# Patient Record
Sex: Female | Born: 1955 | Race: White | Hispanic: No | Marital: Married | Smoking: Never smoker
Health system: Southern US, Community
[De-identification: ages and names within clinical notes are randomized; demographics above are authoritative.]

## PROBLEM LIST (undated history)

## (undated) DIAGNOSIS — I5189 Other ill-defined heart diseases: Secondary | ICD-10-CM

## (undated) DIAGNOSIS — I10 Essential (primary) hypertension: Secondary | ICD-10-CM

## (undated) DIAGNOSIS — R7303 Prediabetes: Secondary | ICD-10-CM

## (undated) DIAGNOSIS — I6789 Other cerebrovascular disease: Secondary | ICD-10-CM

## (undated) DIAGNOSIS — R112 Nausea with vomiting, unspecified: Secondary | ICD-10-CM

## (undated) DIAGNOSIS — N3281 Overactive bladder: Secondary | ICD-10-CM

## (undated) DIAGNOSIS — I4891 Unspecified atrial fibrillation: Secondary | ICD-10-CM

## (undated) DIAGNOSIS — I480 Paroxysmal atrial fibrillation: Secondary | ICD-10-CM

## (undated) DIAGNOSIS — K219 Gastro-esophageal reflux disease without esophagitis: Secondary | ICD-10-CM

## (undated) DIAGNOSIS — I639 Cerebral infarction, unspecified: Secondary | ICD-10-CM

## (undated) DIAGNOSIS — R519 Headache, unspecified: Secondary | ICD-10-CM

## (undated) DIAGNOSIS — F419 Anxiety disorder, unspecified: Secondary | ICD-10-CM

## (undated) DIAGNOSIS — J45909 Unspecified asthma, uncomplicated: Secondary | ICD-10-CM

## (undated) DIAGNOSIS — Z7901 Long term (current) use of anticoagulants: Secondary | ICD-10-CM

## (undated) DIAGNOSIS — I700 Atherosclerosis of aorta: Secondary | ICD-10-CM

## (undated) DIAGNOSIS — G459 Transient cerebral ischemic attack, unspecified: Secondary | ICD-10-CM

## (undated) DIAGNOSIS — N8110 Cystocele, unspecified: Secondary | ICD-10-CM

## (undated) DIAGNOSIS — R600 Localized edema: Secondary | ICD-10-CM

## (undated) DIAGNOSIS — Z79899 Other long term (current) drug therapy: Secondary | ICD-10-CM

## (undated) DIAGNOSIS — K259 Gastric ulcer, unspecified as acute or chronic, without hemorrhage or perforation: Secondary | ICD-10-CM

## (undated) DIAGNOSIS — Z9889 Other specified postprocedural states: Secondary | ICD-10-CM

## (undated) DIAGNOSIS — I471 Supraventricular tachycardia, unspecified: Secondary | ICD-10-CM

## (undated) DIAGNOSIS — G43B0 Ophthalmoplegic migraine, not intractable: Secondary | ICD-10-CM

## (undated) DIAGNOSIS — H409 Unspecified glaucoma: Secondary | ICD-10-CM

## (undated) DIAGNOSIS — K449 Diaphragmatic hernia without obstruction or gangrene: Secondary | ICD-10-CM

## (undated) DIAGNOSIS — F32A Depression, unspecified: Secondary | ICD-10-CM

## (undated) DIAGNOSIS — J189 Pneumonia, unspecified organism: Secondary | ICD-10-CM

## (undated) DIAGNOSIS — D649 Anemia, unspecified: Secondary | ICD-10-CM

## (undated) DIAGNOSIS — I499 Cardiac arrhythmia, unspecified: Secondary | ICD-10-CM

## (undated) DIAGNOSIS — K760 Fatty (change of) liver, not elsewhere classified: Secondary | ICD-10-CM

## (undated) HISTORY — DX: Cystocele, unspecified: N81.10

## (undated) HISTORY — PX: CYSTECTOMY: SUR359

## (undated) HISTORY — DX: Prediabetes: R73.03

## (undated) HISTORY — PX: ESOPHAGOGASTRODUODENOSCOPY: SHX1529

## (undated) HISTORY — PX: OTHER SURGICAL HISTORY: SHX169

## (undated) HISTORY — PX: NASAL SEPTUM SURGERY: SHX37

## (undated) HISTORY — DX: Unspecified glaucoma: H40.9

## (undated) HISTORY — DX: Essential (primary) hypertension: I10

## (undated) HISTORY — DX: Gastric ulcer, unspecified as acute or chronic, without hemorrhage or perforation: K25.9

## (undated) HISTORY — PX: APPENDECTOMY: SHX54

## (undated) HISTORY — DX: Overactive bladder: N32.81

## (undated) HISTORY — PX: EYE SURGERY: SHX253

---

## 1991-07-12 HISTORY — PX: ABDOMINAL HYSTERECTOMY: SHX81

## 1992-07-11 HISTORY — PX: LOWER LEG SOFT TISSUE TUMOR EXCISION: SUR553

## 1998-02-11 ENCOUNTER — Ambulatory Visit (HOSPITAL_COMMUNITY): Admission: RE | Admit: 1998-02-11 | Discharge: 1998-02-11 | Payer: Self-pay | Admitting: Gastroenterology

## 1999-01-28 ENCOUNTER — Ambulatory Visit (HOSPITAL_COMMUNITY): Admission: RE | Admit: 1999-01-28 | Discharge: 1999-01-28 | Payer: Self-pay | Admitting: Orthopedic Surgery

## 1999-01-28 ENCOUNTER — Encounter: Payer: Self-pay | Admitting: Orthopedic Surgery

## 1999-07-14 ENCOUNTER — Other Ambulatory Visit: Admission: RE | Admit: 1999-07-14 | Discharge: 1999-07-14 | Payer: Self-pay | Admitting: Obstetrics & Gynecology

## 1999-09-27 ENCOUNTER — Encounter (INDEPENDENT_AMBULATORY_CARE_PROVIDER_SITE_OTHER): Payer: Self-pay

## 1999-09-28 ENCOUNTER — Inpatient Hospital Stay (HOSPITAL_COMMUNITY): Admission: EM | Admit: 1999-09-28 | Discharge: 1999-09-30 | Payer: Self-pay | Admitting: Emergency Medicine

## 2000-03-27 ENCOUNTER — Encounter: Payer: Self-pay | Admitting: Family Medicine

## 2000-03-27 ENCOUNTER — Ambulatory Visit (HOSPITAL_COMMUNITY): Admission: RE | Admit: 2000-03-27 | Discharge: 2000-03-27 | Payer: Self-pay | Admitting: Family Medicine

## 2001-02-23 ENCOUNTER — Other Ambulatory Visit: Admission: RE | Admit: 2001-02-23 | Discharge: 2001-02-23 | Payer: Self-pay | Admitting: Family Medicine

## 2004-04-16 ENCOUNTER — Ambulatory Visit (HOSPITAL_COMMUNITY): Admission: RE | Admit: 2004-04-16 | Discharge: 2004-04-16 | Payer: Self-pay | Admitting: Family Medicine

## 2004-07-10 ENCOUNTER — Emergency Department (HOSPITAL_COMMUNITY): Admission: EM | Admit: 2004-07-10 | Discharge: 2004-07-10 | Payer: Self-pay | Admitting: Family Medicine

## 2004-08-31 ENCOUNTER — Other Ambulatory Visit: Admission: RE | Admit: 2004-08-31 | Discharge: 2004-08-31 | Payer: Self-pay | Admitting: Obstetrics & Gynecology

## 2008-03-07 ENCOUNTER — Encounter: Admission: RE | Admit: 2008-03-07 | Discharge: 2008-03-07 | Payer: Self-pay | Admitting: Family Medicine

## 2008-07-18 ENCOUNTER — Ambulatory Visit: Payer: Self-pay | Admitting: Pulmonary Disease

## 2008-07-18 ENCOUNTER — Telehealth (INDEPENDENT_AMBULATORY_CARE_PROVIDER_SITE_OTHER): Payer: Self-pay | Admitting: *Deleted

## 2008-07-18 DIAGNOSIS — H409 Unspecified glaucoma: Secondary | ICD-10-CM | POA: Insufficient documentation

## 2008-07-18 DIAGNOSIS — J45909 Unspecified asthma, uncomplicated: Secondary | ICD-10-CM | POA: Insufficient documentation

## 2008-07-18 DIAGNOSIS — K219 Gastro-esophageal reflux disease without esophagitis: Secondary | ICD-10-CM | POA: Insufficient documentation

## 2008-07-21 ENCOUNTER — Ambulatory Visit: Payer: Self-pay | Admitting: Cardiology

## 2008-08-13 ENCOUNTER — Ambulatory Visit: Payer: Self-pay | Admitting: Internal Medicine

## 2008-08-13 DIAGNOSIS — R059 Cough, unspecified: Secondary | ICD-10-CM | POA: Insufficient documentation

## 2008-08-13 DIAGNOSIS — R05 Cough: Secondary | ICD-10-CM | POA: Insufficient documentation

## 2008-08-22 ENCOUNTER — Telehealth (INDEPENDENT_AMBULATORY_CARE_PROVIDER_SITE_OTHER): Payer: Self-pay | Admitting: *Deleted

## 2008-08-27 ENCOUNTER — Ambulatory Visit: Payer: Self-pay | Admitting: Internal Medicine

## 2008-09-01 ENCOUNTER — Encounter: Payer: Self-pay | Admitting: Internal Medicine

## 2008-11-17 ENCOUNTER — Ambulatory Visit: Payer: Self-pay | Admitting: Internal Medicine

## 2008-11-17 DIAGNOSIS — J310 Chronic rhinitis: Secondary | ICD-10-CM | POA: Insufficient documentation

## 2010-01-06 ENCOUNTER — Telehealth: Payer: Self-pay | Admitting: Internal Medicine

## 2010-02-05 ENCOUNTER — Ambulatory Visit: Payer: Self-pay | Admitting: Internal Medicine

## 2010-08-10 NOTE — Progress Notes (Signed)
Summary: nos appt  Phone Note Call from Patient   Caller: [email or web address]  Call For: Aiya Keach Summary of Call: Rsc nos from 6/28 to 7/29 @ 11:45a. Initial call taken by: Darletta Moll,  January 06, 2010 10:34 AM

## 2010-08-10 NOTE — Assessment & Plan Note (Signed)
Summary: Pulmonary/ ext f/u with hfa teaching 50%   Copy to:  Dr. Elvera Lennox Primary Provider/Referring Provider:  Dr. Elvera Lennox  CC:  Followup for medication refill.  Pt states that her breathing has been great and denies any complaints today.Brandy Love  History of Present Illness: 89  yowf never smoker with episodes of bronchitis every winter and maybe in the fall all her adult life but never any chronic problems and in between spells did great with no meds,  previously  spells rx clariton, short course prednisone, otc allergy drugs abx resolve after 1-4 week but gained 50 lbs since 2006.   July 18, 2008 ov to eval difficulty breathing assoc with severe  persitent  rattling cough  that developed abruptly while at [place]  01/12/08  after traveled by car assoc with nasal congestion and overt acid heartburn and had to double aciphex helps some with wu at [facility] with barium swallow neg .  symptoms  worse with cold air but also while sleeping but not exacerbating early in am.   Pos sinus ct rx augmentin x 3 week and start symbicort 160 > reduced to 80 2bid once better.  Nov 17, 2008 cc one week h/o sneezing and  dry coughing while picking strawberries then started green mucus 5/9.  also noted increase sob and  need for rescue rx including neb one day prior to ov. comes to ov with empty symbicort 160 but thinks she's been using the 80 at home.  rec 80 2 puffs first thing  in am and 2 puffs again in pm about 12 hours later and work on hfa technique  February 06, 2010 Followup for medication refill.  Pt states that her breathing has been great and denies any complaints today. Pt denies any significant sore throat, dysphagia, itching, sneezing,  nasal congestion or excess secretions,  fever, chills, sweats, unintended wt loss, pleuritic or exertional cp, hempoptysis, change in activity tolerance  orthopnea pnd or leg swelling. Pt also denies any obvious fluctuation in symptoms with weather or environmental  change or other alleviating or aggravating factors.        Current Medications (verified): 1)  Cymbalta 60 Mg Cpep (Duloxetine Hcl) .Brandy Love.. 1 Once Daily 2)  Dexilant 60 Mg Cpdr (Dexlansoprazole) .Brandy Love.. 1 Once Daily 3)  Lumigan 0.01 % Soln (Bimatoprost) .Brandy Love.. 1 Drop Each Eye At Bedtime 4)  Symbicort 80-4.5 Mcg/act  Aero (Budesonide-Formoterol Fumarate) .... 2 Puffs First Thing  in Am and 2 Puffs Again in Pm About 12 Hours Later 5)  Hydrochlorothiazide 12.5 Mg Caps (Hydrochlorothiazide) .Brandy Love.. 1 Once Daily 6)  Xanax 0.5 Mg Tabs (Alprazolam) .Brandy Love.. 1 Once Daily As Needed Anxiety 7)  Xopenex 1.25 Mg/40ml Nebu (Levalbuterol Hcl) .Brandy Love.. 1 Vial in Nebulizer Every 4 Hours If Needed 8)  Benzonatate 200 Mg Caps (Benzonatate) .Brandy Love.. 1 Once Daily As Needed For Cough 9)  Proventil Hfa 108 (90 Base) Mcg/act Aers (Albuterol Sulfate) .... 2 Puffs Every 4 Hours As Needed  Allergies (verified): 1)  ! Iodine 2)  ! Motrin  Past History:  Past Medical History: ASTHMA (ICD-493.90)   - HFA 50% effective Nov 17, 2008  > February 05, 2010 =50% GLAUCOMA, BILATERAL (ICD-365.9) Sinusitis    - Sinus CT pos 07/21/2008    - ENT eval Pollyann Kennedy 09/01/08 rec nasal steroids and saline prn  Vital Signs:  Patient profile:   55 year old female Weight:      253.50 pounds BMI:     39.85 O2  Sat:      99 % on Room air Temp:     98.1 degrees F oral Pulse rate:   91 / minute BP sitting:   144 / 90  (left arm) Cuff size:   large  Vitals Entered By: Vernie Murders (February 05, 2010 11:53 AM)  O2 Flow:  Room air  Physical Exam  Additional Exam:  wt  246 July 18, 2008  > 247 August 13, 2008 > 250 08/27/08 > 247 Nov 17, 2008 > 253 February 06, 2010  Ambulatory minimally anxious wf  in no acute distress   HEENT: nl dentition, mild nonspecific swelling of both nasal turbinates, and nl  orophanx. Nl external ear canals without cough reflex Neck without JVD/Nodes/TM Lungs trace late exp wheeze  bilaterally without cough on insp or exp maneuvers    RRR no s3 or murmur or increase in P2 Abd soft and benign with nl excursion in the supine position. No bruits or organomegaly Ext warm without calf tenderness, cyanosis clubbing or edema Skin warm and dry without lesions     Impression & Recommendations:  Problem # 1:  ASTHMA (ICD-493.90) All goals of asthma met including optimal function and elimination of symptoms with minimum need for rescue therapy. Contingencies discussed today including the rule of two's.   I spent extra time with the patient today explaining optimal mdi  technique.  This improved from  25-50% but no better with coaching - since she's doing so well though is probably getting adequate delivery so no change in rx  Medications Added to Medication List This Visit: 1)  Cymbalta 60 Mg Cpep (Duloxetine hcl) .Brandy Love.. 1 once daily 2)  Dexilant 60 Mg Cpdr (Dexlansoprazole) .Brandy Love.. 1 once daily 3)  Lumigan 0.01 % Soln (Bimatoprost) .Brandy Love.. 1 drop each eye at bedtime  Other Orders: Est. Patient Level IV (57846)  Patient Instructions: 1)  Work on inhaler technique:  relax and blow all the way out then take a nice smooth deep breath back in, triggering the inhaler at same time you start breathing in and hold for about 5 secs and then brush teeth and gargle 2)  Symbicort 80 2 puffs first thing  in am and 2 puffs again in pm about 12 hours later  3)  If your breathing worsens or you need to use your rescue inhaler more than twice weekly or wake up more than twice a month with any respiratory symptoms or require more than two rescue inhalers per year, we need to see you right away. 4)    Prescriptions: SYMBICORT 80-4.5 MCG/ACT  AERO (BUDESONIDE-FORMOTEROL FUMARATE) 2 puffs first thing  in am and 2 puffs again in pm about 12 hours later  #1 x 11   Entered and Authorized by:   Nyoka Cowden MD   Signed by:   Nyoka Cowden MD on 02/05/2010   Method used:   Electronically to        [facility]* (retail)       [address] [place], [place]  [address]       Ph: [phone] or [phone]       Fax: [phone]   RxID:   [id number]

## 2010-09-07 ENCOUNTER — Emergency Department (HOSPITAL_COMMUNITY): Payer: 59

## 2010-09-07 ENCOUNTER — Emergency Department (HOSPITAL_COMMUNITY)
Admission: EM | Admit: 2010-09-07 | Discharge: 2010-09-07 | Disposition: A | Discharge status: Home / Self Care | Payer: 59 | Attending: Emergency Medicine | Admitting: Emergency Medicine

## 2010-09-07 DIAGNOSIS — R197 Diarrhea, unspecified: Secondary | ICD-10-CM | POA: Insufficient documentation

## 2010-09-07 DIAGNOSIS — R000 Tachycardia, unspecified: Secondary | ICD-10-CM | POA: Insufficient documentation

## 2010-09-07 DIAGNOSIS — R1011 Right upper quadrant pain: Secondary | ICD-10-CM | POA: Insufficient documentation

## 2010-09-07 DIAGNOSIS — H409 Unspecified glaucoma: Secondary | ICD-10-CM | POA: Insufficient documentation

## 2010-09-07 DIAGNOSIS — E669 Obesity, unspecified: Secondary | ICD-10-CM | POA: Insufficient documentation

## 2010-09-07 DIAGNOSIS — J45909 Unspecified asthma, uncomplicated: Secondary | ICD-10-CM | POA: Insufficient documentation

## 2010-09-07 DIAGNOSIS — R112 Nausea with vomiting, unspecified: Secondary | ICD-10-CM | POA: Insufficient documentation

## 2010-09-07 LAB — DIFFERENTIAL
Basophils Absolute: 0 10*3/uL (ref 0.0–0.1)
Basophils Relative: 0 % (ref 0–1)
Eosinophils Absolute: 0 10*3/uL (ref 0.0–0.7)
Eosinophils Relative: 0 % (ref 0–5)
Monocytes Absolute: 0.5 10*3/uL (ref 0.1–1.0)
Monocytes Relative: 8 % (ref 3–12)
Neutro Abs: 5.2 10*3/uL (ref 1.7–7.7)

## 2010-09-07 LAB — URINALYSIS, ROUTINE W REFLEX MICROSCOPIC
Hgb urine dipstick: NEGATIVE
Ketones, ur: NEGATIVE mg/dL
Leukocytes, UA: NEGATIVE
Nitrite: NEGATIVE
Protein, ur: 30 mg/dL — AB
Specific Gravity, Urine: 1.024 (ref 1.005–1.030)
Urine Glucose, Fasting: NEGATIVE mg/dL
Urobilinogen, UA: 0.2 mg/dL (ref 0.0–1.0)
pH: 6 (ref 5.0–8.0)

## 2010-09-07 LAB — URINE MICROSCOPIC-ADD ON

## 2010-09-07 LAB — CBC
Hemoglobin: 12.9 g/dL (ref 12.0–15.0)
MCH: 26.3 pg (ref 26.0–34.0)
MCHC: 33 g/dL (ref 30.0–36.0)
RDW: 14.7 % (ref 11.5–15.5)

## 2010-09-07 LAB — COMPREHENSIVE METABOLIC PANEL
AST: 22 U/L (ref 0–37)
CO2: 26 mEq/L (ref 19–32)
Calcium: 8.9 mg/dL (ref 8.4–10.5)
Creatinine, Ser: 0.81 mg/dL (ref 0.4–1.2)
GFR calc Af Amer: >60 mL/min (ref >60)
GFR calc non Af Amer: >60 mL/min (ref >60)
Sodium: 137 mEq/L (ref 135–145)
Total Protein: 6.8 g/dL (ref 6.0–8.3)

## 2010-09-07 LAB — LIPASE, BLOOD: Lipase: 19 U/L (ref 11–59)

## 2012-03-30 ENCOUNTER — Other Ambulatory Visit: Payer: Self-pay | Admitting: Urology

## 2012-04-06 ENCOUNTER — Encounter (HOSPITAL_BASED_OUTPATIENT_CLINIC_OR_DEPARTMENT_OTHER): Payer: Self-pay | Admitting: *Deleted

## 2012-04-06 NOTE — Progress Notes (Signed)
To [facility] at 0615-Npo after mn- Istat,possible Ekg on arrival-will take symbicort,dexalan with sip water that am-to bring rescue inhaler.

## 2012-04-09 NOTE — H&P (Signed)
History of Present Illness   [name] has mixed stress urge incontinence but a more predominant urge component with frequency. She was 80-90% better with a PNE on the right side. She said she only leaked once. She is going less frequently. She does have a stable cystocele.  Review of systems: No change in bowel or neurologic status.   Today her dressing was removed as were the leads. She had a little bit of irritation from the dressing. Review of systems: No change in bowel or neurologic status.    Past Medical History Problems  1. History of  Allergic Rhinitis 477.9 2. History of  Asthma 493.90 3. 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 History of  Edema 782.3 5. History of  Esophageal Reflux 530.81 6. History of  Glaucoma 365.9 7. 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 History of  Hyperlipidemia 272.4 9. History of  Obesity 278.00  Surgical History Problems  1. History of  Ankle Surgery 2. History of  Appendectomy 3. History of  Cesarean Section 4. History of  Hysterectomy V45.77  Current Meds 1. Cephalexin 500 MG Oral Capsule; TAKE 1 CAPSULE 3 TIMES DAILY; Therapy: 02Aug2013 to  (Evaluate:05Aug2013)  Requested for: 02Aug2013; Last Rx:02Aug2013 2. Dexilant 60 MG Oral Capsule Delayed Release; Therapy: 10Apr2013 to 3. Ditropan XL 10 MG TBCR; Therapy: (Recorded:10Jun2013) to 4. Hydrochlorothiazide 12.5 MG Oral Capsule; Therapy: 27Feb2013 to 5. HydrOXYzine HCl 10 MG Oral Tablet; Therapy: (Recorded:10Jun2013) to 6. Latanoprost 0.005 % Ophthalmic Solution; Therapy: 30Jan2013 to 7. Oxybutynin Chloride ER 10 MG Oral Tablet Extended Release 24 Hour; Therapy: 27Feb2013 to 8. Proventil HFA 108 (90 Base) MCG/ACT Inhalation Aerosol Solution; Therapy:  (Recorded:10Jun2013) to 9. Sertraline HCl 100 MG Oral Tablet; Therapy: 25Sep2012 to 10. Symbicort 80-4.5 MCG/ACT Inhalation Aerosol; Therapy: 10Apr2013 to 11. Toviaz 8 MG Oral Tablet Extended Release 24 Hour; Therapy:  28May2013 to  Allergies Medication  1. Erythromycin Derivatives 2. Shellfish-derived Products  Family History Problems  1. Maternal history of  Asthma V17.5 2. Paternal history of  Cataract 3. Maternal history of  Diabetes Mellitus V18.0 4. Paternal history of  Heart Disease V17.49 5. Paternal history of  Prostate Cancer V16.42  Social History Problems  1. Former Smoker V15.82 2. 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 History of  Tobacco Use  Vitals Vital Signs [Data Includes: Last 1 Day]  17Sep2013 01:16PM  Blood Pressure: 146 / 80 Temperature: 98.5 F Heart Rate: 78  Assessment Assessed  1. Cystocele 596.89 2. Urge And Stress Incontinence 788.33  Plan Urinary Frequency (788.41)  1. Hydrocortisone 0.5 % External Cream; APPLY SPARINGLY TO AFFECTED AREA(S) TWICE  DAILY; Therapy: 17Sep2013 to (Last Rx:17Sep2013)  Discussion/Summary   [name] would like to proceed with the Interstim. Hopefully it will reach her long-term treatment goals.   [name] may have had a little reaction to the adhesive tape. Gave her Hydrocortisone cream 0.5% to be used if necessary. We will schedule her for the Interstim.  After a thorough review of the management options for the patient's condition the patient  elected to proceed with surgical therapy as noted above. We have discussed the potential benefits and risks of the procedure, side effects of the proposed treatment, the likelihood of the patient achieving the goals of the procedure, and any potential problems that might occur during the procedure or recuperation. Informed consent has been obtained.

## 2012-04-09 NOTE — Anesthesia Preprocedure Evaluation (Addendum)
Anesthesia Evaluation  Patient identified by MRN, date of birth, ID band Patient awake    Reviewed: Allergy & Precautions, H&P , NPO status , Patient's Chart, lab work & pertinent test results  Airway Mallampati: II TM Distance: >3 FB Neck ROM: Full    Dental No notable dental hx.    Pulmonary asthma ,  breath sounds clear to auscultation  Pulmonary exam normal       Cardiovascular negative cardio ROS  Rhythm:Regular Rate:Normal     Neuro/Psych negative neurological ROS  negative psych ROS   GI/Hepatic Neg liver ROS, GERD-  ,  Endo/Other  Morbid obesity  Renal/GU negative Renal ROS  negative genitourinary   Musculoskeletal negative musculoskeletal ROS (+)   Abdominal (+) + obese,   Peds negative pediatric ROS (+)  Hematology negative hematology ROS (+)   Anesthesia Other Findings   Reproductive/Obstetrics negative OB ROS                           Anesthesia Physical Anesthesia Plan  ASA: III  Anesthesia Plan: MAC   Post-op Pain Management:    Induction:   Airway Management Planned:   Additional Equipment:   Intra-op Plan:   Post-operative Plan: Extubation in OR  Informed Consent: I have reviewed the patients History and Physical, chart, labs and discussed the procedure including the risks, benefits and alternatives for the proposed anesthesia with the patient or authorized representative who has indicated his/her understanding and acceptance.   Dental advisory given  Plan Discussed with: CRNA  Anesthesia Plan Comments:        Anesthesia Quick Evaluation

## 2012-04-10 ENCOUNTER — Encounter (HOSPITAL_BASED_OUTPATIENT_CLINIC_OR_DEPARTMENT_OTHER): Payer: Self-pay | Admitting: Anesthesiology

## 2012-04-10 ENCOUNTER — Ambulatory Visit (HOSPITAL_COMMUNITY): Payer: 59

## 2012-04-10 ENCOUNTER — Encounter (HOSPITAL_BASED_OUTPATIENT_CLINIC_OR_DEPARTMENT_OTHER): Payer: Self-pay | Admitting: *Deleted

## 2012-04-10 ENCOUNTER — Ambulatory Visit (HOSPITAL_BASED_OUTPATIENT_CLINIC_OR_DEPARTMENT_OTHER): Payer: 59 | Admitting: Anesthesiology

## 2012-04-10 ENCOUNTER — Encounter (HOSPITAL_BASED_OUTPATIENT_CLINIC_OR_DEPARTMENT_OTHER)
Admission: RE | Disposition: A | Discharge status: Home / Self Care | Payer: Self-pay | Source: Ambulatory Visit | Attending: Urology

## 2012-04-10 ENCOUNTER — Ambulatory Visit (HOSPITAL_BASED_OUTPATIENT_CLINIC_OR_DEPARTMENT_OTHER)
Admission: RE | Admit: 2012-04-10 | Discharge: 2012-04-10 | Disposition: A | Discharge status: Home / Self Care | Payer: 59 | Source: Ambulatory Visit | Attending: Urology | Admitting: Urology

## 2012-04-10 DIAGNOSIS — N3941 Urge incontinence: Secondary | ICD-10-CM | POA: Insufficient documentation

## 2012-04-10 DIAGNOSIS — E785 Hyperlipidemia, unspecified: Secondary | ICD-10-CM | POA: Insufficient documentation

## 2012-04-10 DIAGNOSIS — R350 Frequency of micturition: Secondary | ICD-10-CM | POA: Insufficient documentation

## 2012-04-10 DIAGNOSIS — E669 Obesity, unspecified: Secondary | ICD-10-CM | POA: Insufficient documentation

## 2012-04-10 DIAGNOSIS — K219 Gastro-esophageal reflux disease without esophagitis: Secondary | ICD-10-CM | POA: Insufficient documentation

## 2012-04-10 DIAGNOSIS — Z79899 Other long term (current) drug therapy: Secondary | ICD-10-CM | POA: Insufficient documentation

## 2012-04-10 HISTORY — DX: Gastro-esophageal reflux disease without esophagitis: K21.9

## 2012-04-10 HISTORY — DX: Unspecified asthma, uncomplicated: J45.909

## 2012-04-10 HISTORY — PX: INTERSTIM IMPLANT PLACEMENT: SHX5130

## 2012-04-10 HISTORY — DX: Localized edema: R60.0

## 2012-04-10 LAB — POCT I-STAT, CHEM 8
Calcium, Ion: 1.17 mmol/L (ref 1.12–1.23)
Chloride: 108 mEq/L (ref 96–112)
Glucose, Bld: 114 mg/dL — ABNORMAL HIGH (ref 70–99)
HCT: 37 % (ref 36.0–46.0)
Hemoglobin: 12.6 g/dL (ref 12.0–15.0)

## 2012-04-10 SURGERY — INSERTION, SACRAL NERVE STIMULATOR, INTERSTIM, STAGE 1
Anesthesia: Monitor Anesthesia Care | Site: Back | Wound class: Clean Contaminated

## 2012-04-10 MED ORDER — HYDROCODONE-ACETAMINOPHEN 5-500 MG PO TABS: 1.0000 | ORAL_TABLET | Freq: Four times a day (QID) | ORAL | Status: DC | PRN

## 2012-04-10 MED ORDER — BUPIVACAINE-EPINEPHRINE 0.5% -1:200000 IJ SOLN
INTRAMUSCULAR | Status: DC | PRN
  Administered 2012-04-10: 22 mL

## 2012-04-10 MED ORDER — MIDAZOLAM HCL 5 MG/5ML IJ SOLN
INTRAMUSCULAR | Status: DC | PRN
  Administered 2012-04-10: 1 mg via INTRAVENOUS
  Administered 2012-04-10 (×2): 0.5 mg via INTRAVENOUS
  Administered 2012-04-10: 2 mg via INTRAVENOUS

## 2012-04-10 MED ORDER — CEFAZOLIN SODIUM 1-5 GM-% IV SOLN: 1.0000 g | INTRAVENOUS | Status: DC

## 2012-04-10 MED ORDER — LIDOCAINE HCL (CARDIAC) 20 MG/ML IV SOLN
INTRAVENOUS | Status: DC | PRN
  Administered 2012-04-10: 75 mg via INTRAVENOUS

## 2012-04-10 MED ORDER — PROMETHAZINE HCL 25 MG/ML IJ SOLN: 6.2500 mg | INTRAMUSCULAR | Status: DC | PRN

## 2012-04-10 MED ORDER — LACTATED RINGERS IV SOLN
INTRAVENOUS | Status: DC | PRN
  Administered 2012-04-10 (×2): via INTRAVENOUS

## 2012-04-10 MED ORDER — LIDOCAINE-EPINEPHRINE (PF) 1 %-1:200000 IJ SOLN
INTRAMUSCULAR | Status: DC | PRN
  Administered 2012-04-10: 22 mL

## 2012-04-10 MED ORDER — FENTANYL CITRATE 0.05 MG/ML IJ SOLN: 25.0000 ug | INTRAMUSCULAR | Status: DC | PRN

## 2012-04-10 MED ORDER — PROPOFOL 10 MG/ML IV EMUL
INTRAVENOUS | Status: DC | PRN
  Administered 2012-04-10: 50 ug/kg/min via INTRAVENOUS

## 2012-04-10 MED ORDER — FENTANYL CITRATE 0.05 MG/ML IJ SOLN
INTRAMUSCULAR | Status: DC | PRN
  Administered 2012-04-10 (×2): 12.5 ug via INTRAVENOUS
  Administered 2012-04-10: 25 ug via INTRAVENOUS
  Administered 2012-04-10 (×4): 12.5 ug via INTRAVENOUS

## 2012-04-10 MED ORDER — ONDANSETRON HCL 4 MG/2ML IJ SOLN
INTRAMUSCULAR | Status: DC | PRN
  Administered 2012-04-10: 4 mg via INTRAVENOUS

## 2012-04-10 MED ORDER — HYDROCODONE-ACETAMINOPHEN 5-325 MG PO TABS
1.0000 | ORAL_TABLET | ORAL | Status: DC | PRN
  Administered 2012-04-10: 1 via ORAL

## 2012-04-10 MED ORDER — CEPHALEXIN 250 MG PO CAPS: 250.0000 mg | ORAL_CAPSULE | Freq: Three times a day (TID) | ORAL | Status: DC

## 2012-04-10 MED ORDER — CEFAZOLIN SODIUM-DEXTROSE 2-3 GM-% IV SOLR
INTRAVENOUS | Status: DC | PRN
  Administered 2012-04-10: 2 g via INTRAVENOUS

## 2012-04-10 MED ORDER — PROPOFOL 10 MG/ML IV BOLUS
INTRAVENOUS | Status: DC | PRN
  Administered 2012-04-10: 20 mg via INTRAVENOUS
  Administered 2012-04-10: 10 mg via INTRAVENOUS

## 2012-04-10 MED ORDER — CEFAZOLIN SODIUM-DEXTROSE 2-3 GM-% IV SOLR: 2.0000 g | INTRAVENOUS | Status: DC

## 2012-04-10 MED ORDER — LACTATED RINGERS IV SOLN
INTRAVENOUS | Status: DC
  Administered 2012-04-10: 07:00:00 via INTRAVENOUS

## 2012-04-10 SURGICAL SUPPLY — 55 items
BAG URINE DRAINAGE (UROLOGICAL SUPPLIES) ×2 IMPLANT
BAG URINE LEG 500ML (DRAIN) IMPLANT
BANDAGE ADHESIVE 1X3 (GAUZE/BANDAGES/DRESSINGS) IMPLANT
BENZOIN TINCTURE PRP APPL 2/3 (GAUZE/BANDAGES/DRESSINGS) IMPLANT
BLADE HEX COATED 2.75 (ELECTRODE) ×2 IMPLANT
BLADE SURG 15 STRL LF DISP TIS (BLADE) ×1 IMPLANT
BLADE SURG 15 STRL SS (BLADE) ×1
CATH FOLEY 2WAY SLVR  5CC 16FR (CATHETERS) ×1
CATH FOLEY 2WAY SLVR 5CC 16FR (CATHETERS) ×1 IMPLANT
CLOTH BEACON ORANGE TIMEOUT ST (SAFETY) ×2 IMPLANT
COVER MAYO STAND STRL (DRAPES) ×2 IMPLANT
COVER PROBE W GEL 5X96 (DRAPES) ×2 IMPLANT
COVER TABLE BACK 60X90 (DRAPES) ×2 IMPLANT
DERMABOND ADVANCED (GAUZE/BANDAGES/DRESSINGS) ×1
DERMABOND ADVANCED .7 DNX12 (GAUZE/BANDAGES/DRESSINGS) ×1 IMPLANT
DRAPE C-ARM 42X72 X-RAY (DRAPES) ×2 IMPLANT
DRAPE INCISE 23X17 IOBAN STRL (DRAPES) ×1
DRAPE INCISE IOBAN 23X17 STRL (DRAPES) ×1 IMPLANT
DRAPE LAPAROSCOPIC ABDOMINAL (DRAPES) ×2 IMPLANT
DRAPE LG THREE QUARTER DISP (DRAPES) ×2 IMPLANT
DRAPE POUCH INSTRU U-SHP 10X18 (DRAPES) ×2 IMPLANT
DRAPE SURG 17X23 STRL (DRAPES) ×4 IMPLANT
DRESSING TELFA 8X3 (GAUZE/BANDAGES/DRESSINGS) ×4 IMPLANT
DRSG TEGADERM 4X4.75 (GAUZE/BANDAGES/DRESSINGS) ×2 IMPLANT
ELECT REM PT RETURN 9FT ADLT (ELECTROSURGICAL) ×2
ELECTRODE REM PT RTRN 9FT ADLT (ELECTROSURGICAL) ×1 IMPLANT
GAUZE SPONGE 4X4 12PLY STRL LF (GAUZE/BANDAGES/DRESSINGS) IMPLANT
GLOVE BIO SURGEON STRL SZ7.5 (GLOVE) ×2 IMPLANT
GLOVE ECLIPSE 6.5 STRL STRAW (GLOVE) ×6 IMPLANT
GOWN PREVENTION PLUS LG XLONG (DISPOSABLE) ×2 IMPLANT
GOWN STRL REIN XL XLG (GOWN DISPOSABLE) ×2 IMPLANT
HOLDER FOLEY CATH W/STRAP (MISCELLANEOUS) ×2 IMPLANT
INTRODUCER GUIDE DILATR SHEATH (SET/KITS/TRAYS/PACK) ×2 IMPLANT
LEAD (Lead) ×2 IMPLANT
NEEDLE FORAMEN 20GA 5  12.5CM (NEEDLE) ×2 IMPLANT
NEEDLE HYPO 22GX1.5 SAFETY (NEEDLE) ×2 IMPLANT
PACK BASIN DAY SURGERY FS (CUSTOM PROCEDURE TRAY) ×2 IMPLANT
PENCIL BUTTON HOLSTER BLD 10FT (ELECTRODE) IMPLANT
PROGRAMMER ANTENNA EXT (UROLOGICAL SUPPLIES) ×2 IMPLANT
PROGRAMMER STIMUL 2.2X1.1X3.7 (UROLOGICAL SUPPLIES) ×2 IMPLANT
SPONGE GAUZE 4X4 12PLY (GAUZE/BANDAGES/DRESSINGS) ×2 IMPLANT
STAPLER VISISTAT 35W (STAPLE) IMPLANT
STIMULATOR INTERSTIM 2X1.7X.3 (Orthopedic Implant) ×2 IMPLANT
STRIP CLOSURE SKIN 1/2X4 (GAUZE/BANDAGES/DRESSINGS) ×2 IMPLANT
SUT SILK 2 0 (SUTURE) ×1
SUT SILK 2-0 18XBRD TIE 12 (SUTURE) ×1 IMPLANT
SUT VIC AB 3-0 SH 27 (SUTURE) ×2
SUT VIC AB 3-0 SH 27X BRD (SUTURE) ×2 IMPLANT
SUT VICRYL 4-0 PS2 18IN ABS (SUTURE) ×4 IMPLANT
SYR BULB IRRIGATION 50ML (SYRINGE) ×2 IMPLANT
SYR CONTROL 10ML LL (SYRINGE) ×4 IMPLANT
SYRINGE 10CC LL (SYRINGE) ×2 IMPLANT
TOWEL OR 17X24 6PK STRL BLUE (TOWEL DISPOSABLE) ×4 IMPLANT
TRAY DSU PREP LF (CUSTOM PROCEDURE TRAY) ×2 IMPLANT
WATER STERILE IRR 500ML POUR (IV SOLUTION) ×2 IMPLANT

## 2012-04-10 NOTE — Progress Notes (Signed)
Dr. Sherron Monday in and discussed d/c instructions w/ pt and spouse.

## 2012-04-10 NOTE — Transfer of Care (Signed)
Immediate Anesthesia Transfer of Care Note  Patient: Brandy Love  Procedure(s) Performed: Procedure(s) (LRB): INTERSTIM IMPLANT FIRST STAGE (N/A) INTERSTIM IMPLANT SECOND STAGE (N/A)  Patient Location: PACU  Anesthesia Type: General  Level of Consciousness: awake, sedated, patient cooperative and responds to stimulation  Airway & Oxygen Therapy: Patient Spontanous Breathing and Patient connected to face mask oxygen then RA O2  Post-op Assessment: Report given to PACU RN, Post -op Vital signs reviewed and stable and Patient moving all extremities  Post vital signs: Reviewed and stable  Complications: No apparent anesthesia complications

## 2012-04-10 NOTE — Anesthesia Procedure Notes (Signed)
Procedure Name: MAC Date/Time: 04/10/2012 7:42 AM Performed by: Jessica Priest Pre-anesthesia Checklist: Patient identified, Emergency Drugs available, Suction available, Patient being monitored and Timeout performed Patient Re-evaluated:Patient Re-evaluated prior to inductionOxygen Delivery Method: Simple face mask Preoxygenation: Pre-oxygenation with 100% oxygen Placement Confirmation: positive ETCO2 and breath sounds checked- equal and bilateral

## 2012-04-10 NOTE — Interval H&P Note (Signed)
History and Physical Interval Note:  04/10/2012 7:38 AM  Brandy Love  has presented today for surgery, with the diagnosis of urge incontinence  The various methods of treatment have been discussed with the patient and family. After consideration of risks, benefits and other options for treatment, the patient has consented to  Procedure(s) (LRB) with comments: INTERSTIM IMPLANT FIRST STAGE (N/A) - rad tech ok per vicki at main  INTERSTIM IMPLANT SECOND STAGE (N/A) as a surgical intervention .  The patient's history has been reviewed, patient examined, no change in status, stable for surgery.  I have reviewed the patient's chart and labs.  Questions were answered to the patient's satisfaction.     Sukanya Goldblatt A

## 2012-04-10 NOTE — Op Note (Signed)
Preoperative diagnosis: Refractory urinary frequency and urgency incontinence Postoperative diagnosis: Refractory urinary frequency and urgency incontinence Surgery: InterStim placement stage I and 2 and impedance check Surgeon: Dr. Lorin Picket Rin Gorton  The patient has the above diagnoses and consented to the above procedure. Preoperative skin preparation was performed recognizing she had an iodine allergy. Preoperative antibiotics were given  Using fluoroscopy in the AP position I marked the S3 foramina. I anesthetized with approximately 13 cc of a lidocaine epinephrine mixture. I easily located the S3 foramina as planned on the right checked in the AP and lateral position  She had excellent toe response and fluoroscopically we were in S3. Through the 5 inch foramen needle after removing the inner sheath I passed the lead to the appropriate depth. I passed the white trocar to the appropriate depth using the marker. The lead was removed  She had a modest amount of bleeding from the white trocar after removing its inner sheath. It was a little bit more brisk than what one normally sees. I therefore replaced the inner trocar and advanced the sheath for approximately a centimeter and held it for 5 minutes. Because of her obesity the trocar was still in good position. After removing the inner trocar there was little to no bleeding.  I advanced the lead with the usual technique after pulling the inner wire back approximately 1 inch. It went to a very nice depth with S2 and S3 bridging the bone table. She had an excellent toe response at position one 2 and 3. She had minimal to no bellows response. The responses were at very low power settings as low as 1. Under fluoroscopic guidance I removed the white sheath and inner core of the lead not changing its position. Stimulation was then applied with the same responses  We then did AP fluoroscopic view and the lead was in excellent position but rather straight. It  is my opinion this was because I had advanced the white trocar perhaps an extra centimeter or half a centimeter in depth. I did not change position of the lead since she had excellent responses.  With appropriate bony markings and taking her body habitus into consideration I made a 4 cm and later a 5 cm incision in the right upper buttock below the belt line. I used approximately 20 cc of a lidocaine epinephrine mixture to anesthetize the pocket was scalpel and cautery I made an appropriate depth incision and finger dissected and appropriate size cautery. The incision was extended a little bit medial since the lead otherwise may been under minimal tension.  With the tunneling device the lead was brought medial to lateral at the appropriate depth. It was cleaned and attached to the generator with a screwdriver. It was easy laid in the pocket fitting nicely.  As a separate procedure the impedance was checked and was normal on all 4-lead positions  Irrigation with sterile water was applied. I closed the right lateral incision with 3-0 Vicryl running suture followed by 4-0 subcuticular. The medial incision was closed with 4-0 subcuticular making certain that the lead laid in nicely at appropriate depth  The right incision was a little bit oozy so in addition to the Dermabond I applied a small Telfa dressing with an OpSite after the Dermabond dried  I was very pleased with the procedure and hopefully it will reach the patient's treatment goal

## 2012-04-10 NOTE — Anesthesia Postprocedure Evaluation (Signed)
  Anesthesia Post-op Note  Patient: Brandy Love  Procedure(s) Performed: Procedure(s) (LRB): INTERSTIM IMPLANT FIRST STAGE (N/A) INTERSTIM IMPLANT SECOND STAGE (N/A)  Patient Location: PACU  Anesthesia Type: MAC  Level of Consciousness: awake and alert   Airway and Oxygen Therapy: Patient Spontanous Breathing  Post-op Pain: mild  Post-op Assessment: Post-op Vital signs reviewed, Patient's Cardiovascular Status Stable, Respiratory Function Stable, Patent Airway and No signs of Nausea or vomiting  Post-op Vital Signs: stable  Complications: No apparent anesthesia complications

## 2012-04-11 ENCOUNTER — Encounter (HOSPITAL_BASED_OUTPATIENT_CLINIC_OR_DEPARTMENT_OTHER): Payer: Self-pay | Admitting: Urology

## 2013-06-12 ENCOUNTER — Ambulatory Visit (INDEPENDENT_AMBULATORY_CARE_PROVIDER_SITE_OTHER): Payer: 59

## 2013-06-12 VITALS — BP 136/74 | HR 99 | Resp 24 | Ht 67.0 in | Wt 273.0 lb

## 2013-06-12 DIAGNOSIS — M79672 Pain in left foot: Secondary | ICD-10-CM

## 2013-06-12 DIAGNOSIS — M722 Plantar fascial fibromatosis: Secondary | ICD-10-CM

## 2013-06-12 DIAGNOSIS — M79609 Pain in unspecified limb: Secondary | ICD-10-CM

## 2013-06-12 DIAGNOSIS — M7730 Calcaneal spur, unspecified foot: Secondary | ICD-10-CM

## 2013-06-12 DIAGNOSIS — M7732 Calcaneal spur, left foot: Secondary | ICD-10-CM

## 2013-06-12 MED ORDER — TRIAMCINOLONE ACETONIDE 10 MG/ML IJ SUSP: 10.0000 mg | Freq: Once | INTRAMUSCULAR | Status: DC

## 2013-06-12 MED ORDER — MELOXICAM 15 MG PO TABS: 15.0000 mg | ORAL_TABLET | Freq: Every day | ORAL | Status: DC

## 2013-06-12 NOTE — Progress Notes (Signed)
   Subjective:    Patient ID: Brandy Love, female    DOB: [birth year], 57 y.o.   MRN: [id number]  "I think my Plantar Fasciitis has flared up in my left foot."  Foot Pain This is a recurrent problem. Episode onset: 05/11/13. The problem occurs constantly. The problem has been gradually worsening. The symptoms are aggravated by walking and standing (going barefoot). She has tried NSAIDs and acetaminophen (plantar fascial brace, arch support, Aleve) for the symptoms. The treatment provided mild relief.      Review of Systems  Constitutional: Positive for activity change and unexpected weight change.  Eyes: Positive for pain.       Glaucoma   Gastrointestinal:       Acid reflux  Genitourinary: Positive for urgency and frequency.  Allergic/Immunologic: Positive for food allergies.       Shellfish   All other systems reviewed and are negative.       Objective:   Physical Exam Neurovascular status is intact with pedal pulses palpable. Epicritic and proprioceptive sensations intact and symmetric bilateral. Patient is having a flare up with exquisite pain tenderness in the medial calcaneal tubercle left foot x-rays reveal well-developed inferior calcaneal spur thickening of fascial structures. No signs of fracture or other osseous abnormality or noted. Patient wearing a very flimsy hyperflexible curve shoes with a cushioned nonsupportive insole. Patient does have exacerbation of plantar fasciitis however her insurance does not cover orthoses at this time recommendation is for an OTC type orthotic products is power step orthoses which can be furnished. Patient is given information literature about plantar fasciitis and prescription for Mobic and steroid injection to the left heel is provided at this time.       Assessment & Plan:  Assessment plantar fasciitis/heel spur syndrome left foot more so than right with re\re exacerbation. Plan at this time recommended a firm soled shoes such as a  new balance Brooks for [employer]ics walking or running shoe no barefoot or flimsy shoes. At this time patient is placed injection tender with Kenalog in 20 mg Xylocaine plain to the inferior left heel fascial strapping is applied. Recommended ice to the heel every evening also per prescription for Mobic is dispensed 50 no gross once daily reappointed with in one to 2 months for followup and reevaluation possible orthotic adjustment if needed maintain a stable shoe at all times  Alvan Dame DPM

## 2013-06-12 NOTE — Patient Instructions (Signed)

## 2013-06-21 ENCOUNTER — Ambulatory Visit: Payer: Self-pay

## 2013-07-09 ENCOUNTER — Ambulatory Visit (INDEPENDENT_AMBULATORY_CARE_PROVIDER_SITE_OTHER): Payer: 59

## 2013-07-09 VITALS — BP 161/86 | HR 85 | Resp 20 | Ht 67.0 in | Wt 272.0 lb

## 2013-07-09 DIAGNOSIS — M79609 Pain in unspecified limb: Secondary | ICD-10-CM

## 2013-07-09 DIAGNOSIS — M722 Plantar fascial fibromatosis: Secondary | ICD-10-CM

## 2013-07-09 DIAGNOSIS — M79672 Pain in left foot: Secondary | ICD-10-CM

## 2013-07-09 NOTE — Progress Notes (Signed)
   Subjective:    Patient ID: Brandy Love, female    DOB: January 04, 1956, 57 y.o.   MRN: [id number] "It hurts, walking through the woods the other day got me."  HPI patient has a history of plantar fasciitis/heel spur syndrome is using power step orthotics in the past however recently walking wounds and some activities without the supportive shoes that she needed and reaggravated left heel.    Review of Systems  All other systems reviewed and are negative.       Objective:   Physical Exam Neurovascular status is intact. Epicritic and proprioceptive sensations intact normal plantar response DTRs are listed. Patient currently wearing crocs however on palpation there is tenderness in the inferior calcaneal tubercle a medial band plantar fascia left foot. Discussed options and this time based on read exacerbations symptoms due to walking uneven surface recommended that she wear appropriate shoes at all times if she doesn't know which again hot tub would be recommended.      Assessment & Plan:  Assessment this time is read exacerbation plantar fasciitis left fascial strapping applied at this time. Maintain Mobic or Tylenol as needed for pain also ice to the inferior heel areas every evening. Recheck in one month if fails to improve or exacerbate again may be candidate for additional steroid injections if needed  Alvan Dame DPM

## 2013-07-09 NOTE — Patient Instructions (Signed)

## 2013-08-06 ENCOUNTER — Ambulatory Visit (INDEPENDENT_AMBULATORY_CARE_PROVIDER_SITE_OTHER): Payer: 59

## 2013-08-06 ENCOUNTER — Ambulatory Visit: Payer: 59

## 2013-08-06 VITALS — BP 149/77 | HR 81 | Resp 16 | Ht 67.0 in | Wt 275.0 lb

## 2013-08-06 DIAGNOSIS — M79609 Pain in unspecified limb: Secondary | ICD-10-CM

## 2013-08-06 DIAGNOSIS — M722 Plantar fascial fibromatosis: Secondary | ICD-10-CM

## 2013-08-06 MED ORDER — TRIAMCINOLONE ACETONIDE 10 MG/ML IJ SUSP: 10.0000 mg | Freq: Once | INTRAMUSCULAR | Status: DC

## 2013-08-06 NOTE — Progress Notes (Signed)
   Subjective:    Patient ID: Brandy Love, female    DOB: 05-30-1956, 58 y.o.   MRN: [id number]  HPI Comments: plantar fasciitis left foot-it still hurts     Review of Systems no new changes or findings at this visit     Objective:   Physical Exam Neurovascular status is intact pedal pulses palpable continues to have pain medial calcaneal tubercle Magan plantar fascia left heel. Patient does have some good foot orthotics with her which she indicates made her feet more painful she is advised to take as bad patient is also wearing a pair of [employer] athletic shoes which actually her beneficial to her foot well. 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       Assessment & Plan:  Assessment persistent plantar fasciitis/heel spur syndrome injection tender with Kenalog 20 mg Xylocaine plain to the inferior left heel fascial strapping we'll be lysis patient is currently in a power step orthoses we'll to a functional orthotic scan for new functional orthoses and appropriate followup thereafter within 3 or 4 weeks when orthotics ready for fitting also recommended ice to the affected area continue with Mobic only patient been using Mobic and Aleve suggested to stop bleeding switch to Tylenol for any additional pain medications and use Mobic alone reappointed in 4-5 weeks for orthotic pickup fascial strapping was also applied after today's visit.  Harriet Masson DPM

## 2013-08-06 NOTE — Patient Instructions (Signed)

## 2013-09-13 ENCOUNTER — Ambulatory Visit (INDEPENDENT_AMBULATORY_CARE_PROVIDER_SITE_OTHER): Payer: 59

## 2013-09-13 VITALS — BP 149/77 | HR 90 | Resp 16

## 2013-09-13 DIAGNOSIS — M722 Plantar fascial fibromatosis: Secondary | ICD-10-CM

## 2013-09-13 DIAGNOSIS — M79609 Pain in unspecified limb: Secondary | ICD-10-CM

## 2013-09-13 NOTE — Progress Notes (Signed)
   Subjective:    Patient ID: Brandy Love, female    DOB: 07-Jun-1956, 58 y.o.   MRN: [id number]  HPI Comments: "My feet have been hurting like crazy"  Patient presents to pick up orthotics. Instructions were reviewed.      Review of Systems no new changes or findings     Objective:   Physical Exam Neurovascular status is intact with pedal pulses palpable epicritic and proprioceptive sensations intact and symmetric bilateral. He's had pain inferior calcaneal medial arch area left heel orthotics are dispensed with break in wearing instructions a fit and contour well this time. Patient will be recheck in one to 2 months for adjustments if needed is again oral instructions on for 2 the orthoses at this time.       Assessment & Plan:  Followup for plantar fasciitis/heel spur syndrome orthotics are dispensed at this time with use and wear instructions recheck in 2 months for an as-needed basis for followup adjustments if needed also continue with [facility] for the next couple days during the adjustment period  Harriet Masson DPM

## 2013-09-13 NOTE — Patient Instructions (Signed)

## 2013-10-18 ENCOUNTER — Ambulatory Visit: Payer: 59

## 2013-10-21 ENCOUNTER — Ambulatory Visit (INDEPENDENT_AMBULATORY_CARE_PROVIDER_SITE_OTHER): Payer: 59

## 2013-10-21 VITALS — BP 171/85 | HR 93 | Resp 18

## 2013-10-21 DIAGNOSIS — M722 Plantar fascial fibromatosis: Secondary | ICD-10-CM

## 2013-10-21 DIAGNOSIS — M79609 Pain in unspecified limb: Secondary | ICD-10-CM

## 2013-10-21 MED ORDER — TRIAMCINOLONE ACETONIDE 10 MG/ML IJ SUSP: 10.0000 mg | Freq: Once | INTRAMUSCULAR | Status: DC

## 2013-10-21 NOTE — Patient Instructions (Signed)

## 2013-10-21 NOTE — Progress Notes (Signed)
   Subjective:    Patient ID: Brandy Love, female    DOB: 1956/06/06, 58 y.o.   MRN: [id number]  HPI my feet hurt and when I got the inserts they only helped for two days and still hurts in the heel and feels like a nail has been jabbing my heel    Review of Systems no systemic changes or findings     Objective:   Physical Exam Neurovascular status is intact and unchanged pedal pulses are palpable epicritic and proprioceptive sensations intact patient has painful scars and posterior left heel from previous trauma are is having exquisite pain tenderness medial calcaneal tubercle area Magan plantar fascia calcaneal insertion the fascia on the left. The orthotics fit and contour well to help for a while palpation stop taking meloxicam and the has had a considerable a flareup a couple months and she's had a steroid injection. Has had' 3 injections but over a 6 months or longer.       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

## 2013-11-05 ENCOUNTER — Ambulatory Visit: Payer: 59

## 2013-11-12 ENCOUNTER — Ambulatory Visit (INDEPENDENT_AMBULATORY_CARE_PROVIDER_SITE_OTHER): Payer: 59

## 2013-11-12 VITALS — BP 140/64 | HR 79 | Resp 16

## 2013-11-12 DIAGNOSIS — M722 Plantar fascial fibromatosis: Secondary | ICD-10-CM

## 2013-11-12 DIAGNOSIS — M79609 Pain in unspecified limb: Secondary | ICD-10-CM

## 2013-11-12 NOTE — Patient Instructions (Signed)

## 2013-11-12 NOTE — Progress Notes (Signed)
   Subjective:    Patient ID: Brandy Love, female    DOB: [birth year], 58 y.o.   MRN: [id number]  HPI Comments: "Still sore, but better than last time"  Plantar Fasciitis - Follow up left heel      Review of Systems no  systemic findings or changes noted     Objective:   Physical Exam Okay objective findings intact and unchanged pedal pulses are palpable epicritic sensations intact proprioceptive sensations intact patient is left foot is doing much better she says steroid injection to the orthotics fit and contour well Humphrey protecting her left foot she may have strained her right foot mid arch the right foot has some pain along the plantar fascial band although not excruciating is aggravated with any prolonged standing activities and is advised to maintain the orthoses which fit and contour well also using meloxicam when necessary pain. Also at this time suggested alternate warm compress ice pack elevation the case ice does aggravate her foot may even have some description of or gauze according to the patient hurt toes and hands and feet symptoms change color with cold exposure this may explain her arthropathy type condition she may have or gauze phenomenon associated with arthropathy as such will avoid the ice packs as the images were to stay with warm compresses also advised that if she does not have improvement in the next month or 2 she may be candidate for physical therapy or other alternative invasive and noninvasive options.       Assessment & Plan:  Assessment plantar fasciitis/heel spur syndrome proven left knee symptoms temporarily. On the right we'll continue with the anti-inflammatory stretching and massage and warm compresses realize reevaluate with the next month or 2 if there is continued pain or symptomology consider other options otherwise maintain orthoses as instructed considering a steroid injection to the right foot if no improvement next  Harriet Masson DPM

## 2013-11-19 ENCOUNTER — Ambulatory Visit: Payer: 59

## 2014-02-19 ENCOUNTER — Ambulatory Visit: Payer: 59

## 2014-02-21 ENCOUNTER — Ambulatory Visit: Payer: 59

## 2014-03-21 ENCOUNTER — Ambulatory Visit (INDEPENDENT_AMBULATORY_CARE_PROVIDER_SITE_OTHER): Payer: 59

## 2014-03-21 VITALS — BP 154/84 | HR 90 | Resp 16

## 2014-03-21 DIAGNOSIS — M79672 Pain in left foot: Secondary | ICD-10-CM

## 2014-03-21 DIAGNOSIS — M722 Plantar fascial fibromatosis: Secondary | ICD-10-CM

## 2014-03-21 DIAGNOSIS — M7730 Calcaneal spur, unspecified foot: Secondary | ICD-10-CM

## 2014-03-21 DIAGNOSIS — M79609 Pain in unspecified limb: Secondary | ICD-10-CM

## 2014-03-21 DIAGNOSIS — M7732 Calcaneal spur, left foot: Secondary | ICD-10-CM

## 2014-03-21 NOTE — Patient Instructions (Signed)

## 2014-03-21 NOTE — Progress Notes (Signed)
   Subjective:    Patient ID: Brandy Love, female    DOB: 1955-12-29, 58 y.o.   MRN: [id number]  HPI Comments: "They really aren't any better"  Plantar Fasciitis  - Follow up left heel     Review of Systems no new findings or systemic changes noted     Objective:   Physical Exam Lower extremity objective findings unchanged patient's at least 3 steroid injections in the last 9 months or so has been in the orthotics released for 5 months some improvement although still painful from mid arch and medial calcaneal tubercle patient is now has had 3 episodes of plantar fasciitis the last several years his most recent toes no lasting nearly a year. Left foot is painful on palpation weightbearing but she first step the orthotics fit and contour well however continues to have fascial symptomology and strain has maintain NSAID therapy has had steroid injections orthotics in wearing a good stable shoe at this time. As mentioned previously if no improvement options for physical therapy and possible surgical intervention were discussed.       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 Patient will be recheck within 1 month for reevaluation if no significant improvement discussed the options of surgery such as EPF recheck in one month as scheduled  Harriet Masson DPM

## 2014-03-31 ENCOUNTER — Telehealth: Payer: Self-pay | Admitting: *Deleted

## 2014-03-31 DIAGNOSIS — M722 Plantar fascial fibromatosis: Secondary | ICD-10-CM

## 2014-03-31 NOTE — Telephone Encounter (Signed)
Dr. Blenda Mounts was setting me up for physical therapy but I have not heard when I'm supposed to start.  Will you give me a call back?  Thank you.

## 2014-04-02 NOTE — Telephone Encounter (Signed)
I called and informed her that I sent an order to [facility].  I gave her their phone and address.  She's going to call and get it set up.

## 2014-05-07 ENCOUNTER — Other Ambulatory Visit: Payer: Self-pay | Admitting: Family Medicine

## 2014-05-07 ENCOUNTER — Ambulatory Visit
Admission: RE | Admit: 2014-05-07 | Discharge: 2014-05-07 | Disposition: A | Discharge status: Home / Self Care | Payer: 59 | Source: Ambulatory Visit | Attending: Family Medicine | Admitting: Family Medicine

## 2014-05-07 DIAGNOSIS — R079 Chest pain, unspecified: Secondary | ICD-10-CM

## 2014-05-13 ENCOUNTER — Other Ambulatory Visit: Payer: Self-pay

## 2014-05-13 DIAGNOSIS — Z1231 Encounter for screening mammogram for malignant neoplasm of breast: Secondary | ICD-10-CM

## 2014-05-21 ENCOUNTER — Ambulatory Visit: Payer: 59

## 2014-05-29 ENCOUNTER — Ambulatory Visit
Admission: RE | Admit: 2014-05-29 | Discharge: 2014-05-29 | Disposition: A | Discharge status: Home / Self Care | Payer: 59 | Source: Ambulatory Visit

## 2014-05-29 ENCOUNTER — Encounter (INDEPENDENT_AMBULATORY_CARE_PROVIDER_SITE_OTHER): Payer: Self-pay

## 2014-05-29 ENCOUNTER — Other Ambulatory Visit: Payer: Self-pay | Admitting: Family Medicine

## 2014-05-29 DIAGNOSIS — R928 Other abnormal and inconclusive findings on diagnostic imaging of breast: Secondary | ICD-10-CM

## 2014-05-29 DIAGNOSIS — Z1231 Encounter for screening mammogram for malignant neoplasm of breast: Secondary | ICD-10-CM

## 2014-06-09 ENCOUNTER — Ambulatory Visit
Admission: RE | Admit: 2014-06-09 | Discharge: 2014-06-09 | Disposition: A | Discharge status: Home / Self Care | Payer: 59 | Source: Ambulatory Visit | Attending: Family Medicine | Admitting: Family Medicine

## 2014-06-09 DIAGNOSIS — R928 Other abnormal and inconclusive findings on diagnostic imaging of breast: Secondary | ICD-10-CM

## 2014-07-11 HISTORY — PX: CATARACT EXTRACTION: SUR2

## 2014-10-08 ENCOUNTER — Emergency Department
Admit: 2014-10-08 | Disposition: A | Discharge status: Home / Self Care | Payer: Self-pay | Admitting: Emergency Medicine

## 2015-01-02 ENCOUNTER — Other Ambulatory Visit: Payer: Self-pay | Admitting: Family Medicine

## 2015-01-02 DIAGNOSIS — R609 Edema, unspecified: Secondary | ICD-10-CM

## 2015-01-02 DIAGNOSIS — M542 Cervicalgia: Secondary | ICD-10-CM

## 2015-01-07 ENCOUNTER — Ambulatory Visit
Admission: RE | Admit: 2015-01-07 | Discharge: 2015-01-07 | Disposition: A | Discharge status: Home / Self Care | Payer: 59 | Source: Ambulatory Visit | Attending: Family Medicine | Admitting: Family Medicine

## 2015-01-07 DIAGNOSIS — M542 Cervicalgia: Secondary | ICD-10-CM

## 2015-01-07 DIAGNOSIS — R609 Edema, unspecified: Secondary | ICD-10-CM

## 2015-01-07 MED ORDER — IOPAMIDOL (ISOVUE-300) INJECTION 61%
75.0000 mL | Freq: Once | INTRAVENOUS | Status: AC | PRN
  Administered 2015-01-07: 75 mL via INTRAVENOUS

## 2016-04-06 ENCOUNTER — Other Ambulatory Visit: Payer: Self-pay | Admitting: Family Medicine

## 2016-04-06 DIAGNOSIS — Z1231 Encounter for screening mammogram for malignant neoplasm of breast: Secondary | ICD-10-CM

## 2016-05-31 ENCOUNTER — Ambulatory Visit: Payer: Self-pay

## 2016-06-10 ENCOUNTER — Ambulatory Visit
Admission: RE | Admit: 2016-06-10 | Discharge: 2016-06-10 | Disposition: A | Discharge status: Home / Self Care | Payer: 59 | Source: Ambulatory Visit | Attending: Family Medicine | Admitting: Family Medicine

## 2016-06-10 DIAGNOSIS — Z1231 Encounter for screening mammogram for malignant neoplasm of breast: Secondary | ICD-10-CM

## 2016-08-11 DIAGNOSIS — Z961 Presence of intraocular lens: Secondary | ICD-10-CM | POA: Diagnosis not present

## 2016-08-11 DIAGNOSIS — H401131 Primary open-angle glaucoma, bilateral, mild stage: Secondary | ICD-10-CM | POA: Diagnosis not present

## 2016-08-11 DIAGNOSIS — H04123 Dry eye syndrome of bilateral lacrimal glands: Secondary | ICD-10-CM | POA: Diagnosis not present

## 2016-08-29 DIAGNOSIS — J019 Acute sinusitis, unspecified: Secondary | ICD-10-CM | POA: Diagnosis not present

## 2016-10-06 DIAGNOSIS — I10 Essential (primary) hypertension: Secondary | ICD-10-CM | POA: Diagnosis not present

## 2016-10-06 DIAGNOSIS — N3281 Overactive bladder: Secondary | ICD-10-CM | POA: Diagnosis not present

## 2016-10-06 DIAGNOSIS — K219 Gastro-esophageal reflux disease without esophagitis: Secondary | ICD-10-CM | POA: Diagnosis not present

## 2016-10-11 DIAGNOSIS — H401131 Primary open-angle glaucoma, bilateral, mild stage: Secondary | ICD-10-CM | POA: Diagnosis not present

## 2016-10-11 DIAGNOSIS — H04123 Dry eye syndrome of bilateral lacrimal glands: Secondary | ICD-10-CM | POA: Diagnosis not present

## 2016-10-26 DIAGNOSIS — J4541 Moderate persistent asthma with (acute) exacerbation: Secondary | ICD-10-CM | POA: Diagnosis not present

## 2017-02-14 DIAGNOSIS — H401131 Primary open-angle glaucoma, bilateral, mild stage: Secondary | ICD-10-CM | POA: Diagnosis not present

## 2017-02-14 DIAGNOSIS — H04123 Dry eye syndrome of bilateral lacrimal glands: Secondary | ICD-10-CM | POA: Diagnosis not present

## 2017-03-07 DIAGNOSIS — M25531 Pain in right wrist: Secondary | ICD-10-CM | POA: Diagnosis not present

## 2017-03-07 DIAGNOSIS — M25561 Pain in right knee: Secondary | ICD-10-CM | POA: Diagnosis not present

## 2017-03-20 DIAGNOSIS — S30861A Insect bite (nonvenomous) of abdominal wall, initial encounter: Secondary | ICD-10-CM | POA: Diagnosis not present

## 2017-03-28 DIAGNOSIS — M1711 Unilateral primary osteoarthritis, right knee: Secondary | ICD-10-CM | POA: Diagnosis not present

## 2017-04-04 DIAGNOSIS — M1711 Unilateral primary osteoarthritis, right knee: Secondary | ICD-10-CM | POA: Diagnosis not present

## 2017-04-10 DIAGNOSIS — R1011 Right upper quadrant pain: Secondary | ICD-10-CM | POA: Diagnosis not present

## 2017-04-10 DIAGNOSIS — Z23 Encounter for immunization: Secondary | ICD-10-CM | POA: Diagnosis not present

## 2017-04-10 DIAGNOSIS — E782 Mixed hyperlipidemia: Secondary | ICD-10-CM | POA: Diagnosis not present

## 2017-04-10 DIAGNOSIS — I10 Essential (primary) hypertension: Secondary | ICD-10-CM | POA: Diagnosis not present

## 2017-04-11 DIAGNOSIS — M1711 Unilateral primary osteoarthritis, right knee: Secondary | ICD-10-CM | POA: Diagnosis not present

## 2017-04-12 ENCOUNTER — Other Ambulatory Visit: Payer: Self-pay | Admitting: Family Medicine

## 2017-04-12 DIAGNOSIS — R1011 Right upper quadrant pain: Secondary | ICD-10-CM

## 2017-04-14 DIAGNOSIS — R739 Hyperglycemia, unspecified: Secondary | ICD-10-CM | POA: Diagnosis not present

## 2017-04-18 DIAGNOSIS — M1711 Unilateral primary osteoarthritis, right knee: Secondary | ICD-10-CM | POA: Diagnosis not present

## 2017-04-21 ENCOUNTER — Ambulatory Visit
Admission: RE | Admit: 2017-04-21 | Discharge: 2017-04-21 | Disposition: A | Discharge status: Home / Self Care | Payer: 59 | Source: Ambulatory Visit | Attending: Family Medicine | Admitting: Family Medicine

## 2017-04-21 DIAGNOSIS — R1011 Right upper quadrant pain: Secondary | ICD-10-CM | POA: Diagnosis not present

## 2017-04-25 DIAGNOSIS — M1712 Unilateral primary osteoarthritis, left knee: Secondary | ICD-10-CM | POA: Diagnosis not present

## 2017-05-02 DIAGNOSIS — M1712 Unilateral primary osteoarthritis, left knee: Secondary | ICD-10-CM | POA: Diagnosis not present

## 2017-06-16 DIAGNOSIS — R310 Gross hematuria: Secondary | ICD-10-CM | POA: Diagnosis not present

## 2017-06-16 DIAGNOSIS — N3020 Other chronic cystitis without hematuria: Secondary | ICD-10-CM | POA: Diagnosis not present

## 2017-06-21 ENCOUNTER — Other Ambulatory Visit (HOSPITAL_COMMUNITY): Payer: Self-pay | Admitting: Gastroenterology

## 2017-06-21 DIAGNOSIS — R1011 Right upper quadrant pain: Secondary | ICD-10-CM

## 2017-06-21 DIAGNOSIS — R1010 Upper abdominal pain, unspecified: Secondary | ICD-10-CM | POA: Diagnosis not present

## 2017-06-23 DIAGNOSIS — H01112 Allergic dermatitis of right lower eyelid: Secondary | ICD-10-CM | POA: Diagnosis not present

## 2017-06-23 DIAGNOSIS — H01114 Allergic dermatitis of left upper eyelid: Secondary | ICD-10-CM | POA: Diagnosis not present

## 2017-06-23 DIAGNOSIS — H01111 Allergic dermatitis of right upper eyelid: Secondary | ICD-10-CM | POA: Diagnosis not present

## 2017-06-29 ENCOUNTER — Encounter (HOSPITAL_COMMUNITY): Payer: 59

## 2017-06-29 DIAGNOSIS — K3189 Other diseases of stomach and duodenum: Secondary | ICD-10-CM | POA: Diagnosis not present

## 2017-06-29 DIAGNOSIS — K2930 Chronic superficial gastritis without bleeding: Secondary | ICD-10-CM | POA: Diagnosis not present

## 2017-06-29 DIAGNOSIS — R1010 Upper abdominal pain, unspecified: Secondary | ICD-10-CM | POA: Diagnosis not present

## 2017-07-06 ENCOUNTER — Encounter (HOSPITAL_COMMUNITY)
Admission: RE | Admit: 2017-07-06 | Discharge: 2017-07-06 | Disposition: A | Discharge status: Home / Self Care | Payer: 59 | Source: Ambulatory Visit | Attending: Gastroenterology | Admitting: Gastroenterology

## 2017-07-06 DIAGNOSIS — R1011 Right upper quadrant pain: Secondary | ICD-10-CM | POA: Diagnosis present

## 2017-07-06 MED ORDER — TECHNETIUM TC 99M MEBROFENIN IV KIT
5.0000 | PACK | Freq: Once | INTRAVENOUS | Status: AC | PRN
  Administered 2017-07-06: 5 via INTRAVENOUS

## 2017-08-10 DIAGNOSIS — H01112 Allergic dermatitis of right lower eyelid: Secondary | ICD-10-CM | POA: Diagnosis not present

## 2017-08-10 DIAGNOSIS — H01114 Allergic dermatitis of left upper eyelid: Secondary | ICD-10-CM | POA: Diagnosis not present

## 2017-08-10 DIAGNOSIS — H401131 Primary open-angle glaucoma, bilateral, mild stage: Secondary | ICD-10-CM | POA: Diagnosis not present

## 2017-08-10 DIAGNOSIS — H01111 Allergic dermatitis of right upper eyelid: Secondary | ICD-10-CM | POA: Diagnosis not present

## 2017-08-10 DIAGNOSIS — H04123 Dry eye syndrome of bilateral lacrimal glands: Secondary | ICD-10-CM | POA: Diagnosis not present

## 2017-08-15 DIAGNOSIS — L820 Inflamed seborrheic keratosis: Secondary | ICD-10-CM | POA: Diagnosis not present

## 2017-08-15 DIAGNOSIS — D225 Melanocytic nevi of trunk: Secondary | ICD-10-CM | POA: Diagnosis not present

## 2017-08-15 DIAGNOSIS — L258 Unspecified contact dermatitis due to other agents: Secondary | ICD-10-CM | POA: Diagnosis not present

## 2017-08-15 DIAGNOSIS — L308 Other specified dermatitis: Secondary | ICD-10-CM | POA: Diagnosis not present

## 2017-09-08 DIAGNOSIS — H01114 Allergic dermatitis of left upper eyelid: Secondary | ICD-10-CM | POA: Diagnosis not present

## 2017-09-08 DIAGNOSIS — H01111 Allergic dermatitis of right upper eyelid: Secondary | ICD-10-CM | POA: Diagnosis not present

## 2017-09-08 DIAGNOSIS — H01112 Allergic dermatitis of right lower eyelid: Secondary | ICD-10-CM | POA: Diagnosis not present

## 2017-09-19 DIAGNOSIS — Z961 Presence of intraocular lens: Secondary | ICD-10-CM | POA: Diagnosis not present

## 2017-09-19 DIAGNOSIS — H5022 Vertical strabismus, left eye: Secondary | ICD-10-CM | POA: Diagnosis not present

## 2017-09-19 DIAGNOSIS — H532 Diplopia: Secondary | ICD-10-CM | POA: Diagnosis not present

## 2017-09-26 ENCOUNTER — Encounter (INDEPENDENT_AMBULATORY_CARE_PROVIDER_SITE_OTHER): Payer: Self-pay

## 2017-10-09 DIAGNOSIS — K2950 Unspecified chronic gastritis without bleeding: Secondary | ICD-10-CM | POA: Diagnosis not present

## 2017-10-09 DIAGNOSIS — R1012 Left upper quadrant pain: Secondary | ICD-10-CM | POA: Diagnosis not present

## 2017-10-09 DIAGNOSIS — R1011 Right upper quadrant pain: Secondary | ICD-10-CM | POA: Diagnosis not present

## 2017-10-09 DIAGNOSIS — K257 Chronic gastric ulcer without hemorrhage or perforation: Secondary | ICD-10-CM | POA: Diagnosis not present

## 2017-10-09 DIAGNOSIS — K219 Gastro-esophageal reflux disease without esophagitis: Secondary | ICD-10-CM | POA: Diagnosis not present

## 2017-10-12 DIAGNOSIS — R7303 Prediabetes: Secondary | ICD-10-CM | POA: Diagnosis not present

## 2017-10-12 DIAGNOSIS — I10 Essential (primary) hypertension: Secondary | ICD-10-CM | POA: Diagnosis not present

## 2017-10-12 DIAGNOSIS — E782 Mixed hyperlipidemia: Secondary | ICD-10-CM | POA: Diagnosis not present

## 2017-10-17 ENCOUNTER — Ambulatory Visit (INDEPENDENT_AMBULATORY_CARE_PROVIDER_SITE_OTHER): Payer: 59 | Admitting: Family Medicine

## 2017-10-17 ENCOUNTER — Encounter (INDEPENDENT_AMBULATORY_CARE_PROVIDER_SITE_OTHER): Payer: Self-pay | Admitting: Family Medicine

## 2017-10-17 VITALS — BP 154/89 | HR 77 | Temp 97.9°F | Ht 67.0 in | Wt 273.0 lb

## 2017-10-17 DIAGNOSIS — R7303 Prediabetes: Secondary | ICD-10-CM

## 2017-10-17 DIAGNOSIS — R5383 Other fatigue: Secondary | ICD-10-CM | POA: Diagnosis not present

## 2017-10-17 DIAGNOSIS — Z0289 Encounter for other administrative examinations: Secondary | ICD-10-CM

## 2017-10-17 DIAGNOSIS — Z6841 Body Mass Index (BMI) 40.0 and over, adult: Secondary | ICD-10-CM

## 2017-10-17 DIAGNOSIS — R0602 Shortness of breath: Secondary | ICD-10-CM

## 2017-10-17 DIAGNOSIS — Z1331 Encounter for screening for depression: Secondary | ICD-10-CM

## 2017-10-17 DIAGNOSIS — Z9189 Other specified personal risk factors, not elsewhere classified: Secondary | ICD-10-CM

## 2017-10-17 DIAGNOSIS — I10 Essential (primary) hypertension: Secondary | ICD-10-CM

## 2017-10-17 NOTE — Progress Notes (Signed)
.  Office: [phone]  /  Fax: [phone]   HPI:   Chief Complaint: Brandy Love (MR# [id number]) is a 62 y.o. female who presents on 10/17/2017 for obesity evaluation and treatment. Current BMI is Body mass index is 42.76 kg/m.Brandy Love Brandy Love has struggled with obesity for years and has been unsuccessful in either losing weight or maintaining long term weight loss. [name] attended our information session and states she is currently in the action stage of change and ready to dedicate time achieving and maintaining a healthier weight.   Cardelia's husband is doing [facility] at [facility], drinking 2 shakes per day to lose weight to have hip replacement surgery.  Brandy Love states her family eats meals together she thinks her family will eat healthier with  her her desired weight loss is 113-118 lbs she started gaining weight during pregnancy her heaviest weight ever was 277 lbs she has significant food cravings issues  she snacks frequently in the evenings she wakes up frquently in the middle of the night to eat she skips meals frequently she is frequently drinking liquids with calories she has problems with excessive hunger  she frequently eats larger portions than normal  she struggles with emotional eating    Brandy Love feels her energy is lower than it should be. This has worsened with weight gain and has not worsened recently. Brandy Love admits to daytime somnolence and  admits to waking up still tired. Patient is at risk for obstructive sleep apnea. Patent has a history of symptoms of daytime Brandy and morning headache. Patient generally gets 6 hours of sleep per night, and states they generally have nightime awakenings. Snoring is present. Apneic episodes are present. Epworth Sleepiness Score is 16.  Dyspnea on exertion Brandy Love notes increasing shortness of breath with exercising and seems to be worsening over time with weight gain. She notes getting out of breath sooner with activity than she  used to. This has not gotten worse recently. Brandy Love denies orthopnea.  Hypertension Brandy Love is a 62 y.o. female with hypertension. Brandy Love's blood pressure is elevated today, she is on losartan and she states her blood pressure has been uncontrolled for a while now. She denies chest pain or headache. She is working weight loss to help control her blood pressure with the goal of decreasing her risk of heart attack and stroke. Brandy Love's blood pressure is not currently controlled.  Pre-Diabetes Brandy Love has a diagnosis of pre-diabetes based on her elevated Hgb A1c and was informed this puts her at greater risk of developing diabetes. Brandy Love states fasting BGs range between 99 and 132 over last 5 years. No recent A1c in Epic. She is not taking metformin currently and continues to work on diet and exercise to decrease risk of diabetes. She notes polyphagia and denies nausea or hypoglycemia.  At risk for diabetes Brandy Love is at higher than average risk for developing diabetes due to her obesity and pre-diabetes. She currently denies polyuria or polydipsia.  Depression Screen Brandy Love's Food and Mood (modified PHQ-9) score was  Depression screen PHQ 2/9 10/17/2017  Decreased Interest 3  Down, Depressed, Hopeless 3  PHQ - 2 Score 6  Altered sleeping 2  Tired, decreased energy 3  Change in appetite 3  Feeling bad or failure about yourself  2  Trouble concentrating 2  Moving slowly or fidgety/restless 1  Suicidal thoughts 2  PHQ-9 Score 21    ALLERGIES: Allergies  Allergen Reactions  . Adhesive [Tape]  Rash with tape from first stage interstim  . Ibuprofen     REACTION: vertigo  . Amlodipine Hives, Swelling and Rash  . Betadine [Povidone Iodine] Rash     ALL TOPICAL IODINES    MEDICATIONS: Current Outpatient Medications on File Prior to Visit  Medication Sig Dispense Refill  . albuterol (PROVENTIL HFA;VENTOLIN HFA) 108 (90 Base) MCG/ACT inhaler Inhale into the lungs every 6 (six) hours as needed  for wheezing or shortness of breath.    . beclomethasone (QVAR) 80 MCG/ACT inhaler Inhale 2 puffs into the lungs 2 (two) times daily.    Brandy Love Clocortolone Pivalate (CLODERM) 0.1 % cream Apply 1 application topically 2 (two) times daily.    Brandy Love dexlansoprazole (DEXILANT) 60 MG capsule Take 60 mg by mouth daily.    Brandy Love losartan (COZAAR) 100 MG tablet Take 100 mg by mouth daily.    . Multiple Vitamins-Minerals (MULTIVITAMIN WITH MINERALS) tablet Take 1 tablet by mouth daily.    . sertraline (ZOLOFT) 100 MG tablet Take 100 mg by mouth 2 (two) times daily.    . sucralfate (CARAFATE) 1 g tablet Take 1 g by mouth 4 (four) times daily -  with meals and at bedtime.    . tolterodine (DETROL LA) 4 MG 24 hr capsule Take 4 mg by mouth daily.     Current Facility-Administered Medications on File Prior to Visit  Medication Dose Route Frequency Provider Last Rate Last Dose  . triamcinolone acetonide (KENALOG) 10 MG/ML injection 10 mg  10 mg Other Once Harriet Masson, DPM      . triamcinolone acetonide (KENALOG) 10 MG/ML injection 10 mg  10 mg Other Once Harriet Masson, DPM      . triamcinolone acetonide (KENALOG) 10 MG/ML injection 10 mg  10 mg Other Once Harriet Masson, DPM        PAST MEDICAL HISTORY: Past Medical History:  Diagnosis Date  . Asthma    controlled well  . Bladder prolapse, female, acquired   . Edema extremities    lower legs at times  . Gastric ulcer   . GERD (gastroesophageal reflux disease)   . Glaucoma   . High blood pressure   . OAB (overactive bladder)   . Prediabetes     PAST SURGICAL HISTORY: Past Surgical History:  Procedure Laterality Date  . ABDOMINAL HYSTERECTOMY  1993  . APPENDECTOMY    . CATARACT EXTRACTION  2016  . CYSTECTOMY     1994 and 1982  . INTERSTIM IMPLANT PLACEMENT  04/10/2012   Procedure: Barrie Love IMPLANT FIRST STAGE;  Surgeon: [name], MD;  Location: [facility];  Service: Urology;  Laterality: N/A;  rad tech ok per vicki at  main   . INTERSTIM IMPLANT PLACEMENT  04/10/2012   Procedure: Barrie Love IMPLANT SECOND STAGE;  Surgeon: [name], MD;  Location: [facility];  Service: Urology;  Laterality: N/A;  . LOWER LEG SOFT TISSUE TUMOR EXCISION  1994   cyst left ankle -involved muscle removed-limited mobility now  . NASAL SEPTUM SURGERY      SOCIAL HISTORY: Social History   Tobacco Use  . Smoking status: Never Smoker  . Smokeless tobacco: Never Used  Substance Use Topics  . Alcohol use: No  . Drug use: No    FAMILY HISTORY: Family History  Problem Relation Age of Onset  . Asthma Mother   . Diabetes Mother   . Hernia Mother   . Obesity Mother   . Heart disease Father   .  Prostate cancer Father     ROS: Review of Systems  Constitutional: Positive for malaise/Brandy. Negative for weight loss.  HENT: Positive for sinus pain.        + Hoarseness  Eyes: Positive for blurred vision and double vision.       + Vision Changes + Wear glasses or contacts + Floaters  Respiratory: Positive for shortness of breath (with exertion) and wheezing.   Cardiovascular: Negative for chest pain and orthopnea.       + Very cold feet or hands  Gastrointestinal: Positive for heartburn.  Genitourinary: Positive for frequency.  Musculoskeletal:       + Muscle or joint pain  Skin: Positive for itching and rash.       + Dryness  Neurological: Negative for headaches.  Endo/Heme/Allergies: Negative for polydipsia.       Positive polyphagia Negative hypoglycemia  Psychiatric/Behavioral: Positive for depression. Negative for suicidal ideas.    PHYSICAL EXAM: Blood pressure (!) 154/89, pulse 77, temperature 97.9 F (36.6 C), temperature source Oral, height '5\' 7"'$  (1.702 m), weight 273 lb (123.8 kg), SpO2 95 %. Body mass index is 42.76 kg/m. Physical Exam  Constitutional: She is oriented to person, place, and time. She appears well-developed and well-nourished.  HENT:  Head: Normocephalic and  atraumatic.  Nose: Nose normal.  Eyes: EOM are normal. No scleral icterus.  Neck: Normal range of motion. Neck supple. No thyromegaly present.  Cardiovascular: Normal rate and regular rhythm.  Pulmonary/Chest: Effort normal. No respiratory distress.  Abdominal: Soft. There is no tenderness.  + Obesity  Musculoskeletal:  Range of Motion normal in all 4 extremities Trace edema noted in bilateral lower extremities  Neurological: She is alert and oriented to person, place, and time. Coordination normal.  Skin: Skin is warm and dry.  Psychiatric: She has a normal mood and affect. Her behavior is normal.  Vitals reviewed.   RECENT LABS AND TESTS: BMET    Component Value Date/Time   NA 143 04/10/2012 0731   K 3.8 04/10/2012 0731   CL 108 04/10/2012 0731   CO2 26 09/07/2010 0356   GLUCOSE 114 (H) 04/10/2012 0731   BUN 12 04/10/2012 0731   CREATININE 0.90 04/10/2012 0731   CALCIUM 8.9 09/07/2010 0356   GFRNONAA >60 09/07/2010 0356   GFRAA  09/07/2010 0356    >60        The eGFR has been calculated using the MDRD equation. This calculation has not been validated in all clinical situations. eGFR's persistently <60 mL/min signify possible Chronic Kidney Disease.   No results found for: HGBA1C No results found for: INSULIN CBC    Component Value Date/Time   WBC 6.8 09/07/2010 0356   RBC 4.90 09/07/2010 0356   HGB 12.6 04/10/2012 0731   HCT 37.0 04/10/2012 0731   PLT 235 09/07/2010 0356   MCV 79.8 09/07/2010 0356   MCH 26.3 09/07/2010 0356   MCHC 33.0 09/07/2010 0356   RDW 14.7 09/07/2010 0356   LYMPHSABS 1.0 09/07/2010 0356   MONOABS 0.5 09/07/2010 0356   EOSABS 0.0 09/07/2010 0356   BASOSABS 0.0 09/07/2010 0356   Iron/TIBC/Ferritin/ %Sat No results found for: IRON, TIBC, FERRITIN, IRONPCTSAT Lipid Panel  No results found for: CHOL, TRIG, HDL, CHOLHDL, VLDL, LDLCALC, LDLDIRECT Hepatic Function Panel     Component Value Date/Time   PROT 6.8 09/07/2010 0356    ALBUMIN 3.6 09/07/2010 0356   AST 22 09/07/2010 0356   ALT 23 09/07/2010 0356   ALKPHOS 94 09/07/2010  0356   BILITOT 0.7 09/07/2010 0356   No results found for: TSH Vitamin D No recent labs  ECG  shows NSR with a rate of 74 BPM INDIRECT CALORIMETER done today shows a VO2 of 343 and a REE of 2389. Her calculated basal metabolic rate is 9381 thus her basal metabolic rate is better than expected.    ASSESSMENT AND PLAN: Other Brandy - Plan: EKG 12-Lead, Vitamin B12, CBC With Differential, Folate, Lipid Panel With LDL/HDL Ratio, T3, T4, free, TSH, VITAMIN D 25 Hydroxy (Vit-D Deficiency, Fractures)  Shortness of breath on exertion - Plan: CBC With Differential  Essential hypertension  Prediabetes - Plan: Comprehensive metabolic panel, Hemoglobin A1c, Insulin, random  Depression screening  At risk for diabetes mellitus  Class 3 severe obesity with serious comorbidity and body mass index (BMI) of 40.0 to 44.9 in adult, unspecified obesity type (HCC)  PLAN:  Brandy Dinita was informed that her Brandy may be related to obesity, depression or many other causes. Labs will be ordered, and in the meanwhile Chalene has agreed to work on diet, exercise and weight loss to help with Brandy. Proper sleep hygiene was discussed including the need for 7-8 hours of quality sleep each night. A sleep study was not ordered based on symptoms and Epworth score.  Dyspnea on exertion Chundra's shortness of breath appears to be obesity related and exercise induced. She has agreed to work on weight loss and gradually increase exercise to treat her exercise induced shortness of breath. If Shawnetta follows our instructions and loses weight without improvement of her shortness of breath, we will plan to refer to pulmonology. We will monitor this condition regularly. Ashly agrees to this plan.  Hypertension We discussed sodium restriction, working on healthy weight loss, and a regular exercise program as the means to  achieve improved blood pressure control. Emaley agreed with this plan and agreed to follow up as directed. We will continue to monitor her blood pressure as well as her progress with the above lifestyle modifications. She will continue taking losartan as prescribed, start diet, and will watch for signs of hypotension as she continues her lifestyle modifications. We will check labs and Wylie agrees to follow up with our clinic in 2 weeks and we will recheck blood pressure at that time.  Pre-Diabetes Annya will start diet, exercise, weight loss, and decreasing simple carbohydrates in her diet to help decrease the risk of diabetes. We dicussed metformin including benefits and risks. She was informed that eating too many simple carbohydrates or too many calories at one sitting increases the likelihood of GI side effects. Desirre declined metformin for now and a prescription was not written today. We will check labs and Charnele agrees to follow up with our clinic in 2 weeks as directed to monitor her progress.  Diabetes risk counselling Eilyn was given extended (15 minutes) diabetes prevention counseling today. She is 62 y.o. female and has risk factors for diabetes including obesity and pre-diabetes. We discussed intensive lifestyle modifications today with an emphasis on weight loss as well as increasing exercise and decreasing simple carbohydrates in her diet.  Depression Screen Zarielle had a strongly positive depression screening. Depression is commonly associated with obesity and often results in emotional eating behaviors. We will monitor this closely and work on CBT to help improve the non-hunger eating patterns. Referral to Psychology may be required if no improvement is seen as she continues in our clinic.  Obesity Zariel is currently in the action stage of  change and her goal is to continue with weight loss efforts She has agreed to follow the Category 3 plan Nataleah has been instructed to work up to a goal of 150  minutes of combined cardio and strengthening exercise per week for weight loss and overall health benefits. We discussed the following Behavioral Modification Strategies today: increasing lean protein intake, decreasing simple carbohydrates  and work on meal planning and easy cooking plans  Lydiana has agreed to follow up with our clinic in 2 weeks. She was informed of the importance of frequent follow up visits to maximize her success with intensive lifestyle modifications for her multiple health conditions. She was informed we would discuss her lab results at her next visit unless there is a critical issue that needs to be addressed sooner. Amelda agreed to keep her next visit at the agreed upon time to discuss these results.    OBESITY BEHAVIORAL INTERVENTION VISIT  Today's visit was # 1 out of 22.  Starting weight: 273 lbs Starting date: 10/17/17 Today's weight : 273 lbs Today's date: 10/17/2017 Total lbs lost to date: 0 (Patients must lose 7 lbs in the first 6 months to continue with counseling)   ASK: We discussed the diagnosis of obesity with Nelda Marseille today and Kent agreed to give [place] permission to discuss obesity behavioral modification therapy today.  ASSESS: Oakley has the diagnosis of obesity and her BMI today is 42.75 Asuzena is in the action stage of change   ADVISE: Airika was educated on the multiple health risks of obesity as well as the benefit of weight loss to improve her health. She was advised of the need for long term treatment and the importance of lifestyle modifications.  AGREE: Multiple dietary modification options and treatment options were discussed and  Eara agreed to the above obesity treatment plan.   I, Trixie Dredge, am acting as transcriptionist for [name] Nip, MD  I have reviewed the above documentation for accuracy and completeness, and I agree with the above. -[name] Nip, MD

## 2017-10-18 LAB — COMPREHENSIVE METABOLIC PANEL
ALBUMIN: 4 g/dL (ref 3.6–4.8)
ALT: 35 IU/L — ABNORMAL HIGH (ref 0–32)
AST: 27 IU/L (ref 0–40)
Albumin/Globulin Ratio: 1.3 (ref 1.2–2.2)
Alkaline Phosphatase: 88 IU/L (ref 39–117)
BUN / CREAT RATIO: 15 (ref 12–28)
BUN: 12 mg/dL (ref 8–27)
Bilirubin Total: 0.3 mg/dL (ref 0.0–1.2)
CO2: 27 mmol/L (ref 20–29)
CREATININE: 0.79 mg/dL (ref 0.57–1.00)
Calcium: 9.2 mg/dL (ref 8.7–10.3)
Chloride: 102 mmol/L (ref 96–106)
GFR calc non Af Amer: 81 mL/min/{1.73_m2} (ref >59)
GFR, EST AFRICAN AMERICAN: 93 mL/min/{1.73_m2} (ref >59)
GLOBULIN, TOTAL: 3.1 g/dL (ref 1.5–4.5)
Glucose: 127 mg/dL — ABNORMAL HIGH (ref 65–99)
Potassium: 4.2 mmol/L (ref 3.5–5.2)
SODIUM: 142 mmol/L (ref 134–144)
TOTAL PROTEIN: 7.1 g/dL (ref 6.0–8.5)

## 2017-10-18 LAB — CBC WITH DIFFERENTIAL
Basophils Absolute: 0 10*3/uL (ref 0.0–0.2)
Basos: 1 %
EOS (ABSOLUTE): 0.3 10*3/uL (ref 0.0–0.4)
EOS: 5 %
HEMATOCRIT: 37.5 % (ref 34.0–46.6)
HEMOGLOBIN: 12.2 g/dL (ref 11.1–15.9)
IMMATURE GRANS (ABS): 0 10*3/uL (ref 0.0–0.1)
IMMATURE GRANULOCYTES: 0 %
LYMPHS ABS: 2.2 10*3/uL (ref 0.7–3.1)
LYMPHS: 37 %
MCH: 26.9 pg (ref 26.6–33.0)
MCHC: 32.5 g/dL (ref 31.5–35.7)
MCV: 83 fL (ref 79–97)
Monocytes Absolute: 0.5 10*3/uL (ref 0.1–0.9)
Monocytes: 9 %
NEUTROS ABS: 2.8 10*3/uL (ref 1.4–7.0)
Neutrophils: 48 %
RBC: 4.53 x10E6/uL (ref 3.77–5.28)
RDW: 15.7 % — ABNORMAL HIGH (ref 12.3–15.4)
WBC: 5.8 10*3/uL (ref 3.4–10.8)

## 2017-10-18 LAB — HEMOGLOBIN A1C
Est. average glucose Bld gHb Est-mCnc: 134 mg/dL
Hgb A1c MFr Bld: 6.3 % — ABNORMAL HIGH (ref 4.8–5.6)

## 2017-10-18 LAB — INSULIN, RANDOM: INSULIN: 25.3 u[IU]/mL — AB (ref 2.6–24.9)

## 2017-10-18 LAB — T3: T3 TOTAL: 117 ng/dL (ref 71–180)

## 2017-10-18 LAB — LIPID PANEL WITH LDL/HDL RATIO
Cholesterol, Total: 186 mg/dL (ref 100–199)
HDL: 40 mg/dL (ref >39)
LDL CALC: 119 mg/dL — AB (ref 0–99)
LDl/HDL Ratio: 3 ratio (ref 0.0–3.2)
Triglycerides: 134 mg/dL (ref 0–149)
VLDL Cholesterol Cal: 27 mg/dL (ref 5–40)

## 2017-10-18 LAB — T4, FREE: FREE T4: 1.06 ng/dL (ref 0.82–1.77)

## 2017-10-18 LAB — TSH: TSH: 2.92 u[IU]/mL (ref 0.450–4.500)

## 2017-10-18 LAB — FOLATE

## 2017-10-18 LAB — VITAMIN D 25 HYDROXY (VIT D DEFICIENCY, FRACTURES): Vit D, 25-Hydroxy: 22.4 ng/mL — ABNORMAL LOW (ref 30.0–100.0)

## 2017-10-18 LAB — VITAMIN B12: Vitamin B-12: 676 pg/mL (ref 232–1245)

## 2017-10-20 DIAGNOSIS — R310 Gross hematuria: Secondary | ICD-10-CM | POA: Diagnosis not present

## 2017-10-20 DIAGNOSIS — N3020 Other chronic cystitis without hematuria: Secondary | ICD-10-CM | POA: Diagnosis not present

## 2017-10-24 DIAGNOSIS — Z961 Presence of intraocular lens: Secondary | ICD-10-CM | POA: Diagnosis not present

## 2017-10-24 DIAGNOSIS — H4912 Fourth [trochlear] nerve palsy, left eye: Secondary | ICD-10-CM | POA: Diagnosis not present

## 2017-10-31 ENCOUNTER — Ambulatory Visit (INDEPENDENT_AMBULATORY_CARE_PROVIDER_SITE_OTHER): Payer: Self-pay | Admitting: Family Medicine

## 2017-11-06 DIAGNOSIS — K2950 Unspecified chronic gastritis without bleeding: Secondary | ICD-10-CM | POA: Diagnosis not present

## 2017-11-06 DIAGNOSIS — K219 Gastro-esophageal reflux disease without esophagitis: Secondary | ICD-10-CM | POA: Diagnosis not present

## 2017-11-06 DIAGNOSIS — Z1211 Encounter for screening for malignant neoplasm of colon: Secondary | ICD-10-CM | POA: Diagnosis not present

## 2017-11-08 ENCOUNTER — Ambulatory Visit (INDEPENDENT_AMBULATORY_CARE_PROVIDER_SITE_OTHER): Payer: 59 | Admitting: Family Medicine

## 2017-11-08 VITALS — BP 145/83 | HR 74 | Temp 97.4°F | Ht 67.0 in | Wt 272.0 lb

## 2017-11-08 DIAGNOSIS — R7303 Prediabetes: Secondary | ICD-10-CM

## 2017-11-08 DIAGNOSIS — I10 Essential (primary) hypertension: Secondary | ICD-10-CM

## 2017-11-08 DIAGNOSIS — Z9189 Other specified personal risk factors, not elsewhere classified: Secondary | ICD-10-CM

## 2017-11-08 DIAGNOSIS — E559 Vitamin D deficiency, unspecified: Secondary | ICD-10-CM

## 2017-11-08 DIAGNOSIS — R7989 Other specified abnormal findings of blood chemistry: Secondary | ICD-10-CM

## 2017-11-08 DIAGNOSIS — R945 Abnormal results of liver function studies: Secondary | ICD-10-CM

## 2017-11-08 DIAGNOSIS — Z6841 Body Mass Index (BMI) 40.0 and over, adult: Secondary | ICD-10-CM | POA: Diagnosis not present

## 2017-11-08 MED ORDER — CHLORTHALIDONE 25 MG PO TABS: 25.0000 mg | ORAL_TABLET | Freq: Every day | ORAL | 0 refills | Status: DC

## 2017-11-08 MED ORDER — VITAMIN D (ERGOCALCIFEROL) 1.25 MG (50000 UNIT) PO CAPS: 50000.0000 [IU] | ORAL_CAPSULE | ORAL | 0 refills | Status: DC

## 2017-11-13 NOTE — Progress Notes (Signed)
Office: [phone]  /  Fax: [phone]   HPI:   Chief Complaint: OBESITY Brandy Love is here to discuss her progress with her obesity treatment plan. She is on the Category 3 plan and is following her eating plan approximately 10 % of the time. She states she is walking for 10 minutes 5 times per week. Brandy Love was unable to follow her plan due to a recent diagnosis of peptic ulcers and was told to eat bland foods so she ate a lot of simple carbohydrates, bread, potatoes, etc.  Her weight is 272 lb (123.4 kg) today and has had a weight loss of 1 pound over a period of 3 weeks since her last visit. She has lost 1 lb since starting treatment with [place].  Elevated LFT Brandy Love has a new diagnosis of elevated ALT. 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  Her BMI is over 40. She notes abdominal pain due to peptic ulcers. She denies jaundice and has never been told of any liver problems in the past. She denies excessive alcohol intake.  Vitamin D Deficiency Brandy Love has a new diagnosis of vitamin D deficiency. She is on multivitamins, not yet at goal. She notes fatigue and denies nausea, vomiting or muscle weakness.  Pre-Diabetes Brandy Love has a diagnosis of pre-diabetes based on her elevated Hgb A1c and was informed this puts her at greater risk of developing diabetes. Elevated A1c of 6.3, she notes polyphagia. She is very close to diabetes mellitus, especially with elevated fasting glucose of 127. She is not taking metformin currently and continues to work on diet and exercise to decrease risk of diabetes. She denies nausea or hypoglycemia.  At risk for diabetes Brandy Love is at higher than average risk for developing diabetes due to her obesity and pre-diabetes. She currently denies polyuria or polydipsia.  Hypertension Brandy Love is a 62 y.o. female with hypertension. Brandy Love is on Cozaar but blood pressure is not controlled, increase stress in her life which will continue for a while. She denies chest pain. She is  working weight loss to help control her blood pressure with the goal of decreasing her risk of heart attack and stroke.  ALLERGIES: Allergies  Allergen Reactions  . Adhesive [Tape]     Rash with tape from first stage interstim  . Ibuprofen     REACTION: vertigo  . Amlodipine Hives, Swelling and Rash  . Betadine [Povidone Iodine] Rash     ALL TOPICAL IODINES    MEDICATIONS: Current Outpatient Medications on File Prior to Visit  Medication Sig Dispense Refill  . albuterol (PROVENTIL HFA;VENTOLIN HFA) 108 (90 Base) MCG/ACT inhaler Inhale into the lungs every 6 (six) hours as needed for wheezing or shortness of breath.    . beclomethasone (QVAR) 80 MCG/ACT inhaler Inhale 2 puffs into the lungs 2 (two) times daily.    [name] Clocortolone Pivalate (CLODERM) 0.1 % cream Apply 1 application topically 2 (two) times daily.    [name] dexlansoprazole (DEXILANT) 60 MG capsule Take 60 mg by mouth daily.    [name] losartan (COZAAR) 100 MG tablet Take 100 mg by mouth daily.    . Multiple Vitamins-Minerals (MULTIVITAMIN WITH MINERALS) tablet Take 1 tablet by mouth daily.    . sertraline (ZOLOFT) 100 MG tablet Take 100 mg by mouth 2 (two) times daily.    . sucralfate (CARAFATE) 1 g tablet Take 1 g by mouth 4 (four) times daily -  with meals and at bedtime.    . tolterodine (DETROL LA) 4  MG 24 hr capsule Take 4 mg by mouth daily.     Current Facility-Administered Medications on File Prior to Visit  Medication Dose Route Frequency Provider Last Rate Last Dose  . triamcinolone acetonide (KENALOG) 10 MG/ML injection 10 mg  10 mg Other Once Harriet Masson, DPM      . triamcinolone acetonide (KENALOG) 10 MG/ML injection 10 mg  10 mg Other Once Harriet Masson, DPM      . triamcinolone acetonide (KENALOG) 10 MG/ML injection 10 mg  10 mg Other Once Harriet Masson, DPM        PAST MEDICAL HISTORY: Past Medical History:  Diagnosis Date  . Asthma    controlled well  . Bladder prolapse, female, acquired   . Edema  extremities    lower legs at times  . Gastric ulcer   . GERD (gastroesophageal reflux disease)   . Glaucoma   . High blood pressure   . OAB (overactive bladder)   . Prediabetes     PAST SURGICAL HISTORY: Past Surgical History:  Procedure Laterality Date  . ABDOMINAL HYSTERECTOMY  1993  . APPENDECTOMY    . CATARACT EXTRACTION  2016  . CYSTECTOMY     1994 and 1982  . INTERSTIM IMPLANT PLACEMENT  04/10/2012   Procedure: Barrie Lyme IMPLANT FIRST STAGE;  Surgeon: [name], MD;  Location: [facility];  Service: Urology;  Laterality: N/A;  rad tech ok per vicki at [facility]   . INTERSTIM IMPLANT PLACEMENT  04/10/2012   Procedure: Barrie Lyme IMPLANT SECOND STAGE;  Surgeon: [name], MD;  Location: [facility];  Service: Urology;  Laterality: N/A;  . LOWER LEG SOFT TISSUE TUMOR EXCISION  1994   cyst left ankle -involved muscle removed-limited mobility now  . NASAL SEPTUM SURGERY      SOCIAL HISTORY: Social History   Tobacco Use  . Smoking status: Never Smoker  . Smokeless tobacco: Never Used  Substance Use Topics  . Alcohol use: No  . Drug use: No    FAMILY HISTORY: Family History  Problem Relation Age of Onset  . Asthma Mother   . Diabetes Mother   . Hernia Mother   . Obesity Mother   . Heart disease Father   . Prostate cancer Father     ROS: Review of Systems  Constitutional: Positive for malaise/fatigue and weight loss.  Eyes:       Negative jaundice  Cardiovascular: Negative for chest pain.  Gastrointestinal: Positive for abdominal pain. Negative for nausea and vomiting.  Genitourinary: Negative for frequency.  Musculoskeletal:       Negative muscle weakness  Endo/Heme/Allergies: Negative for polydipsia.       Positive polyphagia Negative hypoglycemia    PHYSICAL EXAM: Blood pressure (!) 145/83, pulse 74, temperature (!) 97.4 F (36.3 C), temperature source Oral, height 5\' 7"  (1.702 m), weight 272 lb (123.4 kg),  SpO2 97 %. Body mass index is 42.6 kg/m. Physical Exam  Constitutional: She is oriented to person, place, and time. She appears well-developed and well-nourished.  Cardiovascular: Normal rate.  Pulmonary/Chest: Effort normal.  Musculoskeletal: Normal range of motion.  Neurological: She is oriented to person, place, and time.  Skin: Skin is warm and dry.  Psychiatric: She has a normal mood and affect. Her behavior is normal.  Vitals reviewed.   RECENT LABS AND TESTS: BMET    Component Value Date/Time   NA 142 10/17/2017 0938   K 4.2 10/17/2017 0938   CL 102 10/17/2017 4259  CO2 27 10/17/2017 0938   GLUCOSE 127 (H) 10/17/2017 0938   GLUCOSE 114 (H) 04/10/2012 0731   BUN 12 10/17/2017 0938   CREATININE 0.79 10/17/2017 0938   CALCIUM 9.2 10/17/2017 0938   GFRNONAA 81 10/17/2017 0938   GFRAA 93 10/17/2017 0938   Lab Results  Component Value Date   HGBA1C 6.3 (H) 10/17/2017   Lab Results  Component Value Date   INSULIN 25.3 (H) 10/17/2017   CBC    Component Value Date/Time   WBC 5.8 10/17/2017 0938   WBC 6.8 09/07/2010 0356   RBC 4.53 10/17/2017 0938   RBC 4.90 09/07/2010 0356   HGB 12.2 10/17/2017 0938   HCT 37.5 10/17/2017 0938   PLT 235 09/07/2010 0356   MCV 83 10/17/2017 0938   MCH 26.9 10/17/2017 0938   MCH 26.3 09/07/2010 0356   MCHC 32.5 10/17/2017 0938   MCHC 33.0 09/07/2010 0356   RDW 15.7 (H) 10/17/2017 0938   LYMPHSABS 2.2 10/17/2017 0938   MONOABS 0.5 09/07/2010 0356   EOSABS 0.3 10/17/2017 0938   BASOSABS 0.0 10/17/2017 0938   Iron/TIBC/Ferritin/ %Sat No results found for: IRON, TIBC, FERRITIN, IRONPCTSAT Lipid Panel     Component Value Date/Time   CHOL 186 10/17/2017 0938   TRIG 134 10/17/2017 0938   HDL 40 10/17/2017 0938   LDLCALC 119 (H) 10/17/2017 0938   Hepatic Function Panel     Component Value Date/Time   PROT 7.1 10/17/2017 0938   ALBUMIN 4.0 10/17/2017 0938   AST 27 10/17/2017 0938   ALT 35 (H) 10/17/2017 0938   ALKPHOS 88  10/17/2017 0938   BILITOT 0.3 10/17/2017 0938      Component Value Date/Time   TSH 2.920 10/17/2017 0938  Results for ROCHELE, LUECK (MRN [id number]) as of 11/13/2017 13:06  Ref. 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 Korianna was educated on her risk of developing NASH or even liver failure and th only proven treatment for NAFLD was weight loss. Shawndra agreed to continue with her weight loss efforts with healthier diet and exercise as an essential part of her treatment plan. We will recheck labs in 3 months and Nattie agrees to follow up with our clinic in 2 weeks.  Vitamin D Deficiency Brandy Love was informed that low vitamin D levels contributes to fatigue and are associated with obesity, breast, and colon cancer. Brandy Love agrees to start prescription Vit D @50 ,000 IU every week #4 with no refills. She will follow up for routine testing of vitamin D, at least 2-3 times per year. She was informed of the risk of over-replacement of vitamin D and agrees to not increase her dose unless she discusses this with [place] first. Brandy Love agrees to follow up with our clinic in 2 weeks.  Pre-Diabetes Brandy Love will continue to work on weight loss, diet, exercise, and decreasing simple carbohydrates in her diet to help decrease the risk of diabetes. We dicussed metformin including  benefits and risks. She was informed that eating too many simple carbohydrates or too  many calories at one sitting increases the likelihood of GI side effects. Christol declined metformin for now and a prescription was not written today. Arabella agrees to follow up with our clinic in 2 weeks as directed to monitor her progress.  Diabetes risk counselling Brandy Love was given extended (30 minutes) diabetes prevention counseling today. She is 62 y.o. female and has risk factors for diabetes including obesity and pre-diabetes. We discussed intensive lifestyle modifications today with an emphasis on weight loss as well as increasing exercise and decreasing simple carbohydrates in her diet.  Hypertension We discussed sodium restriction, working on healthy weight loss, and a regular exercise program as the means to achieve improved blood pressure control. Brandy Love agreed with this plan and agreed to follow up as directed. We will continue to monitor her blood pressure as well as her progress with the above lifestyle modifications. Brandy Love agrees to start chlorthalidone 25 mg qd #30 with no refills. She will watch for signs of hypotension as she continues her lifestyle modifications. Brandy Love agrees to follow up with our clinic in 2 weeks.  Obesity Brandy Love is currently in the action stage of change. As such, her goal is to continue with weight loss efforts She has agreed to follow the Category 3 plan Brandy Love has been instructed to work up to a goal of 150 minutes of combined cardio and strengthening exercise per week for weight loss and overall health benefits. We discussed the following Behavioral Modification Strategies today: increasing lean protein intake and decreasing simple carbohydrates  Kymberley is to start on Category 3 plan as she feels better, we discussed blander options on her plan.   Natania has agreed to follow up with our clinic in 2 weeks. She was informed of the importance of frequent follow up visits to maximize her success with intensive lifestyle modifications for her multiple health  conditions.   OBESITY BEHAVIORAL INTERVENTION VISIT  Today's visit was # 2 out of 22.  Starting weight: 273 lbs Starting date: 10/17/17 Today's weight : 272 lbs  Today's date: 11/08/2017 Total lbs lost to date: 1 (Patients must lose 7 lbs in the first 6 months to continue with counseling)   ASK: We discussed the diagnosis of obesity with Nelda Marseille today and Maile agreed to give [place] permission to discuss obesity behavioral modification therapy today.  ASSESS: Fawne has the diagnosis of obesity and her BMI today is 42.59 Abel is in the action stage of change   ADVISE: Zanai was educated on the multiple health risks of obesity as well as the benefit of weight loss to improve her health. She was advised of the need for long term treatment and the importance of lifestyle modifications.  AGREE: Multiple dietary modification options and treatment options were discussed and  Kristinia agreed to the above obesity treatment plan.  I, Trixie Dredge, am acting as transcriptionist for [name] Nip, MD  I have reviewed the above documentation for accuracy and completeness, and I agree with the above. -[name] Nip, MD

## 2017-11-22 DIAGNOSIS — Z961 Presence of intraocular lens: Secondary | ICD-10-CM | POA: Diagnosis not present

## 2017-11-22 DIAGNOSIS — H5022 Vertical strabismus, left eye: Secondary | ICD-10-CM | POA: Diagnosis not present

## 2017-11-27 ENCOUNTER — Ambulatory Visit (INDEPENDENT_AMBULATORY_CARE_PROVIDER_SITE_OTHER): Payer: 59 | Admitting: Family Medicine

## 2017-11-27 VITALS — BP 138/79 | HR 88 | Temp 97.5°F | Ht 67.0 in | Wt 265.0 lb

## 2017-11-27 DIAGNOSIS — Z6841 Body Mass Index (BMI) 40.0 and over, adult: Secondary | ICD-10-CM

## 2017-11-27 DIAGNOSIS — Z9189 Other specified personal risk factors, not elsewhere classified: Secondary | ICD-10-CM

## 2017-11-27 DIAGNOSIS — E559 Vitamin D deficiency, unspecified: Secondary | ICD-10-CM | POA: Diagnosis not present

## 2017-11-27 DIAGNOSIS — I10 Essential (primary) hypertension: Secondary | ICD-10-CM | POA: Diagnosis not present

## 2017-11-27 MED ORDER — CHLORTHALIDONE 25 MG PO TABS: 25.0000 mg | ORAL_TABLET | Freq: Every day | ORAL | 0 refills | Status: DC

## 2017-11-27 MED ORDER — VITAMIN D (ERGOCALCIFEROL) 1.25 MG (50000 UNIT) PO CAPS: 50000.0000 [IU] | ORAL_CAPSULE | ORAL | 0 refills | Status: DC

## 2017-11-27 NOTE — Progress Notes (Signed)
Office: [phone]  /  Fax: [phone]   HPI:   Chief Complaint: OBESITY Brandy Love is here to discuss her progress with her obesity treatment plan. She is on the Category 3 plan and is following her eating plan approximately 20 % of the time. She states she is walking for 10 to 15 minutes 5 to 6 times per week. Brandy Love continues to do very well with weight loss, but she isn't following her plan as closely while undergoing treatment for her gastric ulcer. Spicy foods are especially uncomfortable. Her weight is 265 lb (120.2 kg) today and has had a weight loss of 7 pounds over a period of 2 to 3 weeks since her last visit. She has lost 8 lbs since starting treatment with [place].  Vitamin D deficiency Brandy Love has a diagnosis of vitamin D deficiency. Brandy Love is stable on vit D but is not yet at goal. Brandy Love denies nausea, vomiting or muscle weakness.  Hypertension Brandy Love is a 62 y.o. female with hypertension. Brandy Love denies chest pain or headache. She is working weight loss to help control her blood pressure with the goal of decreasing her risk of heart attack and stroke. Brandy Love blood pressure is controlled on chlorthalidone and losartan.  At risk for cardiovascular disease Brandy Love is at a higher than average risk for cardiovascular disease due to obesity and hypertension. She currently denies any chest pain.  ALLERGIES: Allergies  Allergen Reactions  . Adhesive [Tape]     Rash with tape from first stage interstim  . Ibuprofen     REACTION: vertigo  . Amlodipine Hives, Swelling and Rash  . Betadine [Povidone Iodine] Rash     ALL TOPICAL IODINES    MEDICATIONS: Current Outpatient Medications on File Prior to Visit  Medication Sig Dispense Refill  . albuterol (PROVENTIL HFA;VENTOLIN HFA) 108 (90 Base) MCG/ACT inhaler Inhale into the lungs every 6 (six) hours as needed for wheezing or shortness of breath.    . beclomethasone (QVAR) 80 MCG/ACT inhaler Inhale 2 puffs into the lungs 2 (two)  times daily.    [name] Clocortolone Pivalate (CLODERM) 0.1 % cream Apply 1 application topically 2 (two) times daily.    [name] dexlansoprazole (DEXILANT) 60 MG capsule Take 60 mg by mouth daily.    [name] losartan (COZAAR) 100 MG tablet Take 100 mg by mouth daily.    . Multiple Vitamins-Minerals (MULTIVITAMIN WITH MINERALS) tablet Take 1 tablet by mouth daily.    . sertraline (ZOLOFT) 100 MG tablet Take 100 mg by mouth 2 (two) times daily.    . sucralfate (CARAFATE) 1 g tablet Take 1 g by mouth 4 (four) times daily -  with meals and at bedtime.    . tolterodine (DETROL LA) 4 MG 24 hr capsule Take 4 mg by mouth daily.     Current Facility-Administered Medications on File Prior to Visit  Medication Dose Route Frequency Provider Last Rate Last Dose  . triamcinolone acetonide (KENALOG) 10 MG/ML injection 10 mg  10 mg Other Once Harriet Masson, DPM      . triamcinolone acetonide (KENALOG) 10 MG/ML injection 10 mg  10 mg Other Once Harriet Masson, DPM      . triamcinolone acetonide (KENALOG) 10 MG/ML injection 10 mg  10 mg Other Once Harriet Masson, DPM        PAST MEDICAL HISTORY: Past Medical History:  Diagnosis Date  . Asthma    controlled well  . Bladder prolapse, female, acquired   . Edema extremities  lower legs at times  . Gastric ulcer   . GERD (gastroesophageal reflux disease)   . Glaucoma   . High blood pressure   . OAB (overactive bladder)   . Prediabetes     PAST SURGICAL HISTORY: Past Surgical History:  Procedure Laterality Date  . ABDOMINAL HYSTERECTOMY  1993  . APPENDECTOMY    . CATARACT EXTRACTION  2016  . CYSTECTOMY     1994 and 1982  . INTERSTIM IMPLANT PLACEMENT  04/10/2012   Procedure: Barrie Lyme IMPLANT FIRST STAGE;  Surgeon: [name], MD;  Location: [facility];  Service: Urology;  Laterality: N/A;  rad tech ok per vicki at [facility]   . INTERSTIM IMPLANT PLACEMENT  04/10/2012   Procedure: Barrie Lyme IMPLANT SECOND STAGE;  Surgeon: [name],  MD;  Location: [facility];  Service: Urology;  Laterality: N/A;  . LOWER LEG SOFT TISSUE TUMOR EXCISION  1994   cyst left ankle -involved muscle removed-limited mobility now  . NASAL SEPTUM SURGERY      SOCIAL HISTORY: Social History   Tobacco Use  . Smoking status: Never Smoker  . Smokeless tobacco: Never Used  Substance Use Topics  . Alcohol use: No  . Drug use: No    FAMILY HISTORY: Family History  Problem Relation Age of Onset  . Asthma Mother   . Diabetes Mother   . Hernia Mother   . Obesity Mother   . Heart disease Father   . Prostate cancer Father     ROS: Review of Systems  Constitutional: Positive for weight loss.  Cardiovascular: Negative for chest pain.  Gastrointestinal: Negative for nausea and vomiting.  Musculoskeletal:       Negative for muscle weakness  Neurological: Negative for headaches.    PHYSICAL EXAM: Blood pressure 138/79, pulse 88, temperature (!) 97.5 F (36.4 C), temperature source Oral, height 5\' 7"  (1.702 m), weight 265 lb (120.2 kg), SpO2 97 %. Body mass index is 41.5 kg/m. Physical Exam  Constitutional: She is oriented to person, place, and time. She appears well-developed and well-nourished.  Cardiovascular: Normal rate.  Pulmonary/Chest: Effort normal.  Musculoskeletal: Normal range of motion.  Neurological: She is oriented to person, place, and time.  Skin: Skin is warm and dry.  Psychiatric: She has a normal mood and affect. Her behavior is normal.  Vitals reviewed.   RECENT LABS AND TESTS: BMET    Component Value Date/Time   NA 142 10/17/2017 0938   K 4.2 10/17/2017 0938   CL 102 10/17/2017 0938   CO2 27 10/17/2017 0938   GLUCOSE 127 (H) 10/17/2017 0938   GLUCOSE 114 (H) 04/10/2012 0731   BUN 12 10/17/2017 0938   CREATININE 0.79 10/17/2017 0938   CALCIUM 9.2 10/17/2017 0938   GFRNONAA 81 10/17/2017 0938   GFRAA 93 10/17/2017 0938   Lab Results  Component Value Date   HGBA1C 6.3 (H) 10/17/2017     Lab Results  Component Value Date   INSULIN 25.3 (H) 10/17/2017   CBC    Component Value Date/Time   WBC 5.8 10/17/2017 0938   WBC 6.8 09/07/2010 0356   RBC 4.53 10/17/2017 0938   RBC 4.90 09/07/2010 0356   HGB 12.2 10/17/2017 0938   HCT 37.5 10/17/2017 0938   PLT 235 09/07/2010 0356   MCV 83 10/17/2017 0938   MCH 26.9 10/17/2017 0938   MCH 26.3 09/07/2010 0356   MCHC 32.5 10/17/2017 0938   MCHC 33.0 09/07/2010 0356   RDW 15.7 (  H) 10/17/2017 0938   LYMPHSABS 2.2 10/17/2017 0938   MONOABS 0.5 09/07/2010 0356   EOSABS 0.3 10/17/2017 0938   BASOSABS 0.0 10/17/2017 0938   Iron/TIBC/Ferritin/ %Sat No results found for: IRON, TIBC, FERRITIN, IRONPCTSAT Lipid Panel     Component Value Date/Time   CHOL 186 10/17/2017 0938   TRIG 134 10/17/2017 0938   HDL 40 10/17/2017 0938   LDLCALC 119 (H) 10/17/2017 0938   Hepatic Function Panel     Component Value Date/Time   PROT 7.1 10/17/2017 0938   ALBUMIN 4.0 10/17/2017 0938   AST 27 10/17/2017 0938   ALT 35 (H) 10/17/2017 0938   ALKPHOS 88 10/17/2017 0938   BILITOT 0.3 10/17/2017 0938      Component Value Date/Time   TSH 2.920 10/17/2017 0938   Results for AMBERLE, LYTER (MRN [id number]) as of 11/27/2017 15:28  Ref. Range 10/17/2017 09:38  Vitamin D, 25-Hydroxy Latest Ref Range: 30.0 - 100.0 ng/mL 22.4 (L)   ASSESSMENT AND PLAN: Vitamin D deficiency - Plan: Vitamin D, Ergocalciferol, (DRISDOL) 50000 units CAPS capsule  Essential hypertension - Plan: chlorthalidone (HYGROTON) 25 MG tablet  At risk for heart disease  Class 3 severe obesity with serious comorbidity and body mass index (BMI) of 40.0 to 44.9 in adult, unspecified obesity type (HCC)  PLAN:  Vitamin D Deficiency Brandy Love was informed that low vitamin D levels contributes to fatigue and are associated with obesity, breast, and colon cancer. She agrees to continue to take prescription Vit D @50 ,000 IU every week #4 with no refills and will follow up for routine  testing of vitamin D, at least 2-3 times per year. She was informed of the risk of over-replacement of vitamin D and agrees to not increase her dose unless she discusses this with [place] first. Brandy Love agrees to follow up as directed.  Hypertension We discussed sodium restriction, working on healthy weight loss, and a regular exercise program as the means to achieve improved blood pressure control. Brandy Love agreed with this plan and agreed to follow up as directed. We will continue to monitor her blood pressure as well as her progress with the above lifestyle modifications. She agrees to continue chlorthalidone 25 mg qd #30 with no refills and continue Losartan as prescribed. Brandy Love will watch for signs of hypotension as she continues her lifestyle modifications.  Cardiovascular risk counseling Brandy Love was given extended (15 minutes) coronary artery disease prevention counseling today. She is 62 y.o. female and has risk factors for heart disease including obesity and hypertension. We discussed intensive lifestyle modifications today with an emphasis on specific weight loss instructions and strategies. Pt was also informed of the importance of increasing exercise and decreasing saturated fats to help prevent heart disease.  Obesity Brandy Love is currently in the action stage of change. As such, her goal is to continue with weight loss efforts She has agreed to follow the Category 3 plan Brandy Love has been instructed to work up to a goal of 150 minutes of combined cardio and strengthening exercise per week for weight loss and overall health benefits. We discussed the following Behavioral Modification Strategies today: increase H2O intake, increasing lean protein intake, decreasing simple carbohydrates , decreasing sodium intake, work on meal planning and easy cooking plans and holiday eating strategies   Brandy Love has agreed to follow up with our clinic in 3 weeks. She was informed of the importance of frequent follow up visits to  maximize her success with intensive lifestyle modifications for her multiple health conditions.  OBESITY BEHAVIORAL INTERVENTION VISIT  Today's visit was # 3 out of 22.  Starting weight: 273 lbs Starting date: 10/17/17 Today's weight : 265 lbs  Today's date: 11/27/2017 Total lbs lost to date: 8 (Patients must lose 7 lbs in the first 6 months to continue with counseling)   ASK: We discussed the diagnosis of obesity with Brandy Love today and Brandy Love agreed to give [place] permission to discuss obesity behavioral modification therapy today.  ASSESS: Brandy Love has the diagnosis of obesity and her BMI today is 41.5 Brandy Love is in the action stage of change   ADVISE: Conda was educated on the multiple health risks of obesity as well as the benefit of weight loss to improve her health. She was advised of the need for long term treatment and the importance of lifestyle modifications.  AGREE: Multiple dietary modification options and treatment options were discussed and  Zeppelin agreed to the above obesity treatment plan.  I, Doreene Nest, am acting as transcriptionist for [name] Nip, MD  I have reviewed the above documentation for accuracy and completeness, and I agree with the above. -[name] Nip, MD

## 2017-12-18 ENCOUNTER — Ambulatory Visit (INDEPENDENT_AMBULATORY_CARE_PROVIDER_SITE_OTHER): Payer: 59 | Admitting: Family Medicine

## 2017-12-18 VITALS — BP 126/76 | HR 76 | Temp 98.0°F | Ht 67.0 in | Wt 263.0 lb

## 2017-12-18 DIAGNOSIS — G4709 Other insomnia: Secondary | ICD-10-CM | POA: Diagnosis not present

## 2017-12-18 DIAGNOSIS — Z6841 Body Mass Index (BMI) 40.0 and over, adult: Secondary | ICD-10-CM

## 2017-12-18 DIAGNOSIS — E66813 Obesity, class 3: Secondary | ICD-10-CM

## 2017-12-18 NOTE — Progress Notes (Signed)
Office: [phone]  /  Fax: [phone]   HPI:   Chief Complaint: OBESITY Brandy Love is here to discuss her progress with her obesity treatment plan. She is on the Category 3 plan and is following her eating plan approximately 75 % of the time. She states she is walking for 15-20 minutes 5 times per week. [name] continues to do well with weight loss. Hunger is controlled and she has done better with meal prepping and planning. She has increased walking.  Her weight is 263 lb (119.3 kg) today and has had a weight loss of 2 pounds over a period of 3 weeks since her last visit. She has lost 10 lbs since starting treatment with [place].  Insomnia [name] falls asleep ok but wakes up frequently and struggles to fall back to sleep. This has been present for years and not working recently.  ALLERGIES: Allergies  Allergen Reactions  . Adhesive [Tape]     Rash with tape from first stage interstim  . Ibuprofen     REACTION: vertigo  . Amlodipine Hives, Swelling and Rash  . Betadine [Povidone Iodine] Rash     ALL TOPICAL IODINES    MEDICATIONS: Current Outpatient Medications on File Prior to Visit  Medication Sig Dispense Refill  . albuterol (PROVENTIL HFA;VENTOLIN HFA) 108 (90 Base) MCG/ACT inhaler Inhale into the lungs every 6 (six) hours as needed for wheezing or shortness of breath.    . beclomethasone (QVAR) 80 MCG/ACT inhaler Inhale 2 puffs into the lungs 2 (two) times daily.    . chlorthalidone (HYGROTON) 25 MG tablet Take 1 tablet (25 mg total) by mouth daily. 30 tablet 0  . Clocortolone Pivalate (CLODERM) 0.1 % cream Apply 1 application topically 2 (two) times daily.    [name] dexlansoprazole (DEXILANT) 60 MG capsule Take 60 mg by mouth daily.    [name] losartan (COZAAR) 100 MG tablet Take 100 mg by mouth daily.    . Multiple Vitamins-Minerals (MULTIVITAMIN WITH MINERALS) tablet Take 1 tablet by mouth daily.    . sertraline (ZOLOFT) 100 MG tablet Take 100 mg by mouth 2 (two) times daily.    [name]  tolterodine (DETROL LA) 4 MG 24 hr capsule Take 4 mg by mouth daily.    . Vitamin D, Ergocalciferol, (DRISDOL) 50000 units CAPS capsule Take 1 capsule (50,000 Units total) by mouth every 7 (seven) days. 4 capsule 0   No current facility-administered medications on file prior to visit.     PAST MEDICAL HISTORY: Past Medical History:  Diagnosis Date  . Asthma    controlled well  . Bladder prolapse, female, acquired   . Edema extremities    lower legs at times  . Gastric ulcer   . GERD (gastroesophageal reflux disease)   . Glaucoma   . High blood pressure   . OAB (overactive bladder)   . Prediabetes     PAST SURGICAL HISTORY: Past Surgical History:  Procedure Laterality Date  . ABDOMINAL HYSTERECTOMY  1993  . APPENDECTOMY    . CATARACT EXTRACTION  2016  . CYSTECTOMY     1994 and 1982  . INTERSTIM IMPLANT PLACEMENT  04/10/2012   Procedure: Barrie Lyme IMPLANT FIRST STAGE;  Surgeon: [name], MD;  Location: [facility];  Service: Urology;  Laterality: N/A;  rad tech ok per vicki at [facility]   . INTERSTIM IMPLANT PLACEMENT  04/10/2012   Procedure: Barrie Lyme IMPLANT SECOND STAGE;  Surgeon: [name], MD;  Location: [facility];  Service:  Urology;  Laterality: N/A;  . LOWER LEG SOFT TISSUE TUMOR EXCISION  1994   cyst left ankle -involved muscle removed-limited mobility now  . NASAL SEPTUM SURGERY      SOCIAL HISTORY: Social History   Tobacco Use  . Smoking status: Never Smoker  . Smokeless tobacco: Never Used  Substance Use Topics  . Alcohol use: No  . Drug use: No    FAMILY HISTORY: Family History  Problem Relation Age of Onset  . Asthma Mother   . Diabetes Mother   . Hernia Mother   . Obesity Mother   . Heart disease Father   . Prostate cancer Father     ROS: Review of Systems  Constitutional: Positive for weight loss.  Psychiatric/Behavioral: The patient has insomnia.     PHYSICAL EXAM: Blood pressure 126/76,  pulse 76, temperature 98 F (36.7 C), temperature source Oral, height 5\' 7"  (1.702 m), weight 263 lb (119.3 kg), SpO2 97 %. Body mass index is 41.19 kg/m. Physical Exam  Constitutional: She is oriented to person, place, and time. She appears well-developed and well-nourished.  Cardiovascular: Normal rate.  Pulmonary/Chest: Effort normal.  Musculoskeletal: Normal range of motion.  Neurological: She is oriented to person, place, and time.  Skin: Skin is warm and dry.  Psychiatric: She has a normal mood and affect. Her behavior is normal.  Vitals reviewed.   RECENT LABS AND TESTS: BMET    Component Value Date/Time   NA 142 10/17/2017 0938   K 4.2 10/17/2017 0938   CL 102 10/17/2017 0938   CO2 27 10/17/2017 0938   GLUCOSE 127 (H) 10/17/2017 0938   GLUCOSE 114 (H) 04/10/2012 0731   BUN 12 10/17/2017 0938   CREATININE 0.79 10/17/2017 0938   CALCIUM 9.2 10/17/2017 0938   GFRNONAA 81 10/17/2017 0938   GFRAA 93 10/17/2017 0938   Lab Results  Component Value Date   HGBA1C 6.3 (H) 10/17/2017   Lab Results  Component Value Date   INSULIN 25.3 (H) 10/17/2017   CBC    Component Value Date/Time   WBC 5.8 10/17/2017 0938   WBC 6.8 09/07/2010 0356   RBC 4.53 10/17/2017 0938   RBC 4.90 09/07/2010 0356   HGB 12.2 10/17/2017 0938   HCT 37.5 10/17/2017 0938   PLT 235 09/07/2010 0356   MCV 83 10/17/2017 0938   MCH 26.9 10/17/2017 0938   MCH 26.3 09/07/2010 0356   MCHC 32.5 10/17/2017 0938   MCHC 33.0 09/07/2010 0356   RDW 15.7 (H) 10/17/2017 0938   LYMPHSABS 2.2 10/17/2017 0938   MONOABS 0.5 09/07/2010 0356   EOSABS 0.3 10/17/2017 0938   BASOSABS 0.0 10/17/2017 0938   Iron/TIBC/Ferritin/ %Sat No results found for: IRON, TIBC, FERRITIN, IRONPCTSAT Lipid Panel     Component Value Date/Time   CHOL 186 10/17/2017 0938   TRIG 134 10/17/2017 0938   HDL 40 10/17/2017 0938   LDLCALC 119 (H) 10/17/2017 0938   Hepatic Function Panel     Component Value Date/Time   PROT 7.1  10/17/2017 0938   ALBUMIN 4.0 10/17/2017 0938   AST 27 10/17/2017 0938   ALT 35 (H) 10/17/2017 0938   ALKPHOS 88 10/17/2017 0938   BILITOT 0.3 10/17/2017 0938      Component Value Date/Time   TSH 2.920 10/17/2017 0938    ASSESSMENT AND PLAN: Other insomnia  Class 3 severe obesity with serious comorbidity and body mass index (BMI) of 40.0 to 44.9 in adult, unspecified obesity type ([place])  PLAN:  Insomnia We discussed sleep hygiene strategies especially exercising earlier in the day. Modupe agrees to follow up with our clinic in 2 to 3 weeks.  We spent > than 50% of the 30 minute visit on the counseling as documented in the note.  Obesity Aspin is currently in the action stage of change. As such, her goal is to continue with weight loss efforts She has agreed to follow the Category 3 plan Stacia has been instructed to work up to a goal of 150 minutes of combined cardio and strengthening exercise per week for weight loss and overall health benefits. We discussed the following Behavioral Modification Strategies today: increasing lean protein intake and decreasing simple carbohydrates    Waylon has agreed to follow up with our clinic in 2 to 3 weeks. She was informed of the importance of frequent follow up visits to maximize her success with intensive lifestyle modifications for her multiple health conditions.   OBESITY BEHAVIORAL INTERVENTION VISIT  Today's visit was # 4 out of 22.  Starting weight: 273 lbs Starting date: 10/17/17 Today's weight : 263 lbs  Today's date: 12/18/2017 Total lbs lost to date: 10 (Patients must lose 7 lbs in the first 6 months to continue with counseling)   ASK: We discussed the diagnosis of obesity with Nelda Marseille today and Samone agreed to give [place] permission to discuss obesity behavioral modification therapy today.  ASSESS: Haila has the diagnosis of obesity and her BMI today is 41.18 Fama is in the action stage of change   ADVISE: Lyly was  educated on the multiple health risks of obesity as well as the benefit of weight loss to improve her health. She was advised of the need for long term treatment and the importance of lifestyle modifications.  AGREE: Multiple dietary modification options and treatment options were discussed and  Jahayra agreed to the above obesity treatment plan.  I, Trixie Dredge, am acting as transcriptionist for [name] Nip, MD  I have reviewed the above documentation for accuracy and completeness, and I agree with the above. -[name] Nip, MD

## 2017-12-28 DIAGNOSIS — H401131 Primary open-angle glaucoma, bilateral, mild stage: Secondary | ICD-10-CM | POA: Diagnosis not present

## 2017-12-28 DIAGNOSIS — Z961 Presence of intraocular lens: Secondary | ICD-10-CM | POA: Diagnosis not present

## 2017-12-28 DIAGNOSIS — H04123 Dry eye syndrome of bilateral lacrimal glands: Secondary | ICD-10-CM | POA: Diagnosis not present

## 2018-01-01 ENCOUNTER — Other Ambulatory Visit (INDEPENDENT_AMBULATORY_CARE_PROVIDER_SITE_OTHER): Payer: Self-pay | Admitting: Family Medicine

## 2018-01-01 DIAGNOSIS — I10 Essential (primary) hypertension: Secondary | ICD-10-CM

## 2018-01-01 DIAGNOSIS — E559 Vitamin D deficiency, unspecified: Secondary | ICD-10-CM

## 2018-01-03 ENCOUNTER — Ambulatory Visit (INDEPENDENT_AMBULATORY_CARE_PROVIDER_SITE_OTHER): Payer: 59 | Admitting: Family Medicine

## 2018-01-03 VITALS — BP 137/83 | HR 76 | Temp 98.1°F | Ht 67.0 in | Wt 264.0 lb

## 2018-01-03 DIAGNOSIS — I10 Essential (primary) hypertension: Secondary | ICD-10-CM

## 2018-01-03 DIAGNOSIS — E559 Vitamin D deficiency, unspecified: Secondary | ICD-10-CM | POA: Diagnosis not present

## 2018-01-03 DIAGNOSIS — Z6841 Body Mass Index (BMI) 40.0 and over, adult: Secondary | ICD-10-CM

## 2018-01-03 DIAGNOSIS — Z9189 Other specified personal risk factors, not elsewhere classified: Secondary | ICD-10-CM

## 2018-01-03 MED ORDER — VITAMIN D (ERGOCALCIFEROL) 1.25 MG (50000 UNIT) PO CAPS: 50000.0000 [IU] | ORAL_CAPSULE | ORAL | 0 refills | Status: DC

## 2018-01-03 MED ORDER — CHLORTHALIDONE 25 MG PO TABS: 25.0000 mg | ORAL_TABLET | Freq: Every day | ORAL | 0 refills | Status: DC

## 2018-01-04 NOTE — Progress Notes (Signed)
Office: [phone]  /  Fax: [phone]   HPI:   Chief Complaint: OBESITY Alta is here to discuss her progress with her obesity treatment plan. She is on the Category 3 plan and is following her eating plan approximately 20 % of the time. She states she is exercising 0 minutes 0 times per week. Brisha has been off track in the last 2 weeks. Her mother is now in hospice and she has been taking care of her. Her weight is 264 lb (119.7 kg) today and has had a weight gain of 1 pound over a period of 2 weeks since her last visit. She has lost 9 lbs since starting treatment with [place].  Vitamin D deficiency Shaquisha has a diagnosis of vitamin D deficiency. Venita is stable on vit D, but she is not yet at goal. Hanaan denies nausea, vomiting or muscle weakness.  Hypertension JOLENA KITTLE is a 62 y.o. female with hypertension. Unnamed L Therriault denies chest pain or headache. She is working weight loss to help control her blood pressure with the goal of decreasing her risk of heart attack and stroke. [name] blood pressure is stable on medications and diet.  At risk for cardiovascular disease Rozann is at a higher than average risk for cardiovascular disease due to obesity and hypertension. She currently denies any chest pain.  ALLERGIES: Allergies  Allergen Reactions  . Adhesive [Tape]     Rash with tape from first stage interstim  . Ibuprofen     REACTION: vertigo  . Amlodipine Hives, Swelling and Rash  . Betadine [Povidone Iodine] Rash     ALL TOPICAL IODINES    MEDICATIONS: Current Outpatient Medications on File Prior to Visit  Medication Sig Dispense Refill  . albuterol (PROVENTIL HFA;VENTOLIN HFA) 108 (90 Base) MCG/ACT inhaler Inhale into the lungs every 6 (six) hours as needed for wheezing or shortness of breath.    . beclomethasone (QVAR) 80 MCG/ACT inhaler Inhale 2 puffs into the lungs 2 (two) times daily.    [name] Clocortolone Pivalate (CLODERM) 0.1 % cream Apply 1 application topically 2 (two)  times daily.    [name] dexlansoprazole (DEXILANT) 60 MG capsule Take 60 mg by mouth daily.    [name] losartan (COZAAR) 100 MG tablet Take 100 mg by mouth daily.    . Multiple Vitamins-Minerals (MULTIVITAMIN WITH MINERALS) tablet Take 1 tablet by mouth daily.    . sertraline (ZOLOFT) 100 MG tablet Take 100 mg by mouth 2 (two) times daily.    [name] tolterodine (DETROL LA) 4 MG 24 hr capsule Take 4 mg by mouth daily.     No current facility-administered medications on file prior to visit.     PAST MEDICAL HISTORY: Past Medical History:  Diagnosis Date  . Asthma    controlled well  . Bladder prolapse, female, acquired   . Edema extremities    lower legs at times  . Gastric ulcer   . GERD (gastroesophageal reflux disease)   . Glaucoma   . High blood pressure   . OAB (overactive bladder)   . Prediabetes     PAST SURGICAL HISTORY: Past Surgical History:  Procedure Laterality Date  . ABDOMINAL HYSTERECTOMY  1993  . APPENDECTOMY    . CATARACT EXTRACTION  2016  . CYSTECTOMY     1994 and 1982  . INTERSTIM IMPLANT PLACEMENT  04/10/2012   Procedure: Barrie Lyme IMPLANT FIRST STAGE;  Surgeon: [name], MD;  Location: [facility];  Service: Urology;  Laterality:  N/A;  rad tech ok per vicki at [facility]   . INTERSTIM IMPLANT PLACEMENT  04/10/2012   Procedure: Barrie Lyme IMPLANT SECOND STAGE;  Surgeon: [name], MD;  Location: [facility];  Service: Urology;  Laterality: N/A;  . LOWER LEG SOFT TISSUE TUMOR EXCISION  1994   cyst left ankle -involved muscle removed-limited mobility now  . NASAL SEPTUM SURGERY      SOCIAL HISTORY: Social History   Tobacco Use  . Smoking status: Never Smoker  . Smokeless tobacco: Never Used  Substance Use Topics  . Alcohol use: No  . Drug use: No    FAMILY HISTORY: Family History  Problem Relation Age of Onset  . Asthma Mother   . Diabetes Mother   . Hernia Mother   . Obesity Mother   . Heart disease Father   .  Prostate cancer Father     ROS: Review of Systems  Constitutional: Negative for weight loss.  Cardiovascular: Negative for chest pain.  Gastrointestinal: Negative for nausea and vomiting.  Musculoskeletal:       Negative for muscle weakness  Neurological: Negative for headaches.    PHYSICAL EXAM: Blood pressure 137/83, pulse 76, temperature 98.1 F (36.7 C), temperature source Oral, height 5\' 7"  (1.702 m), weight 264 lb (119.7 kg), SpO2 98 %. Body mass index is 41.35 kg/m. Physical Exam  Constitutional: She is oriented to person, place, and time. She appears well-developed and well-nourished.  Cardiovascular: Normal rate.  Pulmonary/Chest: Effort normal.  Musculoskeletal: Normal range of motion.  Neurological: She is oriented to person, place, and time.  Skin: Skin is warm and dry.  Psychiatric: She has a normal mood and affect. Her behavior is normal.  Vitals reviewed.   RECENT LABS AND TESTS: BMET    Component Value Date/Time   NA 142 10/17/2017 0938   K 4.2 10/17/2017 0938   CL 102 10/17/2017 0938   CO2 27 10/17/2017 0938   GLUCOSE 127 (H) 10/17/2017 0938   GLUCOSE 114 (H) 04/10/2012 0731   BUN 12 10/17/2017 0938   CREATININE 0.79 10/17/2017 0938   CALCIUM 9.2 10/17/2017 0938   GFRNONAA 81 10/17/2017 0938   GFRAA 93 10/17/2017 0938   Lab Results  Component Value Date   HGBA1C 6.3 (H) 10/17/2017   Lab Results  Component Value Date   INSULIN 25.3 (H) 10/17/2017   CBC    Component Value Date/Time   WBC 5.8 10/17/2017 0938   WBC 6.8 09/07/2010 0356   RBC 4.53 10/17/2017 0938   RBC 4.90 09/07/2010 0356   HGB 12.2 10/17/2017 0938   HCT 37.5 10/17/2017 0938   PLT 235 09/07/2010 0356   MCV 83 10/17/2017 0938   MCH 26.9 10/17/2017 0938   MCH 26.3 09/07/2010 0356   MCHC 32.5 10/17/2017 0938   MCHC 33.0 09/07/2010 0356   RDW 15.7 (H) 10/17/2017 0938   LYMPHSABS 2.2 10/17/2017 0938   MONOABS 0.5 09/07/2010 0356   EOSABS 0.3 10/17/2017 0938   BASOSABS 0.0  10/17/2017 0938   Iron/TIBC/Ferritin/ %Sat No results found for: IRON, TIBC, FERRITIN, IRONPCTSAT Lipid Panel     Component Value Date/Time   CHOL 186 10/17/2017 0938   TRIG 134 10/17/2017 0938   HDL 40 10/17/2017 0938   LDLCALC 119 (H) 10/17/2017 0938   Hepatic Function Panel     Component Value Date/Time   PROT 7.1 10/17/2017 0938   ALBUMIN 4.0 10/17/2017 0938   AST 27 10/17/2017 0938   ALT 35 (H) 10/17/2017  6195   KDTOIZT 24 10/17/2017 0938   BILITOT 0.3 10/17/2017 0938      Component Value Date/Time   TSH 2.920 10/17/2017 0938   Results for DEVONY, MCGRADY (MRN [id number]) as of 01/04/2018 08:47  Ref. Range 10/17/2017 09:38  Vitamin D, 25-Hydroxy Latest Ref Range: 30.0 - 100.0 ng/mL 22.4 (L)   ASSESSMENT AND PLAN: Vitamin D deficiency - Plan: Vitamin D, Ergocalciferol, (DRISDOL) 50000 units CAPS capsule  Essential hypertension - Plan: chlorthalidone (HYGROTON) 25 MG tablet  At risk for heart disease  Class 3 severe obesity with serious comorbidity and body mass index (BMI) of 40.0 to 44.9 in adult, unspecified obesity type ([facility])  PLAN:  Vitamin D Deficiency Makenzy was informed that low vitamin D levels contributes to fatigue and are associated with obesity, breast, and colon cancer. She agrees to continue to take prescription Vit D @50 ,000 IU every week #4 with no refills and will follow up for routine testing of vitamin D, at least 2-3 times per year. She was informed of the risk of over-replacement of vitamin D and agrees to not increase her dose unless she discusses this with [place] first. Bryanah agrees to follow up as directed.  Hypertension We discussed sodium restriction, working on healthy weight loss, and a regular exercise program as the means to achieve improved blood pressure control. Thomasa agreed with this plan and agreed to follow up as directed. We will continue to monitor her blood pressure as well as her progress with the above lifestyle modifications. She agrees to  continue Chlorthalidone 25 mg daily #30 with no refills and will watch for signs of hypotension as she continues her lifestyle modifications.  Cardiovascular risk counseling Sherolyn was given extended (15 minutes) coronary artery disease prevention counseling today. She is 62 y.o. female and has risk factors for heart disease including obesity and hypertension. We discussed intensive lifestyle modifications today with an emphasis on specific weight loss instructions and strategies. Pt was also informed of the importance of increasing exercise and decreasing saturated fats to help prevent heart disease.  Obesity Euva is currently in the action stage of change. As such, her goal is to continue with weight loss efforts She has agreed to follow the Category 3 plan Jadalyn has been instructed to work up to a goal of 150 minutes of combined cardio and strengthening exercise per week for weight loss and overall health benefits. We discussed the following Behavioral Modification Strategies today: increasing lean protein intake and decreasing simple carbohydrates   Ivet has agreed to follow up with our clinic in 2 to 3 weeks. She was informed of the importance of frequent follow up visits to maximize her success with intensive lifestyle modifications for her multiple health conditions.   OBESITY BEHAVIORAL INTERVENTION VISIT  Today's visit was # 5 out of 22.  Starting weight: 273 lbs Starting date: 10/17/17 Today's weight : 264 lbs  Today's date: 01/03/2018 Total lbs lost to date: 9 (Patients must lose 7 lbs in the first 6 months to continue with counseling)   ASK: We discussed the diagnosis of obesity with Nelda Marseille today and Gesselle agreed to give [place] permission to discuss obesity behavioral modification therapy today.  ASSESS: Kadija has the diagnosis of obesity and her BMI today is 41.34 Dustina is in the action stage of change   ADVISE: Kyree was educated on the multiple health risks of obesity as  well as the benefit of weight loss to improve her health. She was advised  of the need for long term treatment and the importance of lifestyle modifications.  AGREE: Multiple dietary modification options and treatment options were discussed and  Majel agreed to the above obesity treatment plan.  I, Doreene Nest, am acting as transcriptionist for [name] Nip, MD  I have reviewed the above documentation for accuracy and completeness, and I agree with the above. -[name] Nip, MD

## 2018-01-10 ENCOUNTER — Encounter (INDEPENDENT_AMBULATORY_CARE_PROVIDER_SITE_OTHER): Payer: Self-pay | Admitting: Family Medicine

## 2018-01-15 NOTE — Telephone Encounter (Signed)
Please advise. Thank you

## 2018-01-25 DIAGNOSIS — J4530 Mild persistent asthma, uncomplicated: Secondary | ICD-10-CM | POA: Diagnosis not present

## 2018-01-25 DIAGNOSIS — I10 Essential (primary) hypertension: Secondary | ICD-10-CM | POA: Diagnosis not present

## 2018-01-25 DIAGNOSIS — E119 Type 2 diabetes mellitus without complications: Secondary | ICD-10-CM | POA: Diagnosis not present

## 2018-01-29 ENCOUNTER — Ambulatory Visit (INDEPENDENT_AMBULATORY_CARE_PROVIDER_SITE_OTHER): Payer: Self-pay | Admitting: Family Medicine

## 2018-01-30 ENCOUNTER — Ambulatory Visit (INDEPENDENT_AMBULATORY_CARE_PROVIDER_SITE_OTHER): Payer: 59 | Admitting: Family Medicine

## 2018-01-30 VITALS — BP 120/77 | HR 74 | Temp 98.0°F | Ht 67.0 in | Wt 258.0 lb

## 2018-01-30 DIAGNOSIS — E559 Vitamin D deficiency, unspecified: Secondary | ICD-10-CM

## 2018-01-30 DIAGNOSIS — I10 Essential (primary) hypertension: Secondary | ICD-10-CM | POA: Diagnosis not present

## 2018-01-30 DIAGNOSIS — Z9189 Other specified personal risk factors, not elsewhere classified: Secondary | ICD-10-CM

## 2018-01-30 DIAGNOSIS — Z6841 Body Mass Index (BMI) 40.0 and over, adult: Secondary | ICD-10-CM

## 2018-01-30 MED ORDER — VITAMIN D (ERGOCALCIFEROL) 1.25 MG (50000 UNIT) PO CAPS: 50000.0000 [IU] | ORAL_CAPSULE | ORAL | 0 refills | Status: DC

## 2018-01-30 MED ORDER — CHLORTHALIDONE 25 MG PO TABS: 25.0000 mg | ORAL_TABLET | Freq: Every day | ORAL | 0 refills | Status: DC

## 2018-01-30 NOTE — Progress Notes (Signed)
Office: [phone]  /  Fax: [phone]   HPI:   Chief Complaint: OBESITY Brandy Love is here to discuss her progress with her obesity treatment plan. She is on the Category 3 plan and is following her eating plan approximately 50 % of the time. She states she is walking for 15-20 minutes 5 times per week. Brandy Love did very well with weight loss. She is able to eat all of the food on Category 3.  Her weight is 258 lb (117 kg) today and has had a weight loss of 6 pounds over a period of 4 weeks since her last visit. She has lost 15 lbs since starting treatment with [place].  Vitamin D Deficiency Brandy Love has a diagnosis of vitamin D deficiency. She is on prescription Vit D, last level not at goal. She denies nausea, vomiting or muscle weakness.  Hypertension Brandy Love is a 62 y.o. female with hypertension. Brandy Love's blood pressure is controlled today. She denies chest pain. She is working weight loss to help control her blood pressure with the goal of decreasing her risk of heart attack and stroke.  At risk for cardiovascular disease Brandy Love is at a higher than average risk for cardiovascular disease due to obesity and hypertension. She currently denies any chest pain.  ALLERGIES: Allergies  Allergen Reactions  . Adhesive [Tape]     Rash with tape from first stage interstim  . Ibuprofen     REACTION: vertigo  . Amlodipine Hives, Swelling and Rash  . Betadine [Povidone Iodine] Rash     ALL TOPICAL IODINES    MEDICATIONS: Current Outpatient Medications on File Prior to Visit  Medication Sig Dispense Refill  . albuterol (PROVENTIL HFA;VENTOLIN HFA) 108 (90 Base) MCG/ACT inhaler Inhale into the lungs every 6 (six) hours as needed for wheezing or shortness of breath.    . beclomethasone (QVAR) 80 MCG/ACT inhaler Inhale 2 puffs into the lungs 2 (two) times daily.    [name] Clocortolone Pivalate (CLODERM) 0.1 % cream Apply 1 application topically 2 (two) times daily.    [name] dexlansoprazole (DEXILANT) 60 MG  capsule Take 60 mg by mouth daily.    [name] losartan (COZAAR) 100 MG tablet Take 100 mg by mouth daily.    . Multiple Vitamins-Minerals (MULTIVITAMIN WITH MINERALS) tablet Take 1 tablet by mouth daily.    . sertraline (ZOLOFT) 100 MG tablet Take 100 mg by mouth 2 (two) times daily.    [name] tolterodine (DETROL LA) 4 MG 24 hr capsule Take 4 mg by mouth daily.     No current facility-administered medications on file prior to visit.     PAST MEDICAL HISTORY: Past Medical History:  Diagnosis Date  . Asthma    controlled well  . Bladder prolapse, female, acquired   . Edema extremities    lower legs at times  . Gastric ulcer   . GERD (gastroesophageal reflux disease)   . Glaucoma   . High blood pressure   . OAB (overactive bladder)   . Prediabetes     PAST SURGICAL HISTORY: Past Surgical History:  Procedure Laterality Date  . ABDOMINAL HYSTERECTOMY  1993  . APPENDECTOMY    . CATARACT EXTRACTION  2016  . CYSTECTOMY     1994 and 1982  . INTERSTIM IMPLANT PLACEMENT  04/10/2012   Procedure: Barrie Lyme IMPLANT FIRST STAGE;  Surgeon: [name], MD;  Location: [facility];  Service: Urology;  Laterality: N/A;  rad tech ok per vicki at [place]   .  INTERSTIM IMPLANT PLACEMENT  04/10/2012   Procedure: Barrie Lyme IMPLANT SECOND STAGE;  Surgeon: [name], MD;  Location: [facility];  Service: Urology;  Laterality: N/A;  . LOWER LEG SOFT TISSUE TUMOR EXCISION  1994   cyst left ankle -involved muscle removed-limited mobility now  . NASAL SEPTUM SURGERY      SOCIAL HISTORY: Social History   Tobacco Use  . Smoking status: Never Smoker  . Smokeless tobacco: Never Used  Substance Use Topics  . Alcohol use: No  . Drug use: No    FAMILY HISTORY: Family History  Problem Relation Age of Onset  . Asthma Mother   . Diabetes Mother   . Hernia Mother   . Obesity Mother   . Heart disease Father   . Prostate cancer Father     ROS: Review of Systems    Constitutional: Positive for weight loss.  Cardiovascular: Negative for chest pain.  Gastrointestinal: Negative for nausea and vomiting.  Musculoskeletal:       Negative muscle weakness    PHYSICAL EXAM: Blood pressure 120/77, pulse 74, temperature 98 F (36.7 C), temperature source Oral, height 5\' 7"  (1.702 m), weight 258 lb (117 kg), SpO2 97 %. Body mass index is 40.41 kg/m. Physical Exam  Constitutional: She is oriented to person, place, and time. She appears well-developed and well-nourished.  Cardiovascular: Normal rate.  Pulmonary/Chest: Effort normal.  Musculoskeletal: Normal range of motion.  Neurological: She is oriented to person, place, and time.  Skin: Skin is warm and dry.  Psychiatric: She has a normal mood and affect. Her behavior is normal.  Vitals reviewed.   RECENT LABS AND TESTS: BMET    Component Value Date/Time   NA 142 10/17/2017 0938   K 4.2 10/17/2017 0938   CL 102 10/17/2017 0938   CO2 27 10/17/2017 0938   GLUCOSE 127 (H) 10/17/2017 0938   GLUCOSE 114 (H) 04/10/2012 0731   BUN 12 10/17/2017 0938   CREATININE 0.79 10/17/2017 0938   CALCIUM 9.2 10/17/2017 0938   GFRNONAA 81 10/17/2017 0938   GFRAA 93 10/17/2017 0938   Lab Results  Component Value Date   HGBA1C 6.3 (H) 10/17/2017   Lab Results  Component Value Date   INSULIN 25.3 (H) 10/17/2017   CBC    Component Value Date/Time   WBC 5.8 10/17/2017 0938   WBC 6.8 09/07/2010 0356   RBC 4.53 10/17/2017 0938   RBC 4.90 09/07/2010 0356   HGB 12.2 10/17/2017 0938   HCT 37.5 10/17/2017 0938   PLT 235 09/07/2010 0356   MCV 83 10/17/2017 0938   MCH 26.9 10/17/2017 0938   MCH 26.3 09/07/2010 0356   MCHC 32.5 10/17/2017 0938   MCHC 33.0 09/07/2010 0356   RDW 15.7 (H) 10/17/2017 0938   LYMPHSABS 2.2 10/17/2017 0938   MONOABS 0.5 09/07/2010 0356   EOSABS 0.3 10/17/2017 0938   BASOSABS 0.0 10/17/2017 0938   Iron/TIBC/Ferritin/ %Sat No results found for: IRON, TIBC, FERRITIN,  IRONPCTSAT Lipid Panel     Component Value Date/Time   CHOL 186 10/17/2017 0938   TRIG 134 10/17/2017 0938   HDL 40 10/17/2017 0938   LDLCALC 119 (H) 10/17/2017 0938   Hepatic Function Panel     Component Value Date/Time   PROT 7.1 10/17/2017 0938   ALBUMIN 4.0 10/17/2017 0938   AST 27 10/17/2017 0938   ALT 35 (H) 10/17/2017 0938   ALKPHOS 88 10/17/2017 0938   BILITOT 0.3 10/17/2017 1443  Component Value Date/Time   TSH 2.920 10/17/2017 0938  Results for SENIAH, LAWRENCE (MRN [id number]) as of 01/30/2018 15:21  Ref. Range 10/17/2017 09:38  Vitamin D, 25-Hydroxy Latest Ref Range: 30.0 - 100.0 ng/mL 22.4 (L)    ASSESSMENT AND PLAN: Vitamin D deficiency - Plan: Vitamin D, Ergocalciferol, (DRISDOL) 50000 units CAPS capsule  Essential hypertension - Plan: chlorthalidone (HYGROTON) 25 MG tablet  At risk for heart disease  Class 3 severe obesity with serious comorbidity and body mass index (BMI) of 40.0 to 44.9 in adult, unspecified obesity type (HCC)  PLAN:  Vitamin D Deficiency Brandy Love was informed that low vitamin D levels contributes to fatigue and are associated with obesity, breast, and colon cancer. Brandy Love agrees to continue taking prescription Vit D @50 ,000 IU every week #4 and we will refill for 1 month. She will follow up for routine testing of vitamin D, at least 2-3 times per year. She was informed of the risk of over-replacement of vitamin D and agrees to not increase her dose unless she discusses this with [place] first. We will check labs at next visit. Brandy Love agrees to follow up with our clinic in 3 to 4 weeks.  Hypertension We discussed sodium restriction, working on healthy weight loss, and a regular exercise program as the means to achieve improved blood pressure control. Brandy Love agreed with this plan and agreed to follow up as directed. We will continue to monitor her blood pressure as well as her progress with the above lifestyle modifications. Brandy Love agrees to continue taking  chlorthalidone 25 mg qd #30 and we will refill for 1 month. She will watch for signs of hypotension as she continues her lifestyle modifications. Brandy Love agrees to follow up with our clinic in 3 to 4 weeks.  Cardiovascular risk counselling Brandy Love was given extended (15 minutes) coronary artery disease prevention counseling today. She is 62 y.o. female and has risk factors for heart disease including obesity and hypertension. We discussed intensive lifestyle modifications today with an emphasis on specific weight loss instructions and strategies. Pt was also informed of the importance of increasing exercise and decreasing saturated fats to help prevent heart disease.  Obesity Brandy Love is currently in the action stage of change. As such, her goal is to continue with weight loss efforts She has agreed to follow the Category 3 plan Brandy Love has been instructed to work up to a goal of 150 minutes of combined cardio and strengthening exercise per week for weight loss and overall health benefits. We discussed the following Behavioral Modification Strategies today: work on meal planning and easy cooking plans and better snacking choices   Brandy Love has agreed to follow up with our clinic in 3 to 4 weeks. She was informed of the importance of frequent follow up visits to maximize her success with intensive lifestyle modifications for her multiple health conditions.   OBESITY BEHAVIORAL INTERVENTION VISIT  Today's visit was # 6 out of 22.  Starting weight: 273 lbs Starting date: 10/17/17 Today's weight : 258 lbs Today's date: 01/30/2018 Total lbs lost to date: 15    ASK: We discussed the diagnosis of obesity with Brandy Love today and Brandy Love agreed to give [place] permission to discuss obesity behavioral modification therapy today.  ASSESS: Brandy Love has the diagnosis of obesity and her BMI today is 40.4 Brandy Love is in the action stage of change   ADVISE: Brandy Love was educated on the multiple health risks of obesity as well as  the benefit of weight loss to  improve her health. She was advised of the need for long term treatment and the importance of lifestyle modifications.  AGREE: Multiple dietary modification options and treatment options were discussed and  Brandy Love agreed to the above obesity treatment plan.  I, Brandy Love, am acting as transcriptionist for [name] Nip, MD  I have reviewed the above documentation for accuracy and completeness, and I agree with the above. -[name] Nip, MD

## 2018-02-21 ENCOUNTER — Ambulatory Visit (INDEPENDENT_AMBULATORY_CARE_PROVIDER_SITE_OTHER): Payer: 59 | Admitting: Physician Assistant

## 2018-03-02 ENCOUNTER — Other Ambulatory Visit (INDEPENDENT_AMBULATORY_CARE_PROVIDER_SITE_OTHER): Payer: Self-pay | Admitting: Family Medicine

## 2018-03-02 DIAGNOSIS — I10 Essential (primary) hypertension: Secondary | ICD-10-CM

## 2018-03-02 DIAGNOSIS — E559 Vitamin D deficiency, unspecified: Secondary | ICD-10-CM

## 2018-03-07 ENCOUNTER — Ambulatory Visit (INDEPENDENT_AMBULATORY_CARE_PROVIDER_SITE_OTHER): Payer: 59 | Admitting: Physician Assistant

## 2018-03-07 VITALS — BP 122/80 | HR 85 | Temp 97.6°F | Ht 67.0 in | Wt 256.0 lb

## 2018-03-07 DIAGNOSIS — Z6841 Body Mass Index (BMI) 40.0 and over, adult: Secondary | ICD-10-CM

## 2018-03-07 DIAGNOSIS — R7303 Prediabetes: Secondary | ICD-10-CM

## 2018-03-07 DIAGNOSIS — I10 Essential (primary) hypertension: Secondary | ICD-10-CM

## 2018-03-07 DIAGNOSIS — Z9189 Other specified personal risk factors, not elsewhere classified: Secondary | ICD-10-CM | POA: Diagnosis not present

## 2018-03-07 DIAGNOSIS — E7849 Other hyperlipidemia: Secondary | ICD-10-CM | POA: Diagnosis not present

## 2018-03-07 DIAGNOSIS — E559 Vitamin D deficiency, unspecified: Secondary | ICD-10-CM

## 2018-03-07 MED ORDER — CHLORTHALIDONE 25 MG PO TABS: 25.0000 mg | ORAL_TABLET | Freq: Every day | ORAL | 0 refills | Status: DC

## 2018-03-07 MED ORDER — VITAMIN D (ERGOCALCIFEROL) 1.25 MG (50000 UNIT) PO CAPS: 50000.0000 [IU] | ORAL_CAPSULE | ORAL | 0 refills | Status: DC

## 2018-03-07 NOTE — Progress Notes (Signed)
Office: [phone]  /  Fax: [phone]   HPI:   Chief Complaint: OBESITY Brandy Love is here to discuss her progress with her obesity treatment plan. She is on the Category 3 plan and is following her eating plan approximately 25 % of the time. She states she is exercising 0 minutes 0 times per week. Brandy Love did well with weight loss. She reports struggling with meal planning and stress eating. Brandy Love states that she doe not like vegetables and she is trying to find creative ways to get them in her diet. Her weight is 256 lb (116.1 kg) today and has had a weight loss of 2 pounds over a period of 5 weeks since her last visit. She has lost 17 lbs since starting treatment with [place].  Vitamin D deficiency Brandy Love has a diagnosis of vitamin D deficiency. She is on prescription vit D and denies nausea, vomiting or muscle weakness.  Hypertension Brandy Love is a 62 y.o. female with hypertension. Brandy Love denies chest pain. She is working weight loss to help control her blood pressure with the goal of decreasing her risk of heart attack and stroke. Brandy Love blood pressure is currently controlled.  Hyperlipidemia Brandy Love has hyperlipidemia and she is not on medication. She has been trying to improve her cholesterol levels with intensive lifestyle modification including a low saturated fat diet, exercise and weight loss. She denies any chest pain.  At risk for cardiovascular disease Brandy Love is at a higher than average risk for cardiovascular disease due to obesity, hypertension and hyperlipidemia. She currently denies any chest pain.  Pre-Diabetes Brandy Love has a diagnosis of prediabetes based on her elevated Hgb A1c and was informed this puts her at greater risk of developing diabetes. She is not taking metformin currently and continues to work on diet and exercise to decrease risk of diabetes. She denies polyphagia.  ALLERGIES: Allergies  Allergen Reactions  . Adhesive [Tape]     Rash with tape from first stage  interstim  . Ibuprofen     REACTION: vertigo  . Amlodipine Hives, Swelling and Rash  . Betadine [Povidone Iodine] Rash     ALL TOPICAL IODINES    MEDICATIONS: Current Outpatient Medications on File Prior to Visit  Medication Sig Dispense Refill  . albuterol (PROVENTIL HFA;VENTOLIN HFA) 108 (90 Base) MCG/ACT inhaler Inhale into the lungs every 6 (six) hours as needed for wheezing or shortness of breath.    . beclomethasone (QVAR) 80 MCG/ACT inhaler Inhale 2 puffs into the lungs 2 (two) times daily.    [name] Clocortolone Pivalate (CLODERM) 0.1 % cream Apply 1 application topically 2 (two) times daily.    [name] dexlansoprazole (DEXILANT) 60 MG capsule Take 60 mg by mouth daily.    [name] losartan (COZAAR) 100 MG tablet Take 100 mg by mouth daily.    . Multiple Vitamins-Minerals (MULTIVITAMIN WITH MINERALS) tablet Take 1 tablet by mouth daily.    . sertraline (ZOLOFT) 100 MG tablet Take 100 mg by mouth 2 (two) times daily.    [name] tolterodine (DETROL LA) 4 MG 24 hr capsule Take 4 mg by mouth daily.     No current facility-administered medications on file prior to visit.     PAST MEDICAL HISTORY: Past Medical History:  Diagnosis Date  . Asthma    controlled well  . Bladder prolapse, female, acquired   . Edema extremities    lower legs at times  . Gastric ulcer   . GERD (gastroesophageal reflux disease)   .  Glaucoma   . High blood pressure   . OAB (overactive bladder)   . Prediabetes     PAST SURGICAL HISTORY: Past Surgical History:  Procedure Laterality Date  . ABDOMINAL HYSTERECTOMY  1993  . APPENDECTOMY    . CATARACT EXTRACTION  2016  . CYSTECTOMY     1994 and 1982  . INTERSTIM IMPLANT PLACEMENT  04/10/2012   Procedure: Barrie Lyme IMPLANT FIRST STAGE;  Surgeon: [name], MD;  Location: [facility];  Service: Urology;  Laterality: N/A;  rad tech ok per [facility] at [place]   . INTERSTIM IMPLANT PLACEMENT  04/10/2012   Procedure: Barrie Lyme IMPLANT SECOND STAGE;  Surgeon:  [name], MD;  Location: [facility];  Service: Urology;  Laterality: N/A;  . LOWER LEG SOFT TISSUE TUMOR EXCISION  1994   cyst left ankle -involved muscle removed-limited mobility now  . NASAL SEPTUM SURGERY      SOCIAL HISTORY: Social History   Tobacco Use  . Smoking status: Never Smoker  . Smokeless tobacco: Never Used  Substance Use Topics  . Alcohol use: No  . Drug use: No    FAMILY HISTORY: Family History  Problem Relation Age of Onset  . Asthma Mother   . Diabetes Mother   . Hernia Mother   . Obesity Mother   . Heart disease Father   . Prostate cancer Father     ROS: Review of Systems  Constitutional: Positive for weight loss.  Cardiovascular: Negative for chest pain.  Endo/Heme/Allergies:       Negative for polyphagia    PHYSICAL EXAM: Blood pressure 122/80, pulse 85, temperature 97.6 F (36.4 C), temperature source Oral, height 5\' 7"  (1.702 m), weight 256 lb (116.1 kg), SpO2 99 %. Body mass index is 40.1 kg/m. Physical Exam  Constitutional: She is oriented to person, place, and time. She appears well-developed and well-nourished.  Cardiovascular: Normal rate.  Pulmonary/Chest: Effort normal.  Musculoskeletal: Normal range of motion.  Neurological: She is oriented to person, place, and time.  Skin: Skin is warm and dry.  Psychiatric: She has a normal mood and affect. Her behavior is normal.  Vitals reviewed.   RECENT LABS AND TESTS: BMET    Component Value Date/Time   NA 142 10/17/2017 0938   K 4.2 10/17/2017 0938   CL 102 10/17/2017 0938   CO2 27 10/17/2017 0938   GLUCOSE 127 (H) 10/17/2017 0938   GLUCOSE 114 (H) 04/10/2012 0731   BUN 12 10/17/2017 0938   CREATININE 0.79 10/17/2017 0938   CALCIUM 9.2 10/17/2017 0938   GFRNONAA 81 10/17/2017 0938   GFRAA 93 10/17/2017 0938   Lab Results  Component Value Date   HGBA1C 6.3 (H) 10/17/2017   Lab Results  Component Value Date   INSULIN 25.3 (H) 10/17/2017   CBC     Component Value Date/Time   WBC 5.8 10/17/2017 0938   WBC 6.8 09/07/2010 0356   RBC 4.53 10/17/2017 0938   RBC 4.90 09/07/2010 0356   HGB 12.2 10/17/2017 0938   HCT 37.5 10/17/2017 0938   PLT 235 09/07/2010 0356   MCV 83 10/17/2017 0938   MCH 26.9 10/17/2017 0938   MCH 26.3 09/07/2010 0356   MCHC 32.5 10/17/2017 0938   MCHC 33.0 09/07/2010 0356   RDW 15.7 (H) 10/17/2017 0938   LYMPHSABS 2.2 10/17/2017 0938   MONOABS 0.5 09/07/2010 0356   EOSABS 0.3 10/17/2017 0938   BASOSABS 0.0 10/17/2017 0938   Iron/TIBC/Ferritin/ %Sat No results  found for: IRON, TIBC, FERRITIN, IRONPCTSAT Lipid Panel     Component Value Date/Time   CHOL 186 10/17/2017 0938   TRIG 134 10/17/2017 0938   HDL 40 10/17/2017 0938   LDLCALC 119 (H) 10/17/2017 0938   Hepatic Function Panel     Component Value Date/Time   PROT 7.1 10/17/2017 0938   ALBUMIN 4.0 10/17/2017 0938   AST 27 10/17/2017 0938   ALT 35 (H) 10/17/2017 0938   ALKPHOS 88 10/17/2017 0938   BILITOT 0.3 10/17/2017 0938      Component Value Date/Time   TSH 2.920 10/17/2017 0938   Results for YARIXA, LIGHTCAP (MRN [id number]) as of 03/07/2018 16:00  Ref. Range 10/17/2017 09:38  Vitamin D, 25-Hydroxy Latest Ref Range: 30.0 - 100.0 ng/mL 22.4 (L)   ASSESSMENT AND PLAN: Vitamin D deficiency - Plan: VITAMIN D 25 Hydroxy (Vit-D Deficiency, Fractures), Vitamin D, Ergocalciferol, (DRISDOL) 50000 units CAPS capsule  Essential hypertension - Plan: chlorthalidone (HYGROTON) 25 MG tablet  Other hyperlipidemia - Plan: Lipid Panel With LDL/HDL Ratio  Prediabetes - Plan: Comprehensive metabolic panel, Hemoglobin A1c, Insulin, random  At risk for heart disease  Class 3 severe obesity with serious comorbidity and body mass index (BMI) of 40.0 to 44.9 in adult, unspecified obesity type (HCC)  PLAN:  Vitamin D Deficiency Lailie was informed that low vitamin D levels contributes to fatigue and are associated with obesity, breast, and colon cancer. She  agrees to continue to take prescription Vit D @50 ,000 IU every week #4 with no refills and will follow up for routine testing of vitamin D, at least 2-3 times per year. She was informed of the risk of over-replacement of vitamin D and agrees to not increase her dose unless she discusses this with [place] first. Brandy Love agrees to follow up as directed.  Hypertension We discussed sodium restriction, working on healthy weight loss, and a regular exercise program as the means to achieve improved blood pressure control. Brandy Love agreed with this plan and agreed to follow up as directed. We will continue to monitor her blood pressure as well as her progress with the above lifestyle modifications. She agrees to continue chlorthalidone 25 mg qd #30 with no refills and will watch for signs of hypotension as she continues her lifestyle modifications.  Hyperlipidemia Brandy Love was informed of the American Heart Association Guidelines emphasizing intensive lifestyle modifications as the first line treatment for hyperlipidemia. We discussed many lifestyle modifications today in depth, and Brandy Love will continue to work on decreasing saturated fats such as fatty red meat, butter and many fried foods. She will also increase vegetables and lean protein in her diet and continue to work on exercise and weight loss efforts. We will check labs and Brandy Love will follow up as directed.  Cardiovascular risk counseling Brandy Love was given extended (15 minutes) coronary artery disease prevention counseling today. She is 61 y.o. female and has risk factors for heart disease including obesity, hypertension and hyperlipidemia. We discussed intensive lifestyle modifications today with an emphasis on specific weight loss instructions and strategies. Pt was also informed of the importance of increasing exercise and decreasing saturated fats to help prevent heart disease.  Pre-Diabetes Brandy Love will continue to work on weight loss, exercise, and decreasing simple  carbohydrates in her diet to help decrease the risk of diabetes. She was informed that eating too many simple carbohydrates or too many calories at one sitting increases the likelihood of GI side effects. We will check labs and Brandy Love agreed to follow up  with [place] as directed to monitor her progress.  Obesity Brandy Love is currently in the action stage of change. As such, her goal is to continue with weight loss efforts She has agreed to follow the Category 3 plan Brandy Love has been instructed to work up to a goal of 150 minutes of combined cardio and strengthening exercise per week for weight loss and overall health benefits. We discussed the following Behavioral Modification Strategies today: work on meal planning and easy cooking plans and ways to avoid boredom eating  Brandy Love has agreed to follow up with our clinic in 2 to 3 weeks. She was informed of the importance of frequent follow up visits to maximize her success with intensive lifestyle modifications for her multiple health conditions.   OBESITY BEHAVIORAL INTERVENTION VISIT  Today's visit was # 7   Starting weight: 273 lbs Starting date: 10/17/17 Today's weight : 256 lbs Today's date: 03/07/2018 Total lbs lost to date: 70   ASK: We discussed the diagnosis of obesity with Brandy Love today and Brandy Love agreed to give [place] permission to discuss obesity behavioral modification therapy today.  ASSESS: Brandy Love has the diagnosis of obesity and her BMI today is 40.09 Sayge is in the action stage of change   ADVISE: Kyllie was educated on the multiple health risks of obesity as well as the benefit of weight loss to improve her health. She was advised of the need for long term treatment and the importance of lifestyle modifications to improve her current health and to decrease her risk of future health problems.  AGREE: Multiple dietary modification options and treatment options were discussed and  Athaliah agreed to follow the recommendations documented in the  above note.  ARRANGE: Amonda was educated on the importance of frequent visits to treat obesity as outlined per CMS and USPSTF guidelines and agreed to schedule her next follow up appointment today.  Corey Skains, am acting as transcriptionist for Abby Potash, PA-C I, Abby Potash, PA-C have reviewed above note and agree with its content

## 2018-03-08 LAB — COMPREHENSIVE METABOLIC PANEL
A/G RATIO: 1.4 (ref 1.2–2.2)
ALT: 33 IU/L — ABNORMAL HIGH (ref 0–32)
AST: 26 IU/L (ref 0–40)
Albumin: 4.2 g/dL (ref 3.6–4.8)
Alkaline Phosphatase: 76 IU/L (ref 39–117)
BUN/Creatinine Ratio: 16 (ref 12–28)
BUN: 14 mg/dL (ref 8–27)
Bilirubin Total: 0.3 mg/dL (ref 0.0–1.2)
CALCIUM: 9.6 mg/dL (ref 8.7–10.3)
CO2: 29 mmol/L (ref 20–29)
CREATININE: 0.86 mg/dL (ref 0.57–1.00)
Chloride: 99 mmol/L (ref 96–106)
GFR, EST AFRICAN AMERICAN: 84 mL/min/{1.73_m2} (ref >59)
GFR, EST NON AFRICAN AMERICAN: 73 mL/min/{1.73_m2} (ref >59)
Globulin, Total: 3.1 g/dL (ref 1.5–4.5)
Glucose: 130 mg/dL — ABNORMAL HIGH (ref 65–99)
POTASSIUM: 4 mmol/L (ref 3.5–5.2)
Sodium: 143 mmol/L (ref 134–144)
TOTAL PROTEIN: 7.3 g/dL (ref 6.0–8.5)

## 2018-03-08 LAB — HEMOGLOBIN A1C
Est. average glucose Bld gHb Est-mCnc: 137 mg/dL
Hgb A1c MFr Bld: 6.4 % — ABNORMAL HIGH (ref 4.8–5.6)

## 2018-03-08 LAB — LIPID PANEL WITH LDL/HDL RATIO
CHOLESTEROL TOTAL: 195 mg/dL (ref 100–199)
HDL: 41 mg/dL (ref >39)
LDL Calculated: 124 mg/dL — ABNORMAL HIGH (ref 0–99)
LDl/HDL Ratio: 3 ratio (ref 0.0–3.2)
TRIGLYCERIDES: 150 mg/dL — AB (ref 0–149)
VLDL CHOLESTEROL CAL: 30 mg/dL (ref 5–40)

## 2018-03-08 LAB — VITAMIN D 25 HYDROXY (VIT D DEFICIENCY, FRACTURES): Vit D, 25-Hydroxy: 34 ng/mL (ref 30.0–100.0)

## 2018-03-08 LAB — INSULIN, RANDOM: INSULIN: 35.1 u[IU]/mL — AB (ref 2.6–24.9)

## 2018-03-27 ENCOUNTER — Encounter (INDEPENDENT_AMBULATORY_CARE_PROVIDER_SITE_OTHER): Payer: Self-pay | Admitting: Physician Assistant

## 2018-03-28 NOTE — Telephone Encounter (Signed)
Please address

## 2018-03-29 ENCOUNTER — Ambulatory Visit (INDEPENDENT_AMBULATORY_CARE_PROVIDER_SITE_OTHER): Payer: Self-pay | Admitting: Physician Assistant

## 2018-04-04 ENCOUNTER — Other Ambulatory Visit (INDEPENDENT_AMBULATORY_CARE_PROVIDER_SITE_OTHER): Payer: Self-pay | Admitting: Physician Assistant

## 2018-04-04 DIAGNOSIS — I10 Essential (primary) hypertension: Secondary | ICD-10-CM

## 2018-04-04 DIAGNOSIS — E559 Vitamin D deficiency, unspecified: Secondary | ICD-10-CM

## 2018-04-12 ENCOUNTER — Encounter (INDEPENDENT_AMBULATORY_CARE_PROVIDER_SITE_OTHER): Payer: Self-pay | Admitting: Physician Assistant

## 2018-04-12 ENCOUNTER — Ambulatory Visit (INDEPENDENT_AMBULATORY_CARE_PROVIDER_SITE_OTHER): Payer: 59 | Admitting: Physician Assistant

## 2018-04-12 VITALS — BP 116/74 | HR 73 | Temp 98.2°F | Ht 67.0 in | Wt 256.0 lb

## 2018-04-12 DIAGNOSIS — E559 Vitamin D deficiency, unspecified: Secondary | ICD-10-CM | POA: Diagnosis not present

## 2018-04-12 DIAGNOSIS — Z6841 Body Mass Index (BMI) 40.0 and over, adult: Secondary | ICD-10-CM | POA: Diagnosis not present

## 2018-04-12 NOTE — Progress Notes (Signed)
Office: [phone]  /  Fax: [phone]   HPI:   Chief Complaint: OBESITY Brandy Love is here to discuss her progress with her obesity treatment plan. Brandy Love is on the Category 3 plan and is following her eating plan approximately 25 % of the time. Brandy Love states Brandy Love is exercising 0 minutes 0 times per week. Brandy Love struggled to follow Category 3 plan due to a death in her family. Brandy Love reports practicing smart choices and portion control.  Her weight is 256 lb (116.1 kg) today and has not lost weight since her last visit. Brandy Love has lost 17 lbs since starting treatment with [place].  Vitamin D Deficiency Brandy Love has a diagnosis of vitamin D deficiency. Brandy Love is currently taking prescription Vit D and denies nausea, vomiting or muscle weakness.  ALLERGIES: Allergies  Allergen Reactions  . Adhesive [Tape]     Rash with tape from first stage interstim  . Ibuprofen     REACTION: vertigo  . Amlodipine Hives, Swelling and Rash  . Betadine [Povidone Iodine] Rash     ALL TOPICAL IODINES    MEDICATIONS: Current Outpatient Medications on File Prior to Visit  Medication Sig Dispense Refill  . albuterol (PROVENTIL HFA;VENTOLIN HFA) 108 (90 Base) MCG/ACT inhaler Inhale into the lungs every 6 (six) hours as needed for wheezing or shortness of breath.    . beclomethasone (QVAR) 80 MCG/ACT inhaler Inhale 2 puffs into the lungs 2 (two) times daily.    . chlorthalidone (HYGROTON) 25 MG tablet TAKE 1 TABLET (25 MG TOTAL) BY MOUTH DAILY. 30 tablet 0  . Clocortolone Pivalate (CLODERM) 0.1 % cream Apply 1 application topically 2 (two) times daily.    [name] dexlansoprazole (DEXILANT) 60 MG capsule Take 60 mg by mouth daily.    [name] losartan (COZAAR) 100 MG tablet Take 100 mg by mouth daily.    . Multiple Vitamins-Minerals (MULTIVITAMIN WITH MINERALS) tablet Take 1 tablet by mouth daily.    . sertraline (ZOLOFT) 100 MG tablet Take 100 mg by mouth 2 (two) times daily.    [name] tolterodine (DETROL LA) 4 MG 24 hr capsule Take 4 mg by mouth  daily.    . Vitamin D, Ergocalciferol, (DRISDOL) 50000 units CAPS capsule TAKE 1 CAPSULE (50,000 UNITS TOTAL) BY MOUTH EVERY 7 (SEVEN) DAYS. 4 capsule 0   No current facility-administered medications on file prior to visit.     PAST MEDICAL HISTORY: Past Medical History:  Diagnosis Date  . Asthma    controlled well  . Bladder prolapse, female, acquired   . Edema extremities    lower legs at times  . Gastric ulcer   . GERD (gastroesophageal reflux disease)   . Glaucoma   . High blood pressure   . OAB (overactive bladder)   . Prediabetes     PAST SURGICAL HISTORY: Past Surgical History:  Procedure Laterality Date  . ABDOMINAL HYSTERECTOMY  1993  . APPENDECTOMY    . CATARACT EXTRACTION  2016  . CYSTECTOMY     1994 and 1982  . INTERSTIM IMPLANT PLACEMENT  04/10/2012   Procedure: Barrie Lyme IMPLANT FIRST STAGE;  Surgeon: [name], MD;  Location: [facility];  Service: Urology;  Laterality: N/A;  rad tech ok per vicki at [place]   . INTERSTIM IMPLANT PLACEMENT  04/10/2012   Procedure: Barrie Lyme IMPLANT SECOND STAGE;  Surgeon: [name], MD;  Location: [facility];  Service: Urology;  Laterality: N/A;  . LOWER LEG SOFT TISSUE TUMOR EXCISION  1994  cyst left ankle -involved muscle removed-limited mobility now  . NASAL SEPTUM SURGERY      SOCIAL HISTORY: Social History   Tobacco Use  . Smoking status: Never Smoker  . Smokeless tobacco: Never Used  Substance Use Topics  . Alcohol use: No  . Drug use: No    FAMILY HISTORY: Family History  Problem Relation Age of Onset  . Asthma Mother   . Diabetes Mother   . Hernia Mother   . Obesity Mother   . Heart disease Father   . Prostate cancer Father     ROS: Review of Systems  Constitutional: Negative for weight loss.  Gastrointestinal: Negative for nausea and vomiting.  Musculoskeletal:       Negative muscle weakness    PHYSICAL EXAM: Blood pressure 116/74, pulse 73,  temperature 98.2 F (36.8 C), temperature source Oral, height 5\' 7"  (1.702 m), weight 256 lb (116.1 kg), SpO2 97 %. Body mass index is 40.1 kg/m. Physical Exam  Constitutional: Brandy Love is oriented to person, place, and time. Brandy Love appears well-developed and well-nourished.  Cardiovascular: Normal rate.  Pulmonary/Chest: Effort normal.  Musculoskeletal: Normal range of motion.  Neurological: Brandy Love is oriented to person, place, and time.  Skin: Skin is warm and dry.  Psychiatric: Brandy Love has a normal mood and affect. Her behavior is normal.  Vitals reviewed.   RECENT LABS AND TESTS: BMET    Component Value Date/Time   NA 143 03/07/2018 1009   K 4.0 03/07/2018 1009   CL 99 03/07/2018 1009   CO2 29 03/07/2018 1009   GLUCOSE 130 (H) 03/07/2018 1009   GLUCOSE 114 (H) 04/10/2012 0731   BUN 14 03/07/2018 1009   CREATININE 0.86 03/07/2018 1009   CALCIUM 9.6 03/07/2018 1009   GFRNONAA 73 03/07/2018 1009   GFRAA 84 03/07/2018 1009   Lab Results  Component Value Date   HGBA1C 6.4 (H) 03/07/2018   HGBA1C 6.3 (H) 10/17/2017   Lab Results  Component Value Date   INSULIN 35.1 (H) 03/07/2018   INSULIN 25.3 (H) 10/17/2017   CBC    Component Value Date/Time   WBC 5.8 10/17/2017 0938   WBC 6.8 09/07/2010 0356   RBC 4.53 10/17/2017 0938   RBC 4.90 09/07/2010 0356   HGB 12.2 10/17/2017 0938   HCT 37.5 10/17/2017 0938   PLT 235 09/07/2010 0356   MCV 83 10/17/2017 0938   MCH 26.9 10/17/2017 0938   MCH 26.3 09/07/2010 0356   MCHC 32.5 10/17/2017 0938   MCHC 33.0 09/07/2010 0356   RDW 15.7 (H) 10/17/2017 0938   LYMPHSABS 2.2 10/17/2017 0938   MONOABS 0.5 09/07/2010 0356   EOSABS 0.3 10/17/2017 0938   BASOSABS 0.0 10/17/2017 0938   Iron/TIBC/Ferritin/ %Sat No results found for: IRON, TIBC, FERRITIN, IRONPCTSAT Lipid Panel     Component Value Date/Time   CHOL 195 03/07/2018 1009   TRIG 150 (H) 03/07/2018 1009   HDL 41 03/07/2018 1009   LDLCALC 124 (H) 03/07/2018 1009   Hepatic  Function Panel     Component Value Date/Time   PROT 7.3 03/07/2018 1009   ALBUMIN 4.2 03/07/2018 1009   AST 26 03/07/2018 1009   ALT 33 (H) 03/07/2018 1009   ALKPHOS 76 03/07/2018 1009   BILITOT 0.3 03/07/2018 1009      Component Value Date/Time   TSH 2.920 10/17/2017 0938  Results for BERENIS, CORTER (MRN [id number]) as of 04/12/2018 16:42  Ref. Range 03/07/2018 10:09  Vitamin D, 25-Hydroxy Latest Ref Range: 30.0 -  100.0 ng/mL 34.0    ASSESSMENT AND PLAN: Vitamin D deficiency  Class 3 severe obesity with serious comorbidity and body mass index (BMI) of 40.0 to 44.9 in adult, unspecified obesity type ([place])  PLAN:  Vitamin D Deficiency Brandy Love was informed that low vitamin D levels contributes to fatigue and are associated with obesity, breast, and colon cancer. Brandy Love agrees to continue taking prescription Vit D @50 ,000 IU every week and will follow up for routine testing of vitamin D, at least 2-3 times per year. Brandy Love was informed of the risk of over-replacement of vitamin D and agrees to not increase her dose unless Brandy Love discusses this with [place] first. Brandy Love agrees to follow up with our clinic in 2 weeks.  I spent > than 50% of the 15 minute visit on counseling as documented in the note.  Obesity Brandy Love is currently in the action stage of change. As such, her goal is to continue with weight loss efforts Brandy Love has agreed to portion control better and make smarter food choices, such as increase vegetables and decrease simple carbohydrates  Brandy Love has been instructed to work up to a goal of 150 minutes of combined cardio and strengthening exercise per week for weight loss and overall health benefits. We discussed the following Behavioral Modification Strategies today: work on meal planning and easy cooking plans and no skipping meals   Brandy Love has agreed to follow up with our clinic in 2 weeks. Brandy Love was informed of the importance of frequent follow up visits to maximize her success with intensive  lifestyle modifications for her multiple health conditions.   OBESITY BEHAVIORAL INTERVENTION VISIT  Today's visit was # 8   Starting weight: 273 lbs Starting date: 10/17/17 Today's weight : 256 lbs Today's date: 04/12/2018 Total lbs lost to date: 63    ASK: We discussed the diagnosis of obesity with Brandy Love today and Brandy Love agreed to give [place] permission to discuss obesity behavioral modification therapy today.  ASSESS: Brandy Love has the diagnosis of obesity and her BMI today is 40.09 Brandy Love is in the action stage of change   ADVISE: Brandy Love was educated on the multiple health risks of obesity as well as the benefit of weight loss to improve her health. Brandy Love was advised of the need for long term treatment and the importance of lifestyle modifications.  AGREE: Multiple dietary modification options and treatment options were discussed and  Brandy Love agreed to the above obesity treatment plan.  Brandy Love, am acting as transcriptionist for Abby Potash, PA-C I, Abby Potash, PA-C have reviewed above note and agree with its content

## 2018-04-17 DIAGNOSIS — I10 Essential (primary) hypertension: Secondary | ICD-10-CM | POA: Diagnosis not present

## 2018-04-17 DIAGNOSIS — R7303 Prediabetes: Secondary | ICD-10-CM | POA: Diagnosis not present

## 2018-04-17 DIAGNOSIS — J4530 Mild persistent asthma, uncomplicated: Secondary | ICD-10-CM | POA: Diagnosis not present

## 2018-04-17 DIAGNOSIS — Z23 Encounter for immunization: Secondary | ICD-10-CM | POA: Diagnosis not present

## 2018-04-26 ENCOUNTER — Ambulatory Visit (INDEPENDENT_AMBULATORY_CARE_PROVIDER_SITE_OTHER): Payer: Self-pay | Admitting: Physician Assistant

## 2018-05-04 ENCOUNTER — Other Ambulatory Visit (INDEPENDENT_AMBULATORY_CARE_PROVIDER_SITE_OTHER): Payer: Self-pay | Admitting: Physician Assistant

## 2018-05-04 DIAGNOSIS — E559 Vitamin D deficiency, unspecified: Secondary | ICD-10-CM

## 2018-05-04 DIAGNOSIS — I10 Essential (primary) hypertension: Secondary | ICD-10-CM

## 2018-05-14 ENCOUNTER — Ambulatory Visit (INDEPENDENT_AMBULATORY_CARE_PROVIDER_SITE_OTHER): Payer: Self-pay | Admitting: Physician Assistant

## 2018-05-14 ENCOUNTER — Encounter (INDEPENDENT_AMBULATORY_CARE_PROVIDER_SITE_OTHER): Payer: Self-pay

## 2018-05-23 DIAGNOSIS — L308 Other specified dermatitis: Secondary | ICD-10-CM | POA: Diagnosis not present

## 2018-05-23 DIAGNOSIS — L7211 Pilar cyst: Secondary | ICD-10-CM | POA: Diagnosis not present

## 2018-05-24 ENCOUNTER — Ambulatory Visit (INDEPENDENT_AMBULATORY_CARE_PROVIDER_SITE_OTHER): Payer: 59 | Admitting: Physician Assistant

## 2018-05-24 ENCOUNTER — Encounter (INDEPENDENT_AMBULATORY_CARE_PROVIDER_SITE_OTHER): Payer: Self-pay | Admitting: Physician Assistant

## 2018-05-24 VITALS — BP 126/80 | HR 95 | Temp 97.8°F | Ht 67.0 in | Wt 257.0 lb

## 2018-05-24 DIAGNOSIS — Z6841 Body Mass Index (BMI) 40.0 and over, adult: Secondary | ICD-10-CM

## 2018-05-24 DIAGNOSIS — E559 Vitamin D deficiency, unspecified: Secondary | ICD-10-CM

## 2018-05-24 DIAGNOSIS — E7849 Other hyperlipidemia: Secondary | ICD-10-CM | POA: Diagnosis not present

## 2018-05-24 DIAGNOSIS — Z9189 Other specified personal risk factors, not elsewhere classified: Secondary | ICD-10-CM

## 2018-05-24 MED ORDER — VITAMIN D (ERGOCALCIFEROL) 1.25 MG (50000 UNIT) PO CAPS: ORAL_CAPSULE | ORAL | 0 refills | Status: DC

## 2018-05-28 NOTE — Progress Notes (Signed)
Office: [phone]  /  Fax: [phone]   HPI:   Chief Complaint: OBESITY Brandy Love is here to discuss her progress with her obesity treatment plan. She is on the portion control better and make smarter food choices, such as increase vegetables and decrease simple carbohydrates and is following her eating plan approximately 10 % of the time. She states she is exercising 0 minutes 0 times per week. Brandy Love reports that she has been attempting to portion control her food, but she likes the Category plan better. She is traveling for work next week and is interested in travel eating strategies.  Her weight is 257 lb (116.6 kg) today and has gained 1 pound since her last visit. She has lost 16 lbs since starting treatment with [place].  Hyperlipidemia Brandy Love has hyperlipidemia and has been trying to improve her cholesterol levels with intensive lifestyle modification including a low saturated fat diet, exercise and weight loss. She is not on medications and denies any chest pain, claudication or myalgias.  At risk for cardiovascular disease Brandy Love is at a higher than average risk for cardiovascular disease due to obesity and hyperlipidemia. She currently denies any chest pain.  Vitamin D Deficiency Brandy Love has a diagnosis of vitamin D deficiency. She is currently taking prescription Vit D and denies nausea, vomiting or muscle weakness.  ALLERGIES: Allergies  Allergen Reactions  . Adhesive [Tape]     Rash with tape from first stage interstim  . Ibuprofen     REACTION: vertigo  . Amlodipine Hives, Swelling and Rash  . Betadine [Povidone Iodine] Rash     ALL TOPICAL IODINES    MEDICATIONS: Current Outpatient Medications on File Prior to Visit  Medication Sig Dispense Refill  . albuterol (PROVENTIL HFA;VENTOLIN HFA) 108 (90 Base) MCG/ACT inhaler Inhale into the lungs every 6 (six) hours as needed for wheezing or shortness of breath.    . beclomethasone (QVAR) 80 MCG/ACT inhaler Inhale 2 puffs into the  lungs 2 (two) times daily.    . chlorthalidone (HYGROTON) 25 MG tablet TAKE 1 TABLET BY MOUTH DAILY. 30 tablet 0  . Clocortolone Pivalate (CLODERM) 0.1 % cream Apply 1 application topically 2 (two) times daily.    [name] dexlansoprazole (DEXILANT) 60 MG capsule Take 60 mg by mouth daily.    [name] losartan (COZAAR) 100 MG tablet Take 100 mg by mouth daily.    . Multiple Vitamins-Minerals (MULTIVITAMIN WITH MINERALS) tablet Take 1 tablet by mouth daily.    . sertraline (ZOLOFT) 100 MG tablet Take 100 mg by mouth 2 (two) times daily.    [name] tolterodine (DETROL LA) 4 MG 24 hr capsule Take 4 mg by mouth daily.     No current facility-administered medications on file prior to visit.     PAST MEDICAL HISTORY: Past Medical History:  Diagnosis Date  . Asthma    controlled well  . Bladder prolapse, female, acquired   . Edema extremities    lower legs at times  . Gastric ulcer   . GERD (gastroesophageal reflux disease)   . Glaucoma   . High blood pressure   . OAB (overactive bladder)   . Prediabetes     PAST SURGICAL HISTORY: Past Surgical History:  Procedure Laterality Date  . ABDOMINAL HYSTERECTOMY  1993  . APPENDECTOMY    . CATARACT EXTRACTION  2016  . CYSTECTOMY     1994 and 1982  . INTERSTIM IMPLANT PLACEMENT  04/10/2012   Procedure: Barrie Lyme IMPLANT FIRST STAGE;  Surgeon: [name],  MD;  Location: [place];  Service: Urology;  Laterality: N/A;  rad tech ok per vicki at [place]   . INTERSTIM IMPLANT PLACEMENT  04/10/2012   Procedure: Barrie Lyme IMPLANT SECOND STAGE;  Surgeon: [name], MD;  Location: [facility];  Service: Urology;  Laterality: N/A;  . LOWER LEG SOFT TISSUE TUMOR EXCISION  1994   cyst left ankle -involved muscle removed-limited mobility now  . NASAL SEPTUM SURGERY      SOCIAL HISTORY: Social History   Tobacco Use  . Smoking status: Never Smoker  . Smokeless tobacco: Never Used  Substance Use Topics  . Alcohol use: No    . Drug use: No    FAMILY HISTORY: Family History  Problem Relation Age of Onset  . Asthma Mother   . Diabetes Mother   . Hernia Mother   . Obesity Mother   . Heart disease Father   . Prostate cancer Father     ROS: Review of Systems  Constitutional: Negative for weight loss.  Cardiovascular: Negative for chest pain and claudication.  Gastrointestinal: Negative for nausea and vomiting.  Musculoskeletal: Negative for myalgias.       Negative muscle weakness    PHYSICAL EXAM: Blood pressure 126/80, pulse 95, temperature 97.8 F (36.6 C), temperature source Oral, height 5\' 7"  (1.702 m), weight 257 lb (116.6 kg), SpO2 99 %. Body mass index is 40.25 kg/m. Physical Exam  Constitutional: She is oriented to person, place, and time. She appears well-developed and well-nourished.  Cardiovascular: Normal rate.  Pulmonary/Chest: Effort normal.  Musculoskeletal: Normal range of motion.  Neurological: She is oriented to person, place, and time.  Skin: Skin is warm and dry.  Psychiatric: She has a normal mood and affect. Her behavior is normal.  Vitals reviewed.   RECENT LABS AND TESTS: BMET    Component Value Date/Time   NA 143 03/07/2018 1009   K 4.0 03/07/2018 1009   CL 99 03/07/2018 1009   CO2 29 03/07/2018 1009   GLUCOSE 130 (H) 03/07/2018 1009   GLUCOSE 114 (H) 04/10/2012 0731   BUN 14 03/07/2018 1009   CREATININE 0.86 03/07/2018 1009   CALCIUM 9.6 03/07/2018 1009   GFRNONAA 73 03/07/2018 1009   GFRAA 84 03/07/2018 1009   Lab Results  Component Value Date   HGBA1C 6.4 (H) 03/07/2018   HGBA1C 6.3 (H) 10/17/2017   Lab Results  Component Value Date   INSULIN 35.1 (H) 03/07/2018   INSULIN 25.3 (H) 10/17/2017   CBC    Component Value Date/Time   WBC 5.8 10/17/2017 0938   WBC 6.8 09/07/2010 0356   RBC 4.53 10/17/2017 0938   RBC 4.90 09/07/2010 0356   HGB 12.2 10/17/2017 0938   HCT 37.5 10/17/2017 0938   PLT 235 09/07/2010 0356   MCV 83 10/17/2017 0938    MCH 26.9 10/17/2017 0938   MCH 26.3 09/07/2010 0356   MCHC 32.5 10/17/2017 0938   MCHC 33.0 09/07/2010 0356   RDW 15.7 (H) 10/17/2017 0938   LYMPHSABS 2.2 10/17/2017 0938   MONOABS 0.5 09/07/2010 0356   EOSABS 0.3 10/17/2017 0938   BASOSABS 0.0 10/17/2017 0938   Iron/TIBC/Ferritin/ %Sat No results found for: IRON, TIBC, FERRITIN, IRONPCTSAT Lipid Panel     Component Value Date/Time   CHOL 195 03/07/2018 1009   TRIG 150 (H) 03/07/2018 1009   HDL 41 03/07/2018 1009   LDLCALC 124 (H) 03/07/2018 1009   Hepatic Function Panel     Component Value  Date/Time   PROT 7.3 03/07/2018 1009   ALBUMIN 4.2 03/07/2018 1009   AST 26 03/07/2018 1009   ALT 33 (H) 03/07/2018 1009   ALKPHOS 76 03/07/2018 1009   BILITOT 0.3 03/07/2018 1009      Component Value Date/Time   TSH 2.920 10/17/2017 0938  Results for ANNASTASIA, HASKINS (MRN [id number]) as of 05/28/2018 11:03  Ref. Range 03/07/2018 10:09  Vitamin D, 25-Hydroxy Latest Ref Range: 30.0 - 100.0 ng/mL 34.0    ASSESSMENT AND PLAN: Other hyperlipidemia  Vitamin D deficiency - Plan: Vitamin D, Ergocalciferol, (DRISDOL) 1.25 MG (50000 UT) CAPS capsule  At risk for heart disease  Class 3 severe obesity with serious comorbidity and body mass index (BMI) of 40.0 to 44.9 in adult, unspecified obesity type ([facility])  PLAN:  Hyperlipidemia Brandy Love was informed of the American Heart Association Guidelines emphasizing intensive lifestyle modifications as the first line treatment for hyperlipidemia. We discussed many lifestyle modifications today in depth, and Brandy Love will continue to work on decreasing saturated fats such as fatty red meat, butter and many fried foods. She will also increase vegetables and lean protein in her diet and continue to work on diet, exercise, and weight loss efforts. Brandy Love agrees to follow up with our clinic in 3 weeks.  Cardiovascular risk counselling Brandy Love was given extended (15 minutes) coronary artery disease prevention counseling  today. She is 62 y.o. female and has risk factors for heart disease including obesity and hyperlipidemia. We discussed intensive lifestyle modifications today with an emphasis on specific weight loss instructions and strategies. Pt was also informed of the importance of increasing exercise and decreasing saturated fats to help prevent heart disease.  Vitamin D Deficiency Brandy Love was informed that low vitamin D levels contributes to fatigue and are associated with obesity, breast, and colon cancer. Brandy Love agrees to continue taking prescription Vit D @50 ,000 IU every week #4 and we will refill for 1 month. She will follow up for routine testing of vitamin D, at least 2-3 times per year. She was informed of the risk of over-replacement of vitamin D and agrees to not increase her dose unless she discusses this with [place] first. We will recheck labs at next visit. Brandy Love agrees to follow up with our clinic in 3 weeks.  Obesity Brandy Love is currently in the action stage of change. As such, her goal is to continue with weight loss efforts She has agreed to follow the Category 3 plan Brandy Love has been instructed to work up to a goal of 150 minutes of combined cardio and strengthening exercise per week for weight loss and overall health benefits. We discussed the following Behavioral Modification Strategies today: work on meal planning and easy cooking plans and travel eating strategies    Brandy Love has agreed to follow up with our clinic in 3 weeks. She was informed of the importance of frequent follow up visits to maximize her success with intensive lifestyle modifications for her multiple health conditions.   OBESITY BEHAVIORAL INTERVENTION VISIT  Today's visit was # 9   Starting weight: 273 lbs Starting date: 10/17/17 Today's weight : 257 lbs Today's date: 05/24/2018 Total lbs lost to date: 65    ASK: We discussed the diagnosis of obesity with Brandy Love today and Brandy Love agreed to give [place] permission to discuss  obesity behavioral modification therapy today.  ASSESS: Brandy Love has the diagnosis of obesity and her BMI today is 40.24 Brandy Love is in the action stage of change   ADVISE: Brandy Love was  educated on the multiple health risks of obesity as well as the benefit of weight loss to improve her health. She was advised of the need for long term treatment and the importance of lifestyle modifications.  AGREE: Multiple dietary modification options and treatment options were discussed and  Brandy Love agreed to the above obesity treatment plan.  Wilhemena Durie, am acting as transcriptionist for Abby Potash, PA-C I, Abby Potash, PA-C have reviewed above note and agree with its content

## 2018-06-04 IMAGING — US US ABDOMEN LIMITED
1 series · 14 of 25 positions shown · non-contrast
Comparison: None.

CLINICAL DATA: Right upper quadrant pain

EXAM:
ULTRASOUND ABDOMEN LIMITED RIGHT UPPER QUADRANT

[Series 1: us abdomen limited · 0.23mm/px · 14 of 43 slices shown]
[im 1/43]
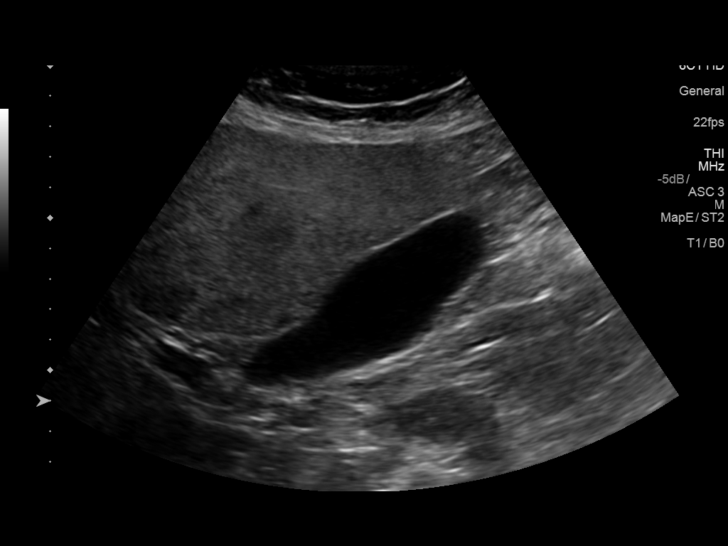
[im 4/43]
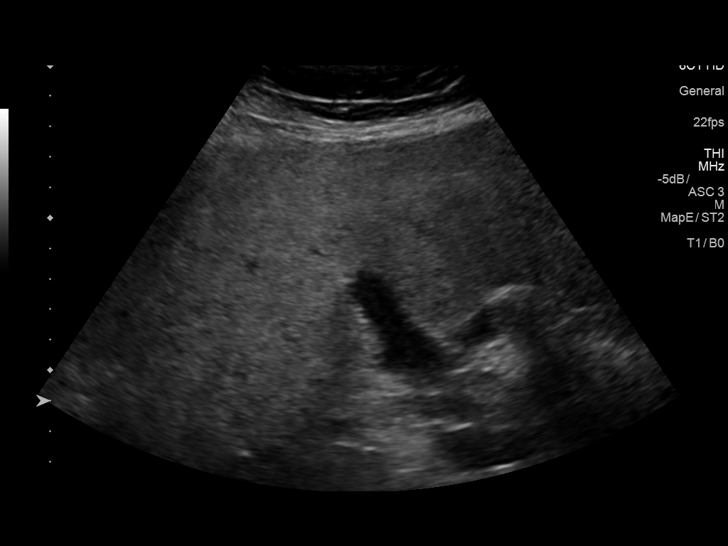
[im 8/43]
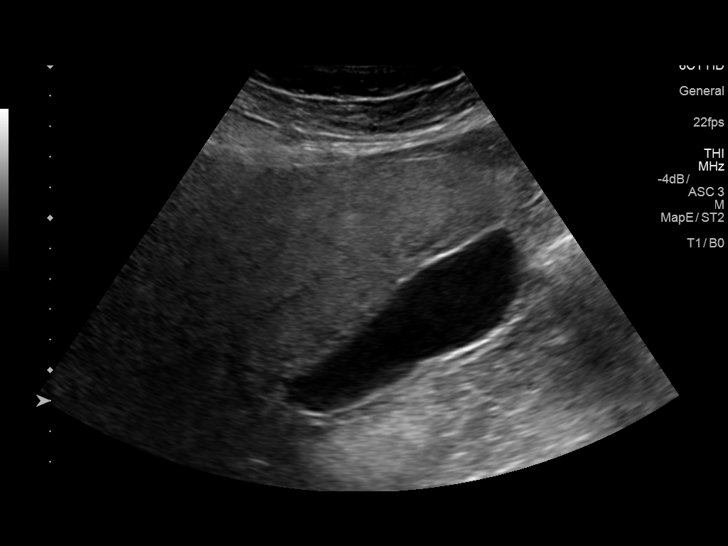
[im 11/43]
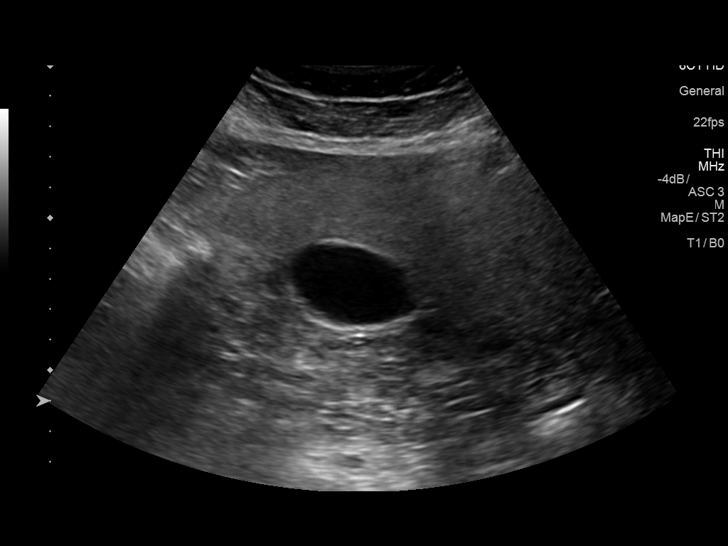
[im 15/43]
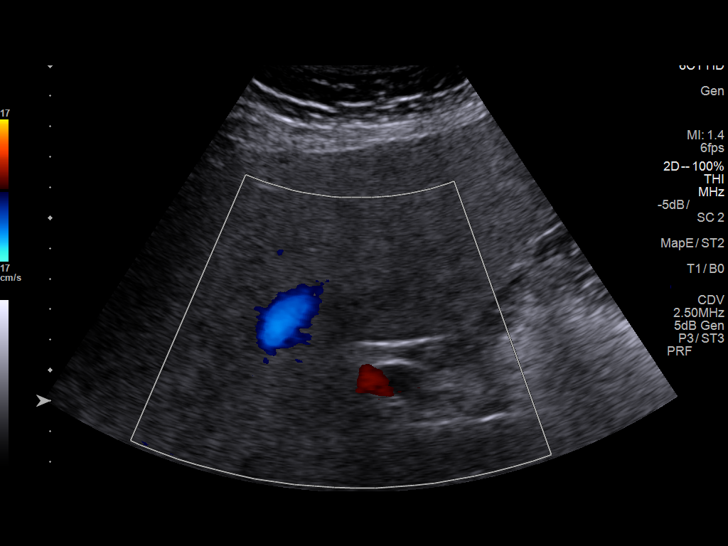
[im 16/43]
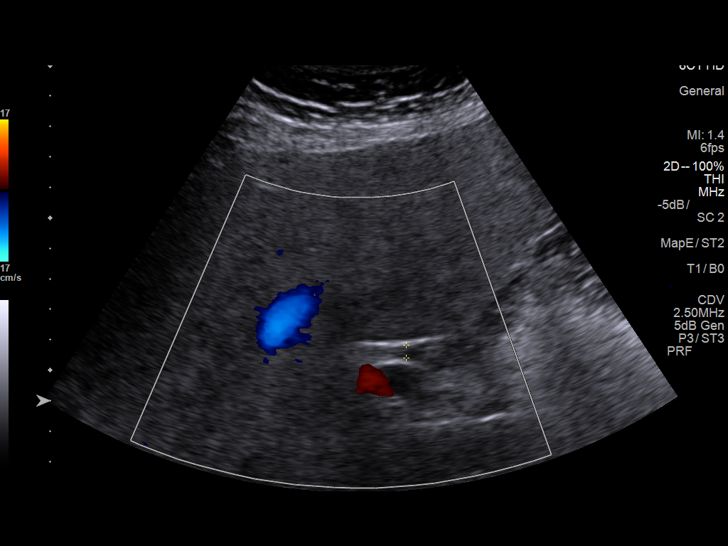
[im 20/43]
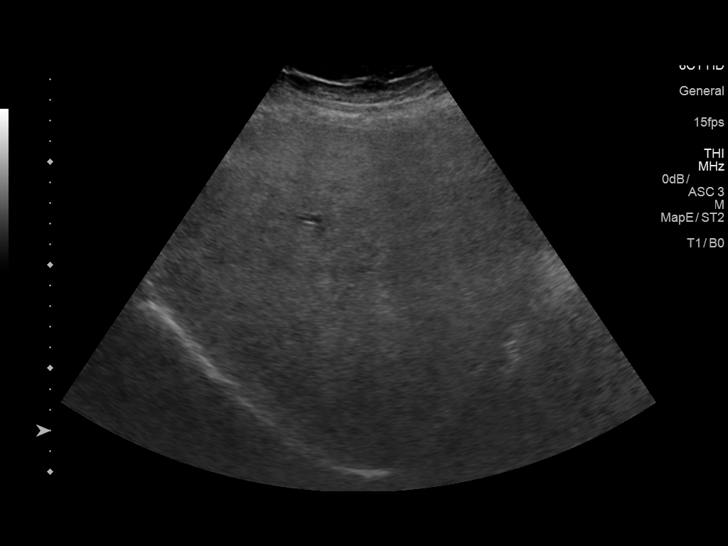
[im 23/43]
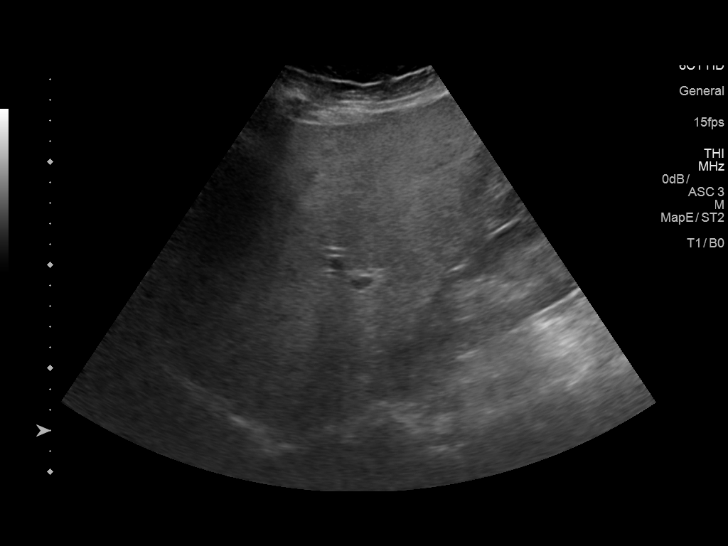
[im 27/43]
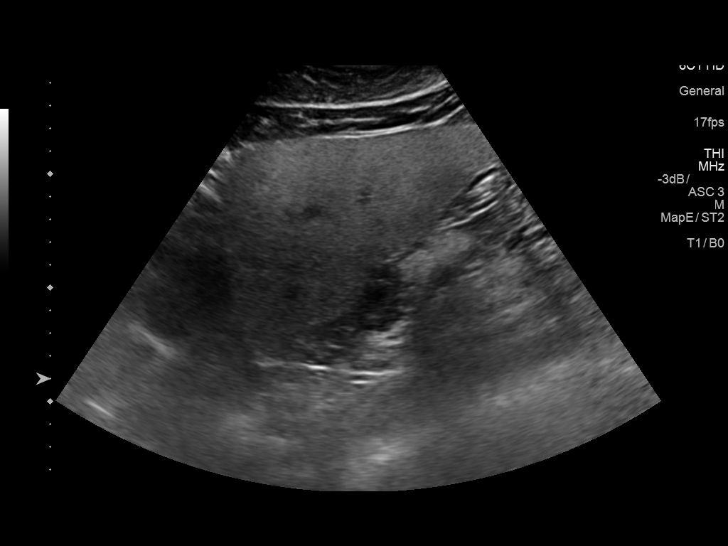
[im 29/43]
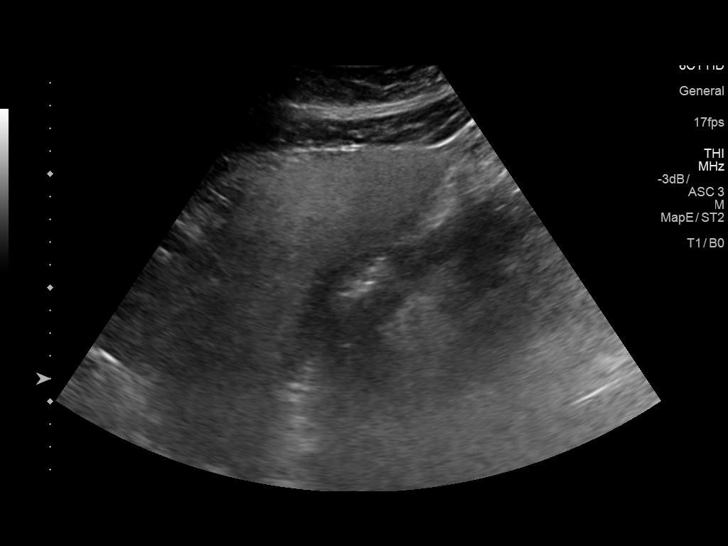
[im 32/43]
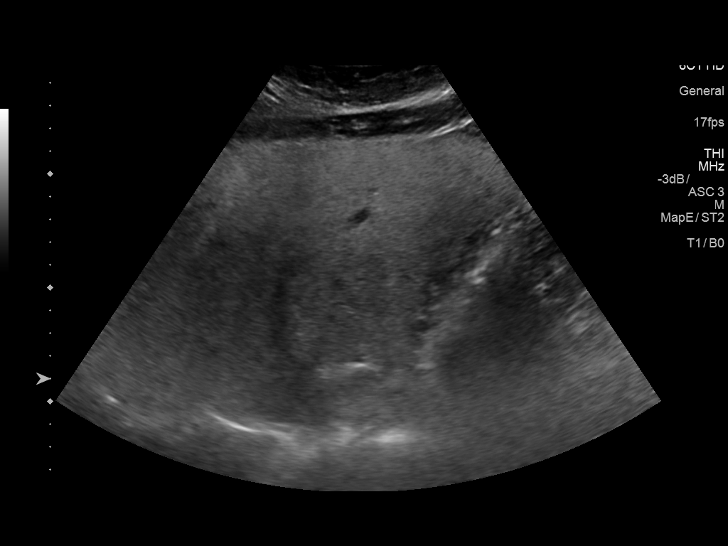
[im 36/43]
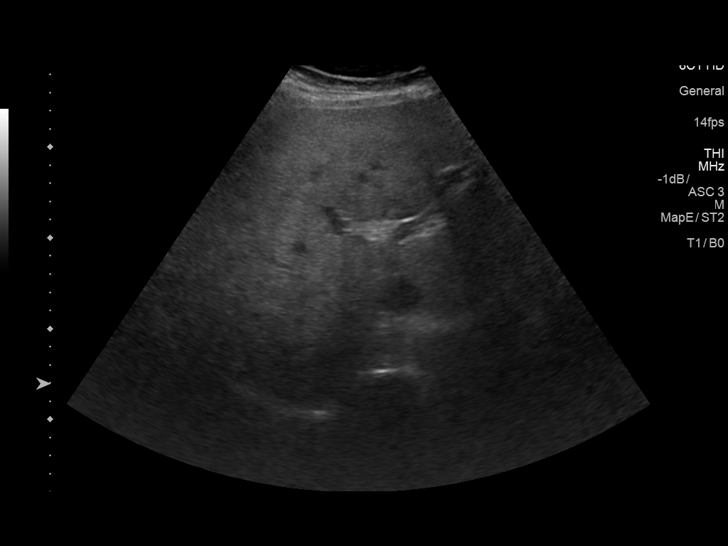
[im 39/43]
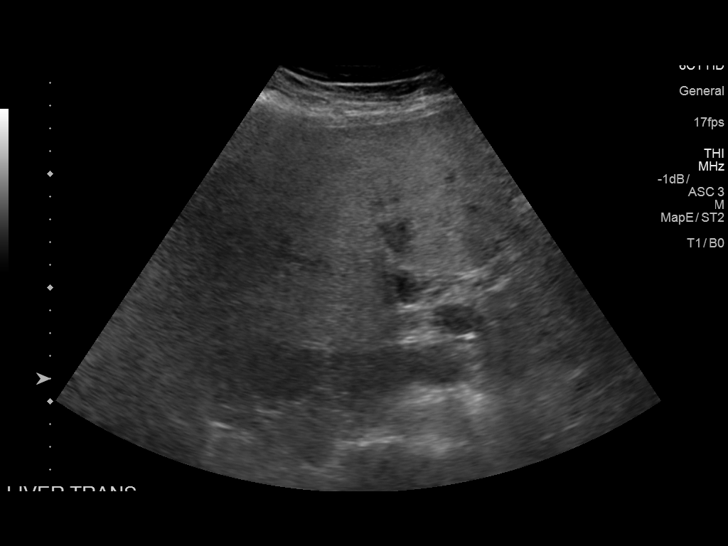
[im 43/43]
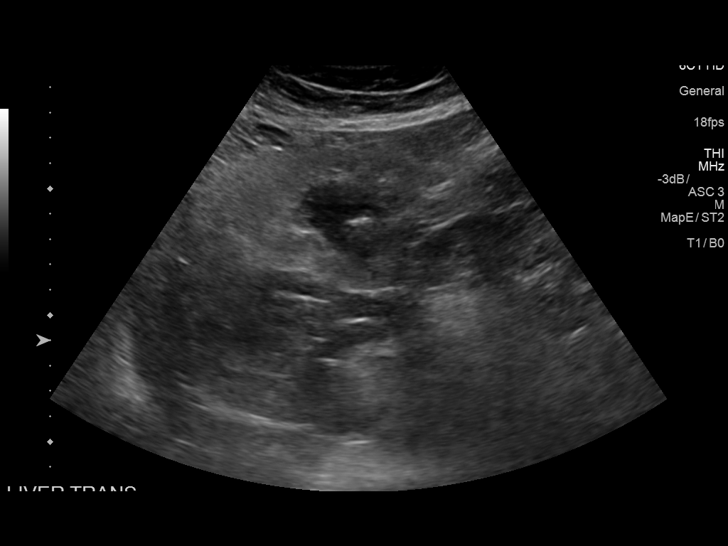

[14 of 25 positions shown; findings below may reference images not displayed]

FINDINGS: Gallbladder:

No gallstones or wall thickening visualized. No sonographic Murphy
sign noted by sonographer.

Common bile duct:

Diameter: Normal caliber, 4 mm

Liver:

Increased echotexture compatible with fatty infiltration. No focal
abnormality or biliary ductal dilatation. Focal fatty sparing near
the gallbladder fossa. Portal vein is patent on color Doppler
imaging with normal direction of blood flow towards the liver.
IMPRESSION: Orbixiitop infiltration of the liver.  No acute findings.

## 2018-06-05 ENCOUNTER — Other Ambulatory Visit (INDEPENDENT_AMBULATORY_CARE_PROVIDER_SITE_OTHER): Payer: Self-pay | Admitting: Physician Assistant

## 2018-06-05 DIAGNOSIS — I10 Essential (primary) hypertension: Secondary | ICD-10-CM

## 2018-06-13 ENCOUNTER — Ambulatory Visit (INDEPENDENT_AMBULATORY_CARE_PROVIDER_SITE_OTHER): Payer: Self-pay | Admitting: Physician Assistant

## 2018-06-13 ENCOUNTER — Encounter (INDEPENDENT_AMBULATORY_CARE_PROVIDER_SITE_OTHER): Payer: Self-pay

## 2018-06-26 ENCOUNTER — Ambulatory Visit (INDEPENDENT_AMBULATORY_CARE_PROVIDER_SITE_OTHER): Payer: 59 | Admitting: Physician Assistant

## 2018-06-26 ENCOUNTER — Encounter (INDEPENDENT_AMBULATORY_CARE_PROVIDER_SITE_OTHER): Payer: Self-pay | Admitting: Physician Assistant

## 2018-06-26 VITALS — BP 133/81 | HR 68 | Temp 97.8°F | Ht 67.0 in | Wt 261.0 lb

## 2018-06-26 DIAGNOSIS — E7849 Other hyperlipidemia: Secondary | ICD-10-CM

## 2018-06-26 DIAGNOSIS — F3289 Other specified depressive episodes: Secondary | ICD-10-CM | POA: Diagnosis not present

## 2018-06-26 DIAGNOSIS — E559 Vitamin D deficiency, unspecified: Secondary | ICD-10-CM

## 2018-06-26 DIAGNOSIS — Z9189 Other specified personal risk factors, not elsewhere classified: Secondary | ICD-10-CM

## 2018-06-26 DIAGNOSIS — Z6841 Body Mass Index (BMI) 40.0 and over, adult: Secondary | ICD-10-CM

## 2018-06-26 DIAGNOSIS — R7303 Prediabetes: Secondary | ICD-10-CM

## 2018-06-26 NOTE — Progress Notes (Signed)
Office: [phone]  /  Fax: [phone]   HPI:   Chief Complaint: OBESITY Brandy Love is here to discuss her progress with her obesity treatment plan. She is on the Category 3 plan and is following her eating plan approximately 10 % of the time. She states she is helping her in-laws with moving every day for exercise. Brandy Love reports that she has had a lot of stress at home, which causes her to emotionally eat. Brandy Love has not been following the plan. Her weight is 261 lb (118.4 kg) today and has had a weight gain of 4 pounds over a period of 4 weeks since her last visit. She has lost 12 lbs since starting treatment with Brandy Love.  Vitamin D deficiency Brandy Love has a diagnosis of vitamin D deficiency. She is currently taking vit D and denies nausea, vomiting or muscle weakness.  Pre-Diabetes Brandy Love has a diagnosis of prediabetes based on her elevated Hgb A1c and was informed this puts her at greater risk of developing diabetes. She is not taking metformin currently and continues to work on diet and exercise to decrease risk of diabetes. She denies polyphagia.  At risk for diabetes Brandy Love is at higher than average risk for developing diabetes due to her obesity and prediabetes. She currently denies polyuria or polydipsia.  Hyperlipidemia Brandy Love has hyperlipidemia and she is not on medications. She has been trying to improve her cholesterol levels with intensive lifestyle modification including a low saturated fat diet, exercise and weight loss. She denies any chest pain, claudication or myalgias.  Depression with emotional eating behaviors Brandy Love reports that she is a stress eater and has fallen off the plan due to stress at home. She struggles with emotional eating and using food for comfort to the extent that it is negatively impacting her health. She often snacks when she is not hungry. Brandy Love sometimes feels she is out of control and then feels guilty that she made poor food choices. She has been working on behavior  modification techniques to help reduce her emotional eating and has been somewhat successful. She shows no sign of suicidal or homicidal ideations.  Depression screen PHQ 2/9 10/17/2017  Decreased Interest 3  Down, Depressed, Hopeless 3  PHQ - 2 Score 6  Altered sleeping 2  Tired, decreased energy 3  Change in appetite 3  Feeling bad or failure about yourself  2  Trouble concentrating 2  Moving slowly or fidgety/restless 1  Suicidal thoughts 2  PHQ-9 Score 21     ASSESSMENT AND PLAN:  Vitamin D deficiency - Plan: VITAMIN D 25 Hydroxy (Vit-D Deficiency, Fractures)  Prediabetes - Plan: Comprehensive metabolic panel, Insulin, random, Hemoglobin A1c  Other hyperlipidemia - Plan: Lipid Panel With LDL/HDL Ratio  Other depression - with emotional eating  At risk for diabetes mellitus  Class 3 severe obesity with serious comorbidity and body mass index (BMI) of 40.0 to 44.9 in adult, unspecified obesity type (HCC)  PLAN:  Vitamin D Deficiency Brandy Love was informed that low vitamin D levels contributes to fatigue and are associated with obesity, breast, and colon cancer. She agrees to continue to take prescription Vit D @50 ,000 IU every week and will follow up for routine testing of vitamin D, at least 2-3 times per year. She was informed of the risk of over-replacement of vitamin D and agrees to not increase her dose unless she discusses this with Brandy Love first. We will check labs today and [name] will follow up at the agreed upon time.  Pre-Diabetes  Brandy Love will continue to work on weight loss, exercise, and decreasing simple carbohydrates in her diet to help decrease the risk of diabetes. She was informed that eating too many simple carbohydrates or too many calories at one sitting increases the likelihood of GI side effects. We will check labs today and Brandy Love agreed to follow up with Brandy Love as directed to monitor her progress.  Diabetes risk counseling Brandy Love was given extended (15 minutes) diabetes  prevention counseling today. She is 62 y.o. female and has risk factors for diabetes including obesity and prediabetes. We discussed intensive lifestyle modifications today with an emphasis on weight loss as well as increasing exercise and decreasing simple carbohydrates in her diet.  Hyperlipidemia Brandy Love was informed of the American Heart Association Guidelines emphasizing intensive lifestyle modifications as the first line treatment for hyperlipidemia. We discussed many lifestyle modifications today in depth, and Brandy Love will continue to work on decreasing saturated fats such as fatty red meat, butter and many fried foods. She will also increase vegetables and lean protein in her diet and continue to work on exercise and weight loss efforts. We will check labs today and Brandy Love will follow up as directed.  Depression with Emotional Eating Behaviors We discussed behavior modification techniques today to help Brandy Love deal with her emotional eating and depression. We will refer Brandy Love to Brandy Love our bariatric psychologist.  Obesity Brandy Love is currently in the action stage of change. As such, her goal is to continue with weight loss efforts She has agreed to follow the Category 3 plan Brandy Love has been instructed to work up to a goal of 150 minutes of combined cardio and strengthening exercise per week for weight loss and overall health benefits. We discussed the following Behavioral Modification Strategies today: work on meal planning and easy cooking plans and holiday eating strategies   Brandy Love has agreed to follow up with our clinic in 3 weeks. She was informed of the importance of frequent follow up visits to maximize her success with intensive lifestyle modifications for her multiple health conditions.  ALLERGIES: Allergies  Allergen Reactions  . Adhesive [Tape]     Rash with tape from first stage interstim  . Ibuprofen     REACTION: vertigo  . Amlodipine Hives, Swelling and Rash  . Betadine [Povidone  Iodine] Rash     ALL TOPICAL IODINES    MEDICATIONS: Current Outpatient Medications on File Prior to Visit  Medication Sig Dispense Refill  . albuterol (PROVENTIL HFA;VENTOLIN HFA) 108 (90 Base) MCG/ACT inhaler Inhale into the lungs every 6 (six) hours as needed for wheezing or shortness of breath.    . beclomethasone (QVAR) 80 MCG/ACT inhaler Inhale 2 puffs into the lungs 2 (two) times daily.    . chlorthalidone (HYGROTON) 25 MG tablet TAKE 1 TABLET BY MOUTH DAILY. 30 tablet 0  . Clocortolone Pivalate (CLODERM) 0.1 % cream Apply 1 application topically 2 (two) times daily.    [name] dexlansoprazole (DEXILANT) 60 MG capsule Take 60 mg by mouth daily.    [name] losartan (COZAAR) 100 MG tablet Take 100 mg by mouth daily.    . Multiple Vitamins-Minerals (MULTIVITAMIN WITH MINERALS) tablet Take 1 tablet by mouth daily.    . sertraline (ZOLOFT) 100 MG tablet Take 100 mg by mouth 2 (two) times daily.    [name] tolterodine (DETROL LA) 4 MG 24 hr capsule Take 4 mg by mouth daily.    . Vitamin D, Ergocalciferol, (DRISDOL) 1.25 MG (50000 UT) CAPS capsule TAKE 1 CAPSULE BY MOUTH  EVERY 7 DAYS. 4 capsule 0   No current facility-administered medications on file prior to visit.     PAST MEDICAL HISTORY: Past Medical History:  Diagnosis Date  . Asthma    controlled well  . Bladder prolapse, female, acquired   . Edema extremities    lower legs at times  . Gastric ulcer   . GERD (gastroesophageal reflux disease)   . Glaucoma   . High blood pressure   . OAB (overactive bladder)   . Prediabetes     PAST SURGICAL HISTORY: Past Surgical History:  Procedure Laterality Date  . ABDOMINAL HYSTERECTOMY  1993  . APPENDECTOMY    . CATARACT EXTRACTION  2016  . CYSTECTOMY     1994 and 1982  . INTERSTIM IMPLANT PLACEMENT  04/10/2012   Procedure: Barrie Lyme IMPLANT FIRST STAGE;  Surgeon: [name], MD;  Location: [facility];  Service: Urology;  Laterality: N/A;  rad tech ok per vicki at [facility]     . INTERSTIM IMPLANT PLACEMENT  04/10/2012   Procedure: Barrie Lyme IMPLANT SECOND STAGE;  Surgeon: [name], MD;  Location: [facility];  Service: Urology;  Laterality: N/A;  . LOWER LEG SOFT TISSUE TUMOR EXCISION  1994   cyst left ankle -involved muscle removed-limited mobility now  . NASAL SEPTUM SURGERY      SOCIAL HISTORY: Social History   Tobacco Use  . Smoking status: Never Smoker  . Smokeless tobacco: Never Used  Substance Use Topics  . Alcohol use: No  . Drug use: No    FAMILY HISTORY: Family History  Problem Relation Age of Onset  . Asthma Mother   . Diabetes Mother   . Hernia Mother   . Obesity Mother   . Heart disease Father   . Prostate cancer Father     ROS: Review of Systems  Constitutional: Negative for weight loss.  Cardiovascular: Negative for chest pain and claudication.  Gastrointestinal: Negative for diarrhea, nausea and vomiting.  Genitourinary: Negative for frequency.  Musculoskeletal: Negative for myalgias.       Negative for muscle weakness  Endo/Heme/Allergies: Negative for polydipsia.       Negative for polyphagia  Psychiatric/Behavioral: Positive for depression. Negative for suicidal ideas.       + Stress    PHYSICAL EXAM: Blood pressure 133/81, pulse 68, temperature 97.8 F (36.6 C), temperature source Oral, height 5\' 7"  (1.702 m), weight 261 lb (118.4 kg), SpO2 94 %. Body mass index is 40.88 kg/m. Physical Exam Vitals signs reviewed.  Constitutional:      Appearance: Normal appearance. She is well-developed. She is obese.  Cardiovascular:     Rate and Rhythm: Normal rate.  Pulmonary:     Effort: Pulmonary effort is normal.  Musculoskeletal: Normal range of motion.  Skin:    General: Skin is warm and dry.  Neurological:     Mental Status: She is alert and oriented to person, place, and time.  Psychiatric:        Mood and Affect: Mood normal.        Behavior: Behavior normal.     RECENT LABS AND  TESTS: BMET    Component Value Date/Time   NA 143 03/07/2018 1009   K 4.0 03/07/2018 1009   CL 99 03/07/2018 1009   CO2 29 03/07/2018 1009   GLUCOSE 130 (H) 03/07/2018 1009   GLUCOSE 114 (H) 04/10/2012 0731   BUN 14 03/07/2018 1009   CREATININE 0.86 03/07/2018 1009   CALCIUM  9.6 03/07/2018 1009   GFRNONAA 73 03/07/2018 1009   GFRAA 84 03/07/2018 1009   Lab Results  Component Value Date   HGBA1C 6.4 (H) 03/07/2018   HGBA1C 6.3 (H) 10/17/2017   Lab Results  Component Value Date   INSULIN 35.1 (H) 03/07/2018   INSULIN 25.3 (H) 10/17/2017   CBC    Component Value Date/Time   WBC 5.8 10/17/2017 0938   WBC 6.8 09/07/2010 0356   RBC 4.53 10/17/2017 0938   RBC 4.90 09/07/2010 0356   HGB 12.2 10/17/2017 0938   HCT 37.5 10/17/2017 0938   PLT 235 09/07/2010 0356   MCV 83 10/17/2017 0938   MCH 26.9 10/17/2017 0938   MCH 26.3 09/07/2010 0356   MCHC 32.5 10/17/2017 0938   MCHC 33.0 09/07/2010 0356   RDW 15.7 (H) 10/17/2017 0938   LYMPHSABS 2.2 10/17/2017 0938   MONOABS 0.5 09/07/2010 0356   EOSABS 0.3 10/17/2017 0938   BASOSABS 0.0 10/17/2017 0938   Iron/TIBC/Ferritin/ %Sat No results found for: IRON, TIBC, FERRITIN, IRONPCTSAT Lipid Panel     Component Value Date/Time   CHOL 195 03/07/2018 1009   TRIG 150 (H) 03/07/2018 1009   HDL 41 03/07/2018 1009   LDLCALC 124 (H) 03/07/2018 1009   Hepatic Function Panel     Component Value Date/Time   PROT 7.3 03/07/2018 1009   ALBUMIN 4.2 03/07/2018 1009   AST 26 03/07/2018 1009   ALT 33 (H) 03/07/2018 1009   ALKPHOS 76 03/07/2018 1009   BILITOT 0.3 03/07/2018 1009      Component Value Date/Time   TSH 2.920 10/17/2017 0938    Ref. Range 03/07/2018 10:09  Vitamin D, 25-Hydroxy Latest Ref Range: 30.0 - 100.0 ng/mL 34.0     OBESITY BEHAVIORAL INTERVENTION VISIT  Today's visit was # 10   Starting weight: 273 lbs Starting date: 10/17/2017 Today's weight : 261 lbs Today's date: 06/26/2018 Total lbs lost to date:  12   ASK: We discussed the diagnosis of obesity with Brandy Love today and Brandy Love agreed to give Brandy Love permission to discuss obesity behavioral modification therapy today.  ASSESS: Brandy Love has the diagnosis of obesity and her BMI today is 40.87 Brandy Love is in the action stage of change   ADVISE: Nara was educated on the multiple health risks of obesity as well as the benefit of weight loss to improve her health. She was advised of the need for long term treatment and the importance of lifestyle modifications to improve her current health and to decrease her risk of future health problems.  AGREE: Multiple dietary modification options and treatment options were discussed and  Kyesha agreed to follow the recommendations documented in the above note.  ARRANGE: Brandy Love was educated on the importance of frequent visits to treat obesity as outlined per CMS and USPSTF guidelines and agreed to schedule her next follow up appointment today.  Corey Skains, am acting as transcriptionist for Abby Potash, PA-C I, Abby Potash, PA-C have reviewed above note and agree with its content

## 2018-06-27 LAB — VITAMIN D 25 HYDROXY (VIT D DEFICIENCY, FRACTURES): Vit D, 25-Hydroxy: 30.8 ng/mL (ref 30.0–100.0)

## 2018-06-27 LAB — COMPREHENSIVE METABOLIC PANEL
ALT: 23 IU/L (ref 0–32)
AST: 18 IU/L (ref 0–40)
Albumin/Globulin Ratio: 1.3 (ref 1.2–2.2)
Albumin: 4.2 g/dL (ref 3.6–4.8)
Alkaline Phosphatase: 79 IU/L (ref 39–117)
BILIRUBIN TOTAL: 0.3 mg/dL (ref 0.0–1.2)
BUN/Creatinine Ratio: 17 (ref 12–28)
BUN: 14 mg/dL (ref 8–27)
CHLORIDE: 98 mmol/L (ref 96–106)
CO2: 26 mmol/L (ref 20–29)
Calcium: 9.6 mg/dL (ref 8.7–10.3)
Creatinine, Ser: 0.84 mg/dL (ref 0.57–1.00)
GFR calc non Af Amer: 75 mL/min/{1.73_m2} (ref >59)
GFR, EST AFRICAN AMERICAN: 86 mL/min/{1.73_m2} (ref >59)
GLUCOSE: 130 mg/dL — AB (ref 65–99)
Globulin, Total: 3.2 g/dL (ref 1.5–4.5)
POTASSIUM: 4 mmol/L (ref 3.5–5.2)
Sodium: 141 mmol/L (ref 134–144)
TOTAL PROTEIN: 7.4 g/dL (ref 6.0–8.5)

## 2018-06-27 LAB — HEMOGLOBIN A1C
ESTIMATED AVERAGE GLUCOSE: 131 mg/dL
HEMOGLOBIN A1C: 6.2 % — AB (ref 4.8–5.6)

## 2018-06-27 LAB — LIPID PANEL WITH LDL/HDL RATIO
Cholesterol, Total: 200 mg/dL — ABNORMAL HIGH (ref 100–199)
HDL: 44 mg/dL (ref >39)
LDL Calculated: 127 mg/dL — ABNORMAL HIGH (ref 0–99)
LDl/HDL Ratio: 2.9 ratio (ref 0.0–3.2)
Triglycerides: 143 mg/dL (ref 0–149)
VLDL Cholesterol Cal: 29 mg/dL (ref 5–40)

## 2018-06-27 LAB — INSULIN, RANDOM: INSULIN: 30 u[IU]/mL — ABNORMAL HIGH (ref 2.6–24.9)

## 2018-06-28 DIAGNOSIS — H01114 Allergic dermatitis of left upper eyelid: Secondary | ICD-10-CM | POA: Diagnosis not present

## 2018-06-28 DIAGNOSIS — H01112 Allergic dermatitis of right lower eyelid: Secondary | ICD-10-CM | POA: Diagnosis not present

## 2018-06-28 DIAGNOSIS — H01111 Allergic dermatitis of right upper eyelid: Secondary | ICD-10-CM | POA: Diagnosis not present

## 2018-06-28 DIAGNOSIS — H401131 Primary open-angle glaucoma, bilateral, mild stage: Secondary | ICD-10-CM | POA: Diagnosis not present

## 2018-07-09 ENCOUNTER — Other Ambulatory Visit (INDEPENDENT_AMBULATORY_CARE_PROVIDER_SITE_OTHER): Payer: Self-pay | Admitting: Physician Assistant

## 2018-07-09 DIAGNOSIS — I10 Essential (primary) hypertension: Secondary | ICD-10-CM

## 2018-07-09 DIAGNOSIS — E559 Vitamin D deficiency, unspecified: Secondary | ICD-10-CM
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
## 2018-07-17 ENCOUNTER — Ambulatory Visit (INDEPENDENT_AMBULATORY_CARE_PROVIDER_SITE_OTHER): Payer: 59 | Admitting: Physician Assistant

## 2018-07-17 ENCOUNTER — Encounter (INDEPENDENT_AMBULATORY_CARE_PROVIDER_SITE_OTHER): Payer: Self-pay | Admitting: Physician Assistant

## 2018-07-17 ENCOUNTER — Ambulatory Visit (INDEPENDENT_AMBULATORY_CARE_PROVIDER_SITE_OTHER): Payer: 59 | Admitting: Psychology

## 2018-07-17 VITALS — BP 130/80 | HR 94 | Temp 97.9°F | Ht 67.0 in | Wt 262.0 lb

## 2018-07-17 DIAGNOSIS — Z9189 Other specified personal risk factors, not elsewhere classified: Secondary | ICD-10-CM | POA: Diagnosis not present

## 2018-07-17 DIAGNOSIS — F331 Major depressive disorder, recurrent, moderate: Secondary | ICD-10-CM | POA: Diagnosis not present

## 2018-07-17 DIAGNOSIS — E559 Vitamin D deficiency, unspecified: Secondary | ICD-10-CM | POA: Diagnosis not present

## 2018-07-17 DIAGNOSIS — R7303 Prediabetes: Secondary | ICD-10-CM | POA: Diagnosis not present

## 2018-07-17 DIAGNOSIS — Z6841 Body Mass Index (BMI) 40.0 and over, adult: Secondary | ICD-10-CM

## 2018-07-17 MED ORDER — VITAMIN D (ERGOCALCIFEROL) 1.25 MG (50000 UNIT) PO CAPS: 50000.0000 [IU] | ORAL_CAPSULE | ORAL | 0 refills | Status: DC

## 2018-07-17 NOTE — Progress Notes (Signed)
Office: [phone]  /  Fax: [phone]   HPI:   Chief Complaint: OBESITY Brandy Love is here to discuss her progress with her obesity treatment plan. She is on the Category 3 plan and is following her eating plan approximately 10 % of the time. She states she is exercising 0 minutes 0 times per week. Brandy Love struggled to follow the plan during the holidays. She has not been getting enough protein in. Her weight is 262 lb (118.8 kg) today and has gained 1 lbs since her last visit. She has lost 11 lbs since starting treatment with [place].  Vitamin D deficiency Brandy Love has a diagnosis of vitamin D deficiency. She is currently taking prescription Vit D and denies nausea, vomiting or muscle weakness. Her last Vit D level was at goal.  Pre-Diabetes Brandy Love has a diagnosis of prediabetes based on her elevated Hgb A1c and was informed this puts her at greater risk of developing diabetes. She is not on any medications currently and continues to work on diet and exercise to decrease risk of diabetes. She denies polyphagia.  At risk for diabetes Brandy Love is at higher than averagerisk for developing diabetes due to her obesity. She currently denies polyuria or polydipsia.   ASSESSMENT AND PLAN:  Vitamin D deficiency - Plan: Vitamin D, Ergocalciferol, (DRISDOL) 1.25 MG (50000 UT) CAPS capsule  Prediabetes  At risk for diabetes mellitus  Class 3 severe obesity with serious comorbidity and body mass index (BMI) of 40.0 to 44.9 in adult, unspecified obesity type (Brandy Love)  PLAN:  Vitamin D Deficiency Brandy Love was informed that low vitamin D levels contributes to fatigue and are associated with obesity, breast, and colon cancer. She agrees to continue to take prescription Vit D 50,000 IU twice weekly # 10 with no refills and will follow up for routine testing of vitamin D, at least 2-3 times per year. She was informed of the risk of over-replacement of vitamin D and agrees to not increase her dose unless she discusses this  with [place] first. Brandy Love agrees to follow up with our clinic in 3 weeks.  Pre-Diabetes Brandy Love will continue to work on weight loss, exercise, and decreasing simple carbohydrates in her diet to help decrease the risk of diabetes. She was informed that eating too many simple carbohydrates or too many calories at one sitting increases the likelihood of GI side effects. Brandy Love agrees to follow up with our clinic in 3 weeks.  Diabetes risk counselling Brandy Love was given extended (15 minutes) diabetes prevention counseling today. She is 63 y.o. female and has risk factors for diabetes including obesity. We discussed intensive lifestyle modifications today with an emphasis on weight loss as well as increasing exercise and decreasing simple carbohydrates in her diet.  Obesity Brandy Love is currently in the action stage of change. As such, her goal is to continue with weight loss efforts She has agreed to follow the Category 3 plan Brandy Love has been instructed to work up to a goal of 150 minutes of combined cardio and strengthening exercise per week for weight loss and overall health benefits. We discussed the following Behavioral Modification Stratagies today: work on meal planning and cooking strategies and keeping healthy foods in the home.   Brandy Love has agreed to follow up with our clinic in 3 weeks. She was informed of the importance of frequent follow up visits to maximize her success with intensive lifestyle modifications for her multiple health conditions.  ALLERGIES: Allergies  Allergen Reactions  . Adhesive [Tape]  Rash with tape from first stage interstim  . Ibuprofen     REACTION: vertigo  . Amlodipine Hives, Swelling and Rash  . Betadine [Povidone Iodine] Rash     ALL TOPICAL IODINES    MEDICATIONS: Current Outpatient Medications on File Prior to Visit  Medication Sig Dispense Refill  . albuterol (PROVENTIL HFA;VENTOLIN HFA) 108 (90 Base) MCG/ACT inhaler Inhale into the lungs every 6 (six) hours as  needed for wheezing or shortness of breath.    . beclomethasone (QVAR) 80 MCG/ACT inhaler Inhale 2 puffs into the lungs 2 (two) times daily.    . chlorthalidone (HYGROTON) 25 MG tablet TAKE 1 TABLET BY MOUTH DAILY. 30 tablet 0  . Clocortolone Pivalate (CLODERM) 0.1 % cream Apply 1 application topically 2 (two) times daily.    [name] dexlansoprazole (DEXILANT) 60 MG capsule Take 60 mg by mouth daily.    [name] losartan (COZAAR) 100 MG tablet Take 100 mg by mouth daily.    . Multiple Vitamins-Minerals (MULTIVITAMIN WITH MINERALS) tablet Take 1 tablet by mouth daily.    . sertraline (ZOLOFT) 100 MG tablet Take 100 mg by mouth 2 (two) times daily.    [name] tolterodine (DETROL LA) 4 MG 24 hr capsule Take 4 mg by mouth daily.     No current facility-administered medications on file prior to visit.     PAST MEDICAL HISTORY: Past Medical History:  Diagnosis Date  . Asthma    controlled well  . Bladder prolapse, female, acquired   . Edema extremities    lower legs at times  . Gastric ulcer   . GERD (gastroesophageal reflux disease)   . Glaucoma   . High blood pressure   . OAB (overactive bladder)   . Prediabetes     PAST SURGICAL HISTORY: Past Surgical History:  Procedure Laterality Date  . ABDOMINAL HYSTERECTOMY  1993  . APPENDECTOMY    . CATARACT EXTRACTION  2016  . CYSTECTOMY     1994 and 1982  . INTERSTIM IMPLANT PLACEMENT  04/10/2012   Procedure: Barrie Lyme IMPLANT FIRST STAGE;  Surgeon: [name], MD;  Location: [facility];  Service: Urology;  Laterality: N/A;  rad tech ok per vicki at [facility]   . INTERSTIM IMPLANT PLACEMENT  04/10/2012   Procedure: Barrie Lyme IMPLANT SECOND STAGE;  Surgeon: [name], MD;  Location: [facility];  Service: Urology;  Laterality: N/A;  . LOWER LEG SOFT TISSUE TUMOR EXCISION  1994   cyst left ankle -involved muscle removed-limited mobility now  . NASAL SEPTUM SURGERY      SOCIAL HISTORY: Social History   Tobacco  Use  . Smoking status: Never Smoker  . Smokeless tobacco: Never Used  Substance Use Topics  . Alcohol use: No  . Drug use: No    FAMILY HISTORY: Family History  Problem Relation Age of Onset  . Asthma Mother   . Diabetes Mother   . Hernia Mother   . Obesity Mother   . Heart disease Father   . Prostate cancer Father     ROS: Review of Systems  Constitutional: Negative for weight loss.  Gastrointestinal: Negative for nausea and vomiting.  Musculoskeletal:       Negative for muscle weakness  Endo/Heme/Allergies: Negative for polydipsia.       Negative for polyphagia Negative for polydipsia    PHYSICAL EXAM: Blood pressure 130/80, pulse 94, temperature 97.9 F (36.6 C), temperature source Oral, height 5\' 7"  (1.702 m), weight 262 lb (118.8 kg),  SpO2 97 %. Body mass index is 41.04 kg/m. Physical Exam Vitals signs reviewed.  Constitutional:      Appearance: Normal appearance. She is obese.  Cardiovascular:     Rate and Rhythm: Normal rate.     Pulses: Normal pulses.  Pulmonary:     Effort: Pulmonary effort is normal.  Musculoskeletal: Normal range of motion.  Skin:    General: Skin is warm and dry.  Neurological:     Mental Status: She is alert and oriented to person, place, and time.  Psychiatric:        Mood and Affect: Mood normal.        Behavior: Behavior normal.     RECENT LABS AND TESTS: BMET    Component Value Date/Time   NA 141 06/26/2018 1019   K 4.0 06/26/2018 1019   CL 98 06/26/2018 1019   CO2 26 06/26/2018 1019   GLUCOSE 130 (H) 06/26/2018 1019   GLUCOSE 114 (H) 04/10/2012 0731   BUN 14 06/26/2018 1019   CREATININE 0.84 06/26/2018 1019   CALCIUM 9.6 06/26/2018 1019   GFRNONAA 75 06/26/2018 1019   GFRAA 86 06/26/2018 1019   Lab Results  Component Value Date   HGBA1C 6.2 (H) 06/26/2018   HGBA1C 6.4 (H) 03/07/2018   HGBA1C 6.3 (H) 10/17/2017   Lab Results  Component Value Date   INSULIN 30.0 (H) 06/26/2018   INSULIN 35.1 (H)  03/07/2018   INSULIN 25.3 (H) 10/17/2017   CBC    Component Value Date/Time   WBC 5.8 10/17/2017 0938   WBC 6.8 09/07/2010 0356   RBC 4.53 10/17/2017 0938   RBC 4.90 09/07/2010 0356   HGB 12.2 10/17/2017 0938   HCT 37.5 10/17/2017 0938   PLT 235 09/07/2010 0356   MCV 83 10/17/2017 0938   MCH 26.9 10/17/2017 0938   MCH 26.3 09/07/2010 0356   MCHC 32.5 10/17/2017 0938   MCHC 33.0 09/07/2010 0356   RDW 15.7 (H) 10/17/2017 0938   LYMPHSABS 2.2 10/17/2017 0938   MONOABS 0.5 09/07/2010 0356   EOSABS 0.3 10/17/2017 0938   BASOSABS 0.0 10/17/2017 0938   Iron/TIBC/Ferritin/ %Sat No results found for: IRON, TIBC, FERRITIN, IRONPCTSAT Lipid Panel     Component Value Date/Time   CHOL 200 (H) 06/26/2018 1019   TRIG 143 06/26/2018 1019   HDL 44 06/26/2018 1019   LDLCALC 127 (H) 06/26/2018 1019   Hepatic Function Panel     Component Value Date/Time   PROT 7.4 06/26/2018 1019   ALBUMIN 4.2 06/26/2018 1019   AST 18 06/26/2018 1019   ALT 23 06/26/2018 1019   ALKPHOS 79 06/26/2018 1019   BILITOT 0.3 06/26/2018 1019      Component Value Date/Time   TSH 2.920 10/17/2017 0938    Ref. Range 06/26/2018 10:19  Vitamin D, 25-Hydroxy Latest Ref Range: 30.0 - 100.0 ng/mL 30.8     OBESITY BEHAVIORAL INTERVENTION VISIT  Today's visit was # 10   Starting weight: 273 lbs Starting date: 10/17/2017 Today's weight : 262 lbs Today's date: 07/17/2018 Total lbs lost to date: 10  ASK: We discussed the diagnosis of obesity with Brandy Love today and Brandy Love agreed to give [place] permission to discuss obesity behavioral modification therapy today.  ASSESS: Brandy Love has the diagnosis of obesity and her BMI today is 41.03 Brandy Love is in the action stage of change   ADVISE: Brandy Love was educated on the multiple health risks of obesity as well as the benefit of weight loss to improve  her health. She was advised of the need for long term treatment and the importance of lifestyle modifications to improve her  current health and to decrease her risk of future health problems.  AGREE: Multiple dietary modification options and treatment options were discussed and  Brandy Love agreed to follow the recommendations documented in the above note.  ARRANGE: Brandy Love was educated on the importance of frequent visits to treat obesity as outlined per CMS and USPSTF guidelines and agreed to schedule her next follow up appointment today.  I, Tammy Wysor, am acting as Location manager for [employer], PA-C I, Abby Potash, PA-C have reviewed above note and agree with its content

## 2018-07-26 NOTE — Progress Notes (Unsigned)
Office: [phone]  /  Fax: [phone]    Date: July 31, 2018   Time Seen:*** Duration:*** Provider: Glennie Isle, Psy.D. Type of Session: Individual Therapy  Type of Contact: Face-to-face  HPI: Brandy Love was referred by Abby Potash, PA-C due to depression with emotional eating behaviors, and was seen for an initial appointment by this provider on July 17, 2018. Per the note for the visit with Abby Potash, PA-C on June 26, 2018, "[name] is a stress eater and has fallen off the plan due to stress at home. She struggleswith emotional eating and using food for comfort to the extent that it is negatively impacting herhealth. Sheoften snacks when sheis not hungry. Renasometimes feels sheis out of control and then feels guilty that shemade poor food choices. Brandy Love been working on behavior modification techniques to help reduce heremotional eating and has been somewhat successful.Sheshows no sign of suicidal or homicidal ideations." In addition, per the note for the initial visit with Brandy Love on October 17, 2017, Brandy Love started gaining weight during pregnancy and herheaviest weight ever was 277pounds. During the initial appointment with Brandy Love, Brandy Love reported experiencing the following: significant food cravings issues , snacking frequently in the evenings, frequently drinking liquids with calories, frequently eating larger portions than normal , struggling with emotional eating and skipping meals frequently. She also reported shewakes up frquently in the middle of the night to eat andhas problems with excessive hunger.   During the initial appointment with this provider, Brandy Love reported, "I am a stress eater." She discussed ongoing stressors that have contributed to her eating habits.  More specifically, she shared her husband became disabled in 52 due to a fall. In 2008-10-26, her mother reportedly got sick and was hospitalized for 33 days. She  subsequently passed away in 10/26/2009. Six months after her mother's passing, Brandy Love shared her father had a "massive heart attack" resulting in her becoming his primary caregiver for 18 months. Her father passed away in 2010-10-27. Currently, Brandy Love shared she is helping take care of her in-laws that reside next door to her. Moreover, she shared her last episode of emotional eating was last weekend, which was reportedly triggered by caring for her in-laws resulting in her consuming cookies and ice cream. She shared she engages in overeating, "off and on" since 10/26/04 when she lost her job. She tends to crave sweets. Moreover, Brandy Love shared an exacerbation in emotional eating when her mother passed away and noted she has experienced emotional eating prior to that, but it was always "sporadic." She denied a history of binge eating, but noted the onset of overeating in 2004/10/26 after losing her job. Brandy Love denied a history of engagement in purging and other compensatory strategies and has never been diagnosed with an eating disorder. Furthermore, Brandy Love was asked to complete a questionnaire assessing various behaviors related to emotional eating. Brandy Love endorsed the following: overeat when you are celebrating, experience food cravings on a regular basis, eat certain foods when you are anxious, stressed, depressed, or your feelings are hurt, use food to help you cope with emotional situations, overeat when you are angry or upset, overeat when you are worried about something, overeat frequently when you are bored or lonely and overeat when you are alone, but eat much less when you are with other people.  During today's appointment, Brandy Love reported ***  Session Content: Session focused on the following treatment goal: decrease emotional eating. The session was initiated with the administration of the PHQ-9 and  GAD-7, as well as a brief check-in.   Due to Brandy Love's previous self-report regarding ideation and plan regarding assisted suicide for her  mother-in-law, a risk assessment was completed.     *** [name] was receptive to today's session as evidenced by openness to sharing, responsiveness to feedback, and ***.  Mental Status Examination: Brandy Love arrived on time for the appointment. She presented as appropriately dressed and groomed. Brandy Love appeared her stated age and demonstrated adequate orientation to time, place, person, and purpose of the appointment. She also demonstrated appropriate eye contact. No psychomotor abnormalities or behavioral peculiarities noted. Her mood was {gbmood:21757} with congruent affect. Her thought processes were logical, linear, and goal-directed. No hallucinations, delusions, bizarre thinking or behavior reported or observed. Judgment, insight, and impulse control appeared to be grossly intact. There was no evidence of paraphasias (i.e., errors in speech, gross mispronunciations, and word substitutions), repetition deficits, or disturbances in volume or prosody (i.e., rhythm and intonation). There was no evidence of attention or memory impairments. Brandy Love denied current suicidal and homicidal ideation, plan and intent.   Structured Assessment Results: The Patient Health Questionnaire-9 (PHQ-9) is a self-report measure that assesses symptoms and severity of depression over the course of the last two weeks. Brandy Love obtained a score of *** suggesting {GBPHQ9SEVERITY:21752}. Brandy Love finds the endorsed symptoms to be {gbphq9difficulty:21754}.  The Generalized Anxiety Disorder-7 (GAD-7) is a brief self-report measure that assesses symptoms of anxiety over the course of the last two weeks. Brandy Love obtained a score of *** suggesting {gbgad7severity:21753}.  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 Lucrezia has demonstrated progress in her goal as evidenced by ***  Plan: Brandy Love continues to appear able and willing to participate as evidenced by engagement in reciprocal conversation, and asking questions for clarification as appropriate.*** The next appointment will be scheduled in {gbweeks:21758}. The next session will focus on reviewing learned skills, and working towards the established treatment goal.***

## 2018-07-31 ENCOUNTER — Encounter (INDEPENDENT_AMBULATORY_CARE_PROVIDER_SITE_OTHER): Payer: Self-pay

## 2018-07-31 ENCOUNTER — Ambulatory Visit (INDEPENDENT_AMBULATORY_CARE_PROVIDER_SITE_OTHER): Payer: Self-pay | Admitting: Psychology

## 2018-07-31 ENCOUNTER — Ambulatory Visit (INDEPENDENT_AMBULATORY_CARE_PROVIDER_SITE_OTHER): Payer: Self-pay | Admitting: Physician Assistant

## 2018-08-13 ENCOUNTER — Other Ambulatory Visit (INDEPENDENT_AMBULATORY_CARE_PROVIDER_SITE_OTHER): Payer: Self-pay | Admitting: Physician Assistant

## 2018-08-13 DIAGNOSIS — I10 Essential (primary) hypertension: Secondary | ICD-10-CM

## 2018-08-13 NOTE — Progress Notes (Addendum)
Office: [phone]  /  Fax: [phone]    Date: August 14, 2018   Time Seen: 8:30am Duration: 34 minutes Provider: Glennie Isle, Psy.D. Type of Session: Individual Therapy  Type of Contact: Face-to-face  Session Content: Brandy Love is a 63 y.o. female presenting for a follow-up appointment to address the previously established treatment goal of decreasing emotional eating. The session was initiated with the administration of the PHQ-9 and GAD-7, as well as a brief check-in. Brandy Love shared she has been sick, and that her mother-in-law suffered a "scary fall." She added, "She is fine now" and the incident was reported to hospice care. In addition, Brandy Love reported the physician came for a "surprise visit" to ensure equipment was being utilized appropriately. No concerns were noted. Based on Lakynn's previous self-report regarding ideation related to assisting her mother-in-law in "assisted suicide," a risk assessment was completed. She denied experiencing ideation since the last appointment, and noted she was "venting" at the first appointment with this provider. Intent was assessed on a scale of one to ten, where one is no intent of assisting in suicide, and 10 is extremely likely that she will assist in the future. Brandy Love quickly nodded her head no and stated her intent is a zero. She added, "I would never do it. I don't want to end up in jail. I just hate to see her suffer." Since the last appointment with this provider, Brandy Love described feeling "overwhelmed" resulting in an increase in cravings and "stress eating." Nevertheless, she described making better choices (e.g., eating fruits). She also noted an increase in stress at work. Psychoeducation regarding triggers for emotional eating was provided. Brandy Love was provided a handout, and encouraged to utilize the handout between now and the next appointment to increase awareness of triggers and frequency. Brandy Love agreed. This provider also discussed behavioral strategies  for specific triggers, such as developing a working plan for meals daily to ensure ingredients are in the house. Brandy Love was receptive to today's session as evidenced by openness to sharing, responsiveness to feedback, and willingness to identify her triggers for emotional eating.  Mental Status Examination: Brandy Love arrived on time for the appointment. She presented as appropriately dressed and groomed. Brandy Love appeared her stated age and demonstrated adequate orientation to time, place, person, and purpose of the appointment. She also demonstrated appropriate eye contact. No psychomotor abnormalities or behavioral peculiarities noted. Her mood was euthymic with congruent affect. Her thought processes were logical, linear, and goal-directed. No hallucinations, delusions, bizarre thinking or behavior reported or observed. Judgment, insight, and impulse control appeared to be grossly intact. There was no evidence of paraphasias (i.e., errors in speech, gross mispronunciations, and word substitutions), repetition deficits, or disturbances in volume or prosody (i.e., rhythm and intonation). There was no evidence of attention or memory impairments. Brandy Love denied current suicidal and homicidal ideation, plan and intent.   Structured Assessment Results: The Patient Health Questionnaire-9 (PHQ-9) is a self-report measure that assesses symptoms and severity of depression over the course of the last two weeks. Brandy Love obtained a score of 16 suggesting moderately severe depression. Brandy Love finds the endorsed symptoms to be somewhat difficult. Depression screen PHQ 2/9 08/14/2018  Decreased Interest 2  Down, Depressed, Hopeless 2  PHQ - 2 Score 4  Altered sleeping 3  Tired, decreased energy 3  Change in appetite 3  Feeling bad or failure about yourself  1  Trouble concentrating 2  Moving slowly or fidgety/restless 0  Suicidal thoughts 0  PHQ-9 Score 16   The Generalized  Anxiety Disorder-7 (GAD-7) is a brief self-report measure  that assesses symptoms of anxiety over the course of the last two weeks. Brandy Love obtained a score of 12 suggesting moderate anxiety. GAD 7 : Generalized Anxiety Score 08/14/2018  Nervous, Anxious, on Edge 1  Control/stop worrying 2  Worry too much - different things 2  Trouble relaxing 2  Restless 1  Easily annoyed or irritable 2  Afraid - awful might happen 2  Total GAD 7 Score 12  Anxiety Difficulty Somewhat difficult   Interventions:  Administration of PHQ-9 and GAD-7 for symptom monitoring Review of content from the previous session Empathic reflections and validation Risk assessment Psychoeducation regarding triggers for emotional eating Rapport building Brief chart review  DSM-5 Diagnosis: 296.32 (F33.1) Major Depressive Disorder, Recurrent Episode, Moderate, With Anxious Distress, Moderate  Treatment Goal & Progress: During the initial appointment with this provider, the following treatment goal was established: decrease emotional eating. Progress is limited, as Brandy Love has just begun treatment with this provider; however, she is receptive to the interaction and interventions and rapport is being established. During today's appointment, Brandy Love demonstrated increased awareness in hunger patterns, and expressed willingness to identify her triggers for emotional eating.  Plan: Brandy Love continues to appear able and willing to participate as evidenced by engagement in reciprocal conversation, and asking questions for clarification as appropriate. This provider recommended Brandy Love schedule an appointment in two weeks; however, due to work related deadlines and finances, Brandy Love requested an appointment in three weeks. The next session will focus on reviewing triggers for emotional eating and working towards the established treatment goal.

## 2018-08-14 ENCOUNTER — Ambulatory Visit (INDEPENDENT_AMBULATORY_CARE_PROVIDER_SITE_OTHER): Payer: 59 | Admitting: Physician Assistant

## 2018-08-14 ENCOUNTER — Encounter (INDEPENDENT_AMBULATORY_CARE_PROVIDER_SITE_OTHER): Payer: Self-pay | Admitting: Physician Assistant

## 2018-08-14 ENCOUNTER — Ambulatory Visit (INDEPENDENT_AMBULATORY_CARE_PROVIDER_SITE_OTHER): Payer: 59 | Admitting: Psychology

## 2018-08-14 VITALS — BP 139/79 | HR 64 | Temp 97.6°F | Ht 67.0 in | Wt 265.0 lb

## 2018-08-14 DIAGNOSIS — Z6841 Body Mass Index (BMI) 40.0 and over, adult: Secondary | ICD-10-CM

## 2018-08-14 DIAGNOSIS — I10 Essential (primary) hypertension: Secondary | ICD-10-CM | POA: Diagnosis not present

## 2018-08-14 DIAGNOSIS — Z9189 Other specified personal risk factors, not elsewhere classified: Secondary | ICD-10-CM

## 2018-08-14 DIAGNOSIS — E559 Vitamin D deficiency, unspecified: Secondary | ICD-10-CM

## 2018-08-14 DIAGNOSIS — F331 Major depressive disorder, recurrent, moderate: Secondary | ICD-10-CM | POA: Diagnosis not present

## 2018-08-14 MED ORDER — VITAMIN D (ERGOCALCIFEROL) 1.25 MG (50000 UNIT) PO CAPS: 50000.0000 [IU] | ORAL_CAPSULE | ORAL | 0 refills | Status: DC

## 2018-08-14 MED ORDER — CHLORTHALIDONE 25 MG PO TABS: 25.0000 mg | ORAL_TABLET | Freq: Every day | ORAL | 0 refills | Status: DC

## 2018-08-14 NOTE — Progress Notes (Signed)
Office: [phone]  /  Fax: [phone]   HPI:   Chief Complaint: OBESITY Brandy Love is here to discuss her progress with her obesity treatment plan. She is on the Category 3 plan and is following her eating plan approximately 10-15% of the time. She states she is exercising 0 minutes 0 times per week. Brandy Love reports that she is not getting all of her protein or vegetables in. She states that she is under a lot of stress at work and at home.   Her weight is 265 lb (120.2 kg) today and she has gained 3 pounds since her last visit. She has lost 8 lbs since starting treatment with [place].  Vitamin D deficiency Brandy Love has a diagnosis of vitamin D deficiency. She is currently taking prescription Vit D and denies nausea, vomiting or muscle weakness. Brandy Love's last level was not at goal.   Hypertension Brandy Love is a 62 Love.o. female with hypertension.  Brandy Love denies chest pain. She is currently on chlorthalidone and losartan and is working on weight loss to help control her blood pressure with the goal of decreasing her risk of heart attack and stroke. Brandy Love's blood pressure is currently controlled.  At risk for cardiovascular disease Brandy Love is at a higher than average risk for cardiovascular disease due to obesity. She currently denies any chest pain.   ALLERGIES: Allergies  Allergen Reactions  . Adhesive [Tape]     Rash with tape from first stage interstim  . Ibuprofen     REACTION: vertigo  . Amlodipine Hives, Swelling and Rash  . Betadine [Povidone Iodine] Rash     ALL TOPICAL IODINES    MEDICATIONS: Current Outpatient Medications on File Prior to Visit  Medication Sig Dispense Refill  . albuterol (PROVENTIL HFA;VENTOLIN HFA) 108 (90 Base) MCG/ACT inhaler Inhale into the lungs every 6 (six) hours as needed for wheezing or shortness of breath.    . beclomethasone (QVAR) 80 MCG/ACT inhaler Inhale 2 puffs into the lungs 2 (two) times daily.    [name] Clocortolone Pivalate (CLODERM) 0.1 %  cream Apply 1 application topically 2 (two) times daily.    [name] dexlansoprazole (DEXILANT) 60 MG capsule Take 60 mg by mouth daily.    [name] losartan (COZAAR) 100 MG tablet Take 100 mg by mouth daily.    . Multiple Vitamins-Minerals (MULTIVITAMIN WITH MINERALS) tablet Take 1 tablet by mouth daily.    . sertraline (ZOLOFT) 100 MG tablet Take 100 mg by mouth 2 (two) times daily.    [name] tolterodine (DETROL LA) 4 MG 24 hr capsule Take 4 mg by mouth daily.     No current facility-administered medications on file prior to visit.     PAST MEDICAL HISTORY: Past Medical History:  Diagnosis Date  . Asthma    controlled well  . Bladder prolapse, female, acquired   . Edema extremities    lower legs at times  . Gastric ulcer   . GERD (gastroesophageal reflux disease)   . Glaucoma   . High blood pressure   . OAB (overactive bladder)   . Prediabetes     PAST SURGICAL HISTORY: Past Surgical History:  Procedure Laterality Date  . ABDOMINAL HYSTERECTOMY  1993  . APPENDECTOMY    . CATARACT EXTRACTION  2016  . CYSTECTOMY     1994 and 1982  . INTERSTIM IMPLANT PLACEMENT  04/10/2012   Procedure: Barrie Lyme IMPLANT FIRST STAGE;  Surgeon: [name], MD;  Location: [facility];  Service: Urology;  Laterality: N/A;  rad tech ok per vicki at [facility]   . INTERSTIM IMPLANT PLACEMENT  04/10/2012   Procedure: Barrie Lyme IMPLANT SECOND STAGE;  Surgeon: [name], MD;  Location: [facility];  Service: Urology;  Laterality: N/A;  . LOWER LEG SOFT TISSUE TUMOR EXCISION  1994   cyst left ankle -involved muscle removed-limited mobility now  . NASAL SEPTUM SURGERY      SOCIAL HISTORY: Social History   Tobacco Use  . Smoking status: Never Smoker  . Smokeless tobacco: Never Used  Substance Use Topics  . Alcohol use: No  . Drug use: No    FAMILY HISTORY: Family History  Problem Relation Age of Onset  . Asthma Mother   . Diabetes Mother   . Hernia Mother   . Obesity  Mother   . Heart disease Father   . Prostate cancer Father     ROS: Review of Systems  Constitutional: Negative for weight loss.  Cardiovascular: Negative for chest pain.  Gastrointestinal: Negative for nausea and vomiting.  Musculoskeletal:       Negative muscle weakness    PHYSICAL EXAM: Blood pressure 139/79, pulse 64, temperature 97.6 F (36.4 C), temperature source Oral, height 5\' 7"  (1.702 m), weight 265 lb (120.2 kg), SpO2 97 %. Body mass index is 41.5 kg/m. Physical Exam Vitals signs reviewed.  Constitutional:      Appearance: Normal appearance. She is obese.  Cardiovascular:     Rate and Rhythm: Normal rate.     Pulses: Normal pulses.  Pulmonary:     Effort: Pulmonary effort is normal.     Breath sounds: Normal breath sounds.  Musculoskeletal: Normal range of motion.  Skin:    General: Skin is warm and dry.  Neurological:     Mental Status: She is alert and oriented to person, place, and time.  Psychiatric:        Mood and Affect: Mood normal.        Behavior: Behavior normal.     RECENT LABS AND TESTS: BMET    Component Value Date/Time   NA 141 06/26/2018 1019   K 4.0 06/26/2018 1019   CL 98 06/26/2018 1019   CO2 26 06/26/2018 1019   GLUCOSE 130 (H) 06/26/2018 1019   GLUCOSE 114 (H) 04/10/2012 0731   BUN 14 06/26/2018 1019   CREATININE 0.84 06/26/2018 1019   CALCIUM 9.6 06/26/2018 1019   GFRNONAA 75 06/26/2018 1019   GFRAA 86 06/26/2018 1019   Lab Results  Component Value Date   HGBA1C 6.2 (H) 06/26/2018   HGBA1C 6.4 (H) 03/07/2018   HGBA1C 6.3 (H) 10/17/2017   Lab Results  Component Value Date   INSULIN 30.0 (H) 06/26/2018   INSULIN 35.1 (H) 03/07/2018   INSULIN 25.3 (H) 10/17/2017   CBC    Component Value Date/Time   WBC 5.8 10/17/2017 0938   WBC 6.8 09/07/2010 0356   RBC 4.53 10/17/2017 0938   RBC 4.90 09/07/2010 0356   HGB 12.2 10/17/2017 0938   HCT 37.5 10/17/2017 0938   PLT 235 09/07/2010 0356   MCV 83 10/17/2017 0938    MCH 26.9 10/17/2017 0938   MCH 26.3 09/07/2010 0356   MCHC 32.5 10/17/2017 0938   MCHC 33.0 09/07/2010 0356   RDW 15.7 (H) 10/17/2017 0938   LYMPHSABS 2.2 10/17/2017 0938   MONOABS 0.5 09/07/2010 0356   EOSABS 0.3 10/17/2017 0938   BASOSABS 0.0 10/17/2017 0938   Iron/TIBC/Ferritin/ %Sat No results found for: IRON, TIBC,  FERRITIN, IRONPCTSAT Lipid Panel     Component Value Date/Time   CHOL 200 (H) 06/26/2018 1019   TRIG 143 06/26/2018 1019   HDL 44 06/26/2018 1019   LDLCALC 127 (H) 06/26/2018 1019   Hepatic Function Panel     Component Value Date/Time   PROT 7.4 06/26/2018 1019   ALBUMIN 4.2 06/26/2018 1019   AST 18 06/26/2018 1019   ALT 23 06/26/2018 1019   ALKPHOS 79 06/26/2018 1019   BILITOT 0.3 06/26/2018 1019      Component Value Date/Time   TSH 2.920 10/17/2017 0938   Results for JENNELL, JANOSIK (MRN [id number]) as of 08/14/2018 12:53  Ref. Range 06/26/2018 10:19  Vitamin D, 25-Hydroxy Latest Ref Range: 30.0 - 100.0 ng/mL 30.8    ASSESSMENT AND PLAN: Vitamin D deficiency - Plan: Vitamin D, Ergocalciferol, (DRISDOL) 1.25 MG (50000 UT) CAPS capsule  Essential hypertension - Plan: chlorthalidone (HYGROTON) 25 MG tablet  At risk for heart disease  Class 3 severe obesity with serious comorbidity and body mass index (BMI) of 40.0 to 44.9 in adult, unspecified obesity type (HCC)  PLAN:  Vitamin D Deficiency Brandy Love was informed that low vitamin D levels contributes to fatigue and are associated with obesity, breast, and colon cancer. Brandy Love agrees to continue taking prescription Vit D @50 ,000 IU every three days #10 with no refills. She will follow-up for routine testing of Vitamin D, at least 2-3 times per year. She was informed of the risk of over-replacement of Vitamin D and agrees to not increase her dose unless she discusses this with [place] first. Brandy Love agrees to follow-up with our clinic in 3 weeks.   Hypertension We discussed sodium restriction, working on healthy weight  loss, and a regular exercise program as the means to achieve improved blood pressure control. Brandy Love agreed with this plan and agreed to follow up as directed. We will continue to monitor her blood pressure as well as her progress with the above lifestyle modifications. Brandy Love will continue taking chlorthalidone daily #30 with no refills and will watch for signs of hypotension as she continues her lifestyle modifications.  Cardiovascular risk counseling Brandy Love was given extended (15 minutes) coronary artery disease prevention counseling today. She is 63 Love.o. female and has risk factors for heart disease including obesity. We discussed intensive lifestyle modifications today with an emphasis on specific weight loss instructions and strategies. Pt was also informed of the importance of increasing exercise and decreasing saturated fats to help prevent heart disease.  Obesity Kizzi is currently in the action stage of change. As such, her goal is to continue with weight loss efforts. She has agreed to follow the Category 3 plan. Talaysia has been instructed to work up to a goal of 150 minutes of combined cardio and strengthening exercise per week for weight loss and overall health benefits. We discussed the following Behavioral Modification Strategies today: increasing lean protein intake and work on meal planning and easy cooking plans.  Beyonce has agreed to follow up with our clinic in 3 weeks. She was informed of the importance of frequent follow up visits to maximize her success with intensive lifestyle modifications for her multiple health conditions.   OBESITY BEHAVIORAL INTERVENTION VISIT  Today's visit was #12  Starting weight: 273 lbs  Starting date: 10/17/2017 Today's weight : 265 lbs  Today's date: 08/14/2018 Total lbs lost to date: 8  ASK: We discussed the diagnosis of obesity with Nelda Marseille today and Janashia agreed to give [place] permission to  discuss obesity behavioral modification therapy  today.  ASSESS: Jayli has the diagnosis of obesity and her BMI today is 41.50. Suzy is in the action stage of change.  ADVISE: Darsha was educated on the multiple health risks of obesity as well as the benefit of weight loss to improve her health. She was advised of the need for long term treatment and the importance of lifestyle modifications.  AGREE: Multiple dietary modification options and treatment options were discussed and  Lynasia agreed to the above obesity treatment plan.  Migdalia Dk, am acting as transcriptionist for Abby Potash, PA-C I, Abby Potash, PA-C have reviewed above note and agree with its content

## 2018-09-04 ENCOUNTER — Telehealth (INDEPENDENT_AMBULATORY_CARE_PROVIDER_SITE_OTHER): Payer: Self-pay | Admitting: Psychology

## 2018-09-04 NOTE — Telephone Encounter (Addendum)
  Office: [phone]  /  Fax: [phone]  Date of Call: September 04, 2018 Time of Call: 3:11pm Duration of Call: 4 minutes Provider: Glennie Isle, PsyD  CONTENT: Brandy Love called earlier in the day to reschedule this week's appointment with this provider due to a death in the family. Thus, this provider called to check-in. She reported her mother-in-law passed away earlier this week, and explained, "She fell and broke her tail bone." The fall reportedly resulted in a protrusion, and the "tissue died around it" resulting in her losing mobility. Brandy Love further shared nurses administered Morphine to help with pain management, and the plan was to move her yesterday to the [facility]; however, she passed away prior to that. A brief risk assessment was completed; Brandy Love denied experiencing suicidal and homicidal ideation, plan, and intent. She noted she "appreciated" this provider checking-in.   PLAN: Brandy Love is scheduled for an appointment with this provider on September 24, 2018. She acknowledged understanding that she may call and ask for an appointment sooner if deemed necessary.

## 2018-09-04 NOTE — Progress Notes (Unsigned)
  Office: [phone]  /  Fax: [phone]    Date: September 06, 2018   Time Seen:*** Duration:*** Provider: Glennie Isle, Psy.D. Type of Session: Individual Therapy  Type of Contact: Face-to-face  Session Content: Brandy Love is a 63 y.o. female presenting for a follow-up appointment to address the previously established treatment goal of decreasing emotional eating. The session was initiated with the administration of the PHQ-9 and GAD-7, as well as a brief check-in. *** Brandy Love was receptive to today's session as evidenced by openness to sharing, responsiveness to feedback, and ***.  Mental Status Examination: Brandy Love arrived on time for the appointment. She presented as appropriately dressed and groomed. Brandy Love appeared her stated age and demonstrated adequate orientation to time, place, person, and purpose of the appointment. She also demonstrated appropriate eye contact. No psychomotor abnormalities or behavioral peculiarities noted. Her mood was {gbmood:21757} with congruent affect. Her thought processes were logical, linear, and goal-directed. No hallucinations, delusions, bizarre thinking or behavior reported or observed. Judgment, insight, and impulse control appeared to be grossly intact. There was no evidence of paraphasias (i.e., errors in speech, gross mispronunciations, and word substitutions), repetition deficits, or disturbances in volume or prosody (i.e., rhythm and intonation). There was no evidence of attention or memory impairments. Brandy Love denied current suicidal and homicidal ideation, plan and intent.   Structured Assessment Results: The Patient Health Questionnaire-9 (PHQ-9) is a self-report measure that assesses symptoms and severity of depression over the course of the last two weeks. Brandy Love obtained a score of *** suggesting {GBPHQ9SEVERITY:21752}. Brandy Love finds the endorsed symptoms to be {gbphq9difficulty:21754}.  The Generalized Anxiety Disorder-7 (GAD-7) is a brief self-report measure  that assesses symptoms of anxiety over the course of the last two weeks. Brandy Love obtained a score of *** suggesting {gbgad7severity:21753}.  Interventions:  {Interventions:22172}  DSM-5 Diagnosis: 296.32 (F33.1) Major Depressive Disorder, Recurrent Episode, Moderate, With Anxious Distress, Moderate  Treatment Goal & Progress: During the initial appointment with this provider, the following treatment goal was established: decrease emotional eating. Brandy Love has demonstrated progress in her goal as evidenced by ***  Plan: Brandy Love continues to appear able and willing to participate as evidenced by engagement in reciprocal conversation, and asking questions for clarification as appropriate.*** The next appointment will be scheduled in {gbweeks:21758}. The next session will focus on reviewing learned skills, and working towards the established treatment goal.***

## 2018-09-06 ENCOUNTER — Ambulatory Visit (INDEPENDENT_AMBULATORY_CARE_PROVIDER_SITE_OTHER): Payer: Self-pay | Admitting: Physician Assistant

## 2018-09-06 ENCOUNTER — Ambulatory Visit (INDEPENDENT_AMBULATORY_CARE_PROVIDER_SITE_OTHER): Payer: Self-pay | Admitting: Psychology

## 2018-09-08 ENCOUNTER — Other Ambulatory Visit (INDEPENDENT_AMBULATORY_CARE_PROVIDER_SITE_OTHER): Payer: Self-pay | Admitting: Physician Assistant

## 2018-09-08 DIAGNOSIS — I10 Essential (primary) hypertension: Secondary | ICD-10-CM

## 2018-09-12 DIAGNOSIS — R7303 Prediabetes: Secondary | ICD-10-CM | POA: Diagnosis not present

## 2018-09-12 DIAGNOSIS — I10 Essential (primary) hypertension: Secondary | ICD-10-CM | POA: Diagnosis not present

## 2018-09-18 NOTE — Progress Notes (Unsigned)
  Office: [phone]  /  Fax: [phone]    Date: 09/24/2018   Time Seen:*** Duration:*** Provider: Glennie Isle, Psy.D. Type of Session: Individual Therapy  Type of Contact: Face-to-face  Session Content: Brandy Love is a 63 y.o. female presenting for a follow-up appointment to address the previously established treatment goal of decreasing emotional eating. The session was initiated with the administration of the PHQ-9 and GAD-7, as well as a brief check-in. *** Brandy Love was receptive to today's session as evidenced by openness to sharing, responsiveness to feedback, and ***.  Mental Status Examination: Brandy Love arrived on time for the appointment. She presented as appropriately dressed and groomed. Brandy Love appeared her stated age and demonstrated adequate orientation to time, place, person, and purpose of the appointment. She also demonstrated appropriate eye contact. No psychomotor abnormalities or behavioral peculiarities noted. Her mood was {gbmood:21757} with congruent affect. Her thought processes were logical, linear, and goal-directed. No hallucinations, delusions, bizarre thinking or behavior reported or observed. Judgment, insight, and impulse control appeared to be grossly intact. There was no evidence of paraphasias (i.e., errors in speech, gross mispronunciations, and word substitutions), repetition deficits, or disturbances in volume or prosody (i.e., rhythm and intonation). There was no evidence of attention or memory impairments. Brandy Love denied current suicidal and homicidal ideation, plan and intent.   Structured Assessment Results: The Patient Health Questionnaire-9 (PHQ-9) is a self-report measure that assesses symptoms and severity of depression over the course of the last two weeks. Brandy Love obtained a score of *** suggesting {GBPHQ9SEVERITY:21752}. Brandy Love finds the endorsed symptoms to be {gbphq9difficulty:21754}.  The Generalized Anxiety Disorder-7 (GAD-7) is a brief self-report measure that  assesses symptoms of anxiety over the course of the last two weeks. Brandy Love obtained a score of *** suggesting {gbgad7severity:21753}.  Interventions:  {Interventions:22172}  DSM-5 Diagnosis: 296.32 (F33.1) Major Depressive Disorder, Recurrent Episode, Moderate, With Anxious Distress, Moderate  Treatment Goal & Progress: During the initial appointment with this provider, the following treatment goal was established: decrease emotional eating. Brandy Love has demonstrated progress in her goal as evidenced by ***  Plan: [name] continues to appear able and willing to participate as evidenced by engagement in reciprocal conversation, and asking questions for clarification as appropriate.*** The next appointment will be scheduled in {gbweeks:21758}. The next session will focus on reviewing learned skills, and working towards the established treatment goal.***

## 2018-09-24 ENCOUNTER — Ambulatory Visit (INDEPENDENT_AMBULATORY_CARE_PROVIDER_SITE_OTHER): Payer: 59 | Admitting: Psychology

## 2018-09-24 ENCOUNTER — Encounter (INDEPENDENT_AMBULATORY_CARE_PROVIDER_SITE_OTHER): Payer: Self-pay

## 2018-09-24 ENCOUNTER — Ambulatory Visit (INDEPENDENT_AMBULATORY_CARE_PROVIDER_SITE_OTHER): Payer: Self-pay | Admitting: Physician Assistant

## 2018-10-02 DIAGNOSIS — J0100 Acute maxillary sinusitis, unspecified: Secondary | ICD-10-CM | POA: Diagnosis not present

## 2018-10-02 DIAGNOSIS — J029 Acute pharyngitis, unspecified: Secondary | ICD-10-CM | POA: Diagnosis not present

## 2018-10-10 ENCOUNTER — Other Ambulatory Visit (INDEPENDENT_AMBULATORY_CARE_PROVIDER_SITE_OTHER): Payer: Self-pay | Admitting: Physician Assistant

## 2018-10-10 DIAGNOSIS — I10 Essential (primary) hypertension: Secondary | ICD-10-CM

## 2018-10-10 DIAGNOSIS — E559 Vitamin D deficiency, unspecified: Secondary | ICD-10-CM

## 2018-10-15 ENCOUNTER — Encounter (INDEPENDENT_AMBULATORY_CARE_PROVIDER_SITE_OTHER): Payer: Self-pay | Admitting: Family Medicine

## 2018-10-15 ENCOUNTER — Encounter (INDEPENDENT_AMBULATORY_CARE_PROVIDER_SITE_OTHER): Payer: Self-pay | Admitting: Physician Assistant

## 2018-10-16 ENCOUNTER — Ambulatory Visit (INDEPENDENT_AMBULATORY_CARE_PROVIDER_SITE_OTHER): Payer: Self-pay | Admitting: Psychology

## 2018-10-16 ENCOUNTER — Ambulatory Visit (INDEPENDENT_AMBULATORY_CARE_PROVIDER_SITE_OTHER): Payer: Self-pay | Admitting: Physician Assistant

## 2018-10-30 DIAGNOSIS — N3020 Other chronic cystitis without hematuria: Secondary | ICD-10-CM | POA: Diagnosis not present

## 2018-10-30 DIAGNOSIS — N3946 Mixed incontinence: Secondary | ICD-10-CM | POA: Diagnosis not present

## 2018-11-08 DIAGNOSIS — N8111 Cystocele, midline: Secondary | ICD-10-CM | POA: Diagnosis not present

## 2018-11-08 DIAGNOSIS — N3020 Other chronic cystitis without hematuria: Secondary | ICD-10-CM | POA: Diagnosis not present

## 2018-11-19 DIAGNOSIS — N3946 Mixed incontinence: Secondary | ICD-10-CM | POA: Diagnosis not present

## 2018-11-30 DIAGNOSIS — N3020 Other chronic cystitis without hematuria: Secondary | ICD-10-CM | POA: Diagnosis not present

## 2018-11-30 DIAGNOSIS — N8111 Cystocele, midline: Secondary | ICD-10-CM | POA: Diagnosis not present

## 2018-12-27 ENCOUNTER — Other Ambulatory Visit: Payer: Self-pay

## 2018-12-27 ENCOUNTER — Ambulatory Visit (INDEPENDENT_AMBULATORY_CARE_PROVIDER_SITE_OTHER): Payer: 59 | Admitting: Bariatrics

## 2018-12-27 ENCOUNTER — Encounter (INDEPENDENT_AMBULATORY_CARE_PROVIDER_SITE_OTHER): Payer: Self-pay | Admitting: Bariatrics

## 2018-12-27 DIAGNOSIS — F3289 Other specified depressive episodes: Secondary | ICD-10-CM | POA: Diagnosis not present

## 2018-12-27 DIAGNOSIS — E559 Vitamin D deficiency, unspecified: Secondary | ICD-10-CM | POA: Diagnosis not present

## 2018-12-27 DIAGNOSIS — Z6841 Body Mass Index (BMI) 40.0 and over, adult: Secondary | ICD-10-CM

## 2018-12-27 DIAGNOSIS — I10 Essential (primary) hypertension: Secondary | ICD-10-CM | POA: Diagnosis not present

## 2018-12-27 MED ORDER — BUPROPION HCL 100 MG PO TABS: 100.0000 mg | ORAL_TABLET | Freq: Every day | ORAL | 0 refills | Status: DC

## 2018-12-27 MED ORDER — CHLORTHALIDONE 25 MG PO TABS: 25.0000 mg | ORAL_TABLET | Freq: Every day | ORAL | 0 refills | Status: DC

## 2018-12-27 MED ORDER — VITAMIN D (ERGOCALCIFEROL) 1.25 MG (50000 UNIT) PO CAPS: 50000.0000 [IU] | ORAL_CAPSULE | ORAL | 0 refills | Status: DC

## 2018-12-31 ENCOUNTER — Encounter (INDEPENDENT_AMBULATORY_CARE_PROVIDER_SITE_OTHER): Payer: Self-pay | Admitting: Bariatrics

## 2018-12-31 NOTE — Progress Notes (Signed)
Office: [phone]  /  Fax: [phone] TeleHealth Visit:  Brandy Love has verbally consented to this TeleHealth visit today. The patient is located at home, the provider is located at the [employer] [facility] office. The participants in this visit include the listed provider and patient and any and all parties involved. The visit was conducted today via telephone. Brandy Love was unable to use realtime audiovisual technology today and the telehealth visit was conducted via telephone ( 22 minutes ).   HPI:   Chief Complaint: OBESITY Brandy Love is here to discuss her progress with her obesity treatment plan. She is on the Category 3 plan and is following her eating plan approximately 0 % of the time. She states she is exercising 0 minutes 0 times per week. Brandy Love has gained 6 pounds (weight 271 lbs). She normally sees Brandy Potash, PA-C. Her last visit was 08/14/18. She has done stress and emotional eating. Brandy Love has been craving sweets. We were unable to weigh the patient today for this TeleHealth visit. She feels as if she has gained weight since her last visit. She has lost 2 lbs since starting treatment with [place].  Vitamin D deficiency Brandy Love has a diagnosis of vitamin D deficiency. Brandy Love is currently taking vit D every three days and she denies nausea, vomiting or muscle weakness.  Hypertension Brandy Love is a 63 y.o. female with hypertension.  Brandy Love denies lightheadedness. She is working weight loss to help control her blood pressure with the goal of decreasing her risk of heart attack and stroke. Brandy Love blood pressure is currently controlled.  Depression with emotional eating behaviors Brandy Love is struggling with emotional eating and using food for comfort to the extent that it is negatively impacting her health. She often snacks when she is not hungry. Brandy Love sometimes feels she is out of control and then feels guilty that she made poor food choices. She has been working on behavior  modification techniques to help reduce her emotional eating and has been somewhat successful. Brandy Love has seen Dr. Mallie Mussel in the past. She shows no sign of suicidal or homicidal ideations.  ASSESSMENT AND PLAN:  Vitamin D deficiency - Plan: Vitamin D, Ergocalciferol, (DRISDOL) 1.25 MG (50000 UT) CAPS capsule  Essential hypertension - Plan: chlorthalidone (HYGROTON) 25 MG tablet  Other depression - with emotional eating   Class 3 severe obesity with serious comorbidity and body mass index (BMI) of 40.0 to 44.9 in adult, unspecified obesity type ([place])  PLAN:  Vitamin D Deficiency Brandy Love was informed that low vitamin D levels contributes to fatigue and are associated with obesity, breast, and colon cancer. She agrees to continue to take prescription Vit D @50 ,000 IU every three days #10 with no refills and will follow up for routine testing of vitamin D, at least 2-3 times per year. She was informed of the risk of over-replacement of vitamin D and agrees to not increase her dose unless she discusses this with [place] first. Brandy Love agrees to follow up as directed.  Hypertension We discussed sodium restriction, working on healthy weight loss, and a regular exercise program as the means to achieve improved blood pressure control. Brandy Love agreed with this plan and agreed to follow up as directed. We will continue to monitor her blood pressure as well as her progress with the above lifestyle modifications. She agrees to take Chlorthalidone 25 mg daily #30 with no refills and will watch for signs of hypotension as she continues her lifestyle modifications.  Depression  with Emotional Eating Behaviors We discussed behavior modification techniques today to help Brandy Love deal with her emotional eating and depression. She has agreed to start taking Wellbutrin SR 100 mg once daily in the morning #30 with no refills and follow up as directed. Will consider resuming visits with Dr. Mallie Mussel in the future, after she returns to the  office.  Obesity Brandy Love is currently in the action stage of change. As such, her goal is to continue with weight loss efforts She has agreed to follow the Category 3 plan Brandy Love has been instructed to work up to a goal of 150 minutes of combined cardio and strengthening exercise per week for weight loss and overall health benefits. We discussed the following Behavioral Modification Strategies today: planning for success, increase H2O intake, no skipping meals, keeping healthy foods in the home, increasing lean protein intake, decreasing simple carbohydrates, increasing vegetables, decrease eating out and work on meal planning and intentional eating  Brandy Love has agreed to follow up with our clinic in 2 weeks. She was informed of the importance of frequent follow up visits to maximize her success with intensive lifestyle modifications for her multiple health conditions.  ALLERGIES: Allergies  Allergen Reactions  . Adhesive [Tape]     Rash with tape from first stage interstim  . Ibuprofen     REACTION: vertigo  . Amlodipine Hives, Swelling and Rash  . Betadine [Povidone Iodine] Rash     ALL TOPICAL IODINES    MEDICATIONS: Current Outpatient Medications on File Prior to Visit  Medication Sig Dispense Refill  . albuterol (PROVENTIL HFA;VENTOLIN HFA) 108 (90 Base) MCG/ACT inhaler Inhale into the lungs every 6 (six) hours as needed for wheezing or shortness of breath.    . beclomethasone (QVAR) 80 MCG/ACT inhaler Inhale 2 puffs into the lungs 2 (two) times daily.    [name] Clocortolone Pivalate (CLODERM) 0.1 % cream Apply 1 application topically 2 (two) times daily.    [name] dexlansoprazole (DEXILANT) 60 MG capsule Take 60 mg by mouth daily.    [name] losartan (COZAAR) 100 MG tablet Take 100 mg by mouth daily.    . Multiple Vitamins-Minerals (MULTIVITAMIN WITH MINERALS) tablet Take 1 tablet by mouth daily.    . sertraline (ZOLOFT) 100 MG tablet Take 100 mg by mouth 2 (two) times daily.    [name] tolterodine (DETROL  LA) 4 MG 24 hr capsule Take 4 mg by mouth daily.     No current facility-administered medications on file prior to visit.     PAST MEDICAL HISTORY: Past Medical History:  Diagnosis Date  . Asthma    controlled well  . Bladder prolapse, female, acquired   . Edema extremities    lower legs at times  . Gastric ulcer   . GERD (gastroesophageal reflux disease)   . Glaucoma   . High blood pressure   . OAB (overactive bladder)   . Prediabetes     PAST SURGICAL HISTORY: Past Surgical History:  Procedure Laterality Date  . ABDOMINAL HYSTERECTOMY  1993  . APPENDECTOMY    . CATARACT EXTRACTION  2016  . CYSTECTOMY     1994 and 1982  . INTERSTIM IMPLANT PLACEMENT  04/10/2012   Procedure: Barrie Lyme IMPLANT FIRST STAGE;  Surgeon: [name], MD;  Location: [facility];  Service: Urology;  Laterality: N/A;  rad tech ok per vicki at main   . INTERSTIM IMPLANT PLACEMENT  04/10/2012   Procedure: Barrie Lyme IMPLANT SECOND STAGE;  Surgeon: [name], MD;  Location: [place];  Service: Urology;  Laterality: N/A;  . LOWER LEG SOFT TISSUE TUMOR EXCISION  1994   cyst left ankle -involved muscle removed-limited mobility now  . NASAL SEPTUM SURGERY      SOCIAL HISTORY: Social History   Tobacco Use  . Smoking status: Never Smoker  . Smokeless tobacco: Never Used  Substance Use Topics  . Alcohol use: No  . Drug use: No    FAMILY HISTORY: Family History  Problem Relation Age of Onset  . Asthma Mother   . Diabetes Mother   . Hernia Mother   . Obesity Mother   . Heart disease Father   . Prostate cancer Father     ROS: Review of Systems  Constitutional: Negative for weight loss.  Gastrointestinal: Negative for nausea and vomiting.  Musculoskeletal:       Negative for muscle weakness  Neurological:       Negative for lightheadedness  Psychiatric/Behavioral: Positive for depression. Negative for suicidal ideas.    PHYSICAL EXAM: Pt  in no acute distress  RECENT LABS AND TESTS: BMET    Component Value Date/Time   NA 141 06/26/2018 1019   K 4.0 06/26/2018 1019   CL 98 06/26/2018 1019   CO2 26 06/26/2018 1019   GLUCOSE 130 (H) 06/26/2018 1019   GLUCOSE 114 (H) 04/10/2012 0731   BUN 14 06/26/2018 1019   CREATININE 0.84 06/26/2018 1019   CALCIUM 9.6 06/26/2018 1019   GFRNONAA 75 06/26/2018 1019   GFRAA 86 06/26/2018 1019   Lab Results  Component Value Date   HGBA1C 6.2 (H) 06/26/2018   HGBA1C 6.4 (H) 03/07/2018   HGBA1C 6.3 (H) 10/17/2017   Lab Results  Component Value Date   INSULIN 30.0 (H) 06/26/2018   INSULIN 35.1 (H) 03/07/2018   INSULIN 25.3 (H) 10/17/2017   CBC    Component Value Date/Time   WBC 5.8 10/17/2017 0938   WBC 6.8 09/07/2010 0356   RBC 4.53 10/17/2017 0938   RBC 4.90 09/07/2010 0356   HGB 12.2 10/17/2017 0938   HCT 37.5 10/17/2017 0938   PLT 235 09/07/2010 0356   MCV 83 10/17/2017 0938   MCH 26.9 10/17/2017 0938   MCH 26.3 09/07/2010 0356   MCHC 32.5 10/17/2017 0938   MCHC 33.0 09/07/2010 0356   RDW 15.7 (H) 10/17/2017 0938   LYMPHSABS 2.2 10/17/2017 0938   MONOABS 0.5 09/07/2010 0356   EOSABS 0.3 10/17/2017 0938   BASOSABS 0.0 10/17/2017 0938   Iron/TIBC/Ferritin/ %Sat No results found for: IRON, TIBC, FERRITIN, IRONPCTSAT Lipid Panel     Component Value Date/Time   CHOL 200 (H) 06/26/2018 1019   TRIG 143 06/26/2018 1019   HDL 44 06/26/2018 1019   LDLCALC 127 (H) 06/26/2018 1019   Hepatic Function Panel     Component Value Date/Time   PROT 7.4 06/26/2018 1019   ALBUMIN 4.2 06/26/2018 1019   AST 18 06/26/2018 1019   ALT 23 06/26/2018 1019   ALKPHOS 79 06/26/2018 1019   BILITOT 0.3 06/26/2018 1019      Component Value Date/Time   TSH 2.920 10/17/2017 0938     Ref. Range 06/26/2018 10:19  Vitamin D, 25-Hydroxy Latest Ref Range: 30.0 - 100.0 ng/mL 30.8    I, Doreene Nest, am acting as Location manager for [employer]. Owens Shark, DO  I have reviewed the above  documentation for accuracy and completeness, and I agree with the above. -Jearld Lesch, DO

## 2019-01-10 ENCOUNTER — Telehealth (INDEPENDENT_AMBULATORY_CARE_PROVIDER_SITE_OTHER): Payer: Self-pay | Admitting: Bariatrics

## 2019-01-22 ENCOUNTER — Telehealth (INDEPENDENT_AMBULATORY_CARE_PROVIDER_SITE_OTHER): Payer: Self-pay | Admitting: Bariatrics

## 2019-02-05 ENCOUNTER — Other Ambulatory Visit (INDEPENDENT_AMBULATORY_CARE_PROVIDER_SITE_OTHER): Payer: Self-pay | Admitting: Bariatrics

## 2019-02-05 ENCOUNTER — Telehealth (INDEPENDENT_AMBULATORY_CARE_PROVIDER_SITE_OTHER): Payer: Self-pay | Admitting: Psychology

## 2019-02-05 DIAGNOSIS — I10 Essential (primary) hypertension: Secondary | ICD-10-CM

## 2019-02-05 DIAGNOSIS — E559 Vitamin D deficiency, unspecified: Secondary | ICD-10-CM

## 2019-02-05 NOTE — Telephone Encounter (Signed)
  Office: [phone]  /  Fax: [phone]  Date of Call: February 05, 2019  Time of Call: 10:15am Provider: Glennie Isle, PsyD  CONTENT: This provider called Radhika to check-in and to determine if she would like to schedule a follow-up appointment.  A HIPAA compliant voicemail was left requesting a call back.   PLAN: This provider will wait for Gwynn to call back. No further follow-up planned.

## 2019-04-09 ENCOUNTER — Other Ambulatory Visit: Payer: Self-pay | Admitting: Urology

## 2019-04-18 ENCOUNTER — Encounter (INDEPENDENT_AMBULATORY_CARE_PROVIDER_SITE_OTHER): Payer: Self-pay | Admitting: Bariatrics

## 2019-05-31 ENCOUNTER — Other Ambulatory Visit (HOSPITAL_COMMUNITY)
Admission: RE | Admit: 2019-05-31 | Discharge: 2019-05-31 | Disposition: A | Discharge status: Home / Self Care | Payer: 59 | Source: Ambulatory Visit | Attending: Urology | Admitting: Urology

## 2019-05-31 DIAGNOSIS — Z20828 Contact with and (suspected) exposure to other viral communicable diseases: Secondary | ICD-10-CM | POA: Diagnosis not present

## 2019-05-31 DIAGNOSIS — Z01812 Encounter for preprocedural laboratory examination: Secondary | ICD-10-CM | POA: Insufficient documentation

## 2019-05-31 NOTE — Patient Instructions (Addendum)
DUE TO COVID-19 ONLY ONE VISITOR IS ALLOWED TO COME WITH YOU AND STAY IN THE WAITING ROOM ONLY DURING PRE OP AND PROCEDURE DAY OF SURGERY. THE 1 VISITOR MAY VISIT WITH YOU AFTER SURGERY IN YOUR PRIVATE ROOM DURING VISITING HOURS ONLY!   ONCE YOUR COVID TEST IS COMPLETED, PLEASE BEGIN THE QUARANTINE INSTRUCTIONS AS OUTLINED IN YOUR HANDOUT.                Brandy Love    Your procedure is scheduled on: 06/04/19   Report to [facility] Main  Entrance   Report to Short Stay at 5:30  AM     Call this number if you have problems the morning of surgery [phone]    Remember: Do not eat food or drink liquids :After Midnight.   BRUSH YOUR TEETH MORNING OF SURGERY AND RINSE YOUR MOUTH OUT, NO CHEWING GUM CANDY OR MINTS.     Take these medicines the morning of surgery with A SIP OF WATER: Wellbutrin, Zoloft, Dexilant, Tolterodine,                         Use your inhalers and bring them with you to the hospital                                 You may not have any metal on your body including hair pins and              piercings  Do not wear jewelry, make-up, lotions, powders or perfumes, deodorant             Do not wear nail polish on your fingernails.  Do not shave  48 hours prior to surgery.           Do not bring valuables to the hospital. [name].  Contacts, dentures or bridgework may not be worn into surgery.       Special Instructions: N/A              Please read over the following fact sheets you were given: _____________________________________________________________________             [name] - Preparing for Surgery  Before surgery, you can play an important role.   Because skin is not sterile, your skin needs to be as free of germs as possible .  You can reduce the number of germs on your skin by washing with CHG (chlorahexidine gluconate) soap before surgery.   CHG is an antiseptic cleaner which  kills germs and bonds with the skin to continue killing germs even after washing. Please DO NOT use if you have an allergy to CHG or antibacterial soaps.   If your skin becomes reddened/irritated stop using the CHG and inform your nurse when you arrive at Short Stay. Do not shave (including legs and underarms) for at least 48 hours prior to the first CHG shower.    Please follow these instructions carefully:  1.  Shower with CHG Soap the night before surgery and the  morning of Surgery.  2.  If you choose to wash your hair, wash your hair first as usual with your  normal  shampoo.  3.  After you shampoo, rinse your hair and body thoroughly to remove the  shampoo.  4.  Use CHG as you would any other liquid soap.  You can apply chg directly  to the skin and wash                       Gently with a scrungie or clean washcloth.  5.  Apply the CHG Soap to your body ONLY FROM THE NECK DOWN.   Do not use on face/ open                           Wound or open sores. Avoid contact with eyes, ears mouth and genitals (private parts).                       Wash face,  Genitals (private parts) with your normal soap.             6.  Wash thoroughly, paying special attention to the area where your surgery  will be performed.  7.  Thoroughly rinse your body with warm water from the neck down.  8.  DO NOT shower/wash with your normal soap after using and rinsing off  the CHG Soap.             9.  Pat yourself dry with a clean towel.            10.  Wear clean pajamas.            11.  Place clean sheets on your bed the night of your first shower and do not  sleep with pets. Day of Surgery : Do not apply any lotions/deodorants the morning of surgery.  Please wear clean clothes to the hospital/surgery center.  FAILURE TO FOLLOW THESE INSTRUCTIONS MAY RESULT IN THE CANCELLATION OF YOUR SURGERY PATIENT SIGNATURE_________________________________  NURSE  SIGNATURE__________________________________  ________________________________________________________________________

## 2019-06-02 LAB — NOVEL CORONAVIRUS, NAA (HOSP ORDER, SEND-OUT TO REF LAB; TAT 18-24 HRS): SARS-CoV-2, NAA: NOT DETECTED

## 2019-06-02 NOTE — H&P (Signed)
On April 24 the patient had replacement of a malfunctioning InterStim. She has complicated voiding dysfunction and prolapse. We were sorting out whether not she would need a sling in combination with prolapse surgery. Only the IPG was changed. Prior to the change of the IPG she had stress incontinence urge incontinence and high-volume bedwetting. She had a large grade 2 or small grade 3 cystocele with moderate central defect. She had a large capacity vagina. She also had a rectocele and loss of support of the vaginal cuff. I thought she would best be served by transvaginal cystocele repair and likely a vault suspension and graft. She would be consented for rectocele repair had not think she would likely need 1. I was concerned about her postvoid residuals being elevated at the time of the urodynamics before and after   Patient thinks she is 50% improved. She still has bedwetting. She still leaks a varying amount with coughing and sneezing. Incision looks good   She will turn up the devices amplitude. Then she will try settings. If she is not sure had a program and she will let us know. I will re-evaluate her in about 5 weeks. We will then talk about prolapse surgery and likely or possibly a sling. She thinks she is 50% better   Today  Frequency is stable.  Patient still leaks with coughing sneezing pending high volume. Still has high-volume bedwetting. Still has urge incontinence. She admits she is not taking her Toldt tear dating very compliant Lea and will try to. She still feels vaginal bulging   Picture drawn. We talked about watchful waiting versus a pessary. She is sexually active. We talked about transvaginal vault suspension cystocele repair and graft and possible rectocele repair. We talked about watchful waiting versus sling. My concern is her mildly to moderately elevated residuals and persistent overactive bladder. She does have a complex presentation. Mesh issues described   She would like  to work with the [employer] representative and try some more programs and get some more teaching and will ranges. She go back on her Detrol. I will send urine for culture. She may proceed with surgery in the future. We will try to maximize the benefit of the InterStim 1st.   Patient had a residual today of 9 mL which is very promising     ALLERGIES: Erythromycin Derivatives Iodine Shellfish-derived Products     MEDICATIONS: Percocet 5 mg-325 mg tablet 1-2 tablet PO Q 8 H PRN  Albuterol powder Does Not Apply  Dexilant 60 mg capsule, delayed release, biphasic Oral  Latanoprost 0.005 % drops Ophthalmic  Losartan Potassium 100 mg tablet  Oxybutynin Chloride Er 15 mg tablet, extended release 24 hr 1 tablet PO Daily  Qvar 80 mcg aerosol with adapter Inhalation  Sertraline Hcl 100 mg tablet Oral  Sucralfate  Tab-A-Vite With Iron tablet Oral  Xopenex Hfa 45 mcg/actuation hfa aerosol with adapter Inhalation     GU PSH: Complex cystometrogram, w/ void pressure and urethral pressure profile studies, any technique - 11/19/2018 Complex Uroflow - 11/19/2018 Emg surf Electrd - 11/19/2018 Hysterectomy Unilat SO - 2013 Inject For cystogram - 11/19/2018 Interstim Stage 2 Generator - 2013 Interstim Stage One Neurostimulator - 2013 Intrabd voidng Press - 11/19/2018 Revise/remove Neuroreceiver - 02/01/2019       PSH Notes: Install Sacral Nerve Neurostimulator By Incision, Peripheral Neurostimulator Placement Of Pulse Generator, Electronic Analysis Complex Brain/Spinal Neurostim 1st Hour, Ankle Surgery, Hysterectomy, Appendectomy, Cesarean Section   NON-GU PSH: Appendectomy - 2013 Cesarean Delivery  Only - 2013     GU PMH: Gross hematuria - 06/16/2017 Chronic cystitis (w/o hematuria), Chronic cystitis - 2017 Urinary Frequency, Increased urinary frequency - 2017 Mixed incontinence, Urge and stress incontinence - 2016 Urinary Urgency, Urinary urgency - 2016 Cystocele, midline, Cystocele, midline -  2014 Nocturia, Nocturia - 2014 Overactive bladder, Hyperactivity Of The Bladder - 2014 Urinary Tract Inf, Unspec site, Urinary tract infection - 2014 Weak Urinary Stream, Weak urinary stream - 2014    NON-GU PMH: Encounter for general adult medical examination without abnormal findings, Encounter for preventive health examination - 2015 Asthma, Asthma - 2014 Muscle weakness (generalized), Muscle weakness - 2014 Other muscle spasm, Muscle spasm - 2014 Other specified anxiety disorders, Depression With Anxiety - 2014 Personal history of other diseases of the digestive system, History of esophageal reflux - 2014 Personal history of other diseases of the nervous system and sense organs, History of glaucoma - 2014 Personal history of other diseases of the respiratory system, History of allergic rhinitis - 2014 Personal history of other endocrine, nutritional and metabolic disease, History of hyperlipidemia - 2014, History of obesity, - 2014 Personal history of other specified conditions, History of edema - 2014    FAMILY HISTORY: Asthma - Mother Cataract - Father Diabetes - Mother Heart Disease - Father Prostate Cancer - Father   SOCIAL HISTORY: None    Notes: Former smoker, Activities Of Daily Living, Self-reliant In Usual Daily Activities, Living Independently With Spouse, Exercise Habits, Tobacco Use, Occupation:, Marital History - Currently Married   REVIEW OF SYSTEMS:    GU Review Female:   Patient reports leakage of urine. Patient denies frequent urination, hard to postpone urination, burning /pain with urination, get up at night to urinate, stream starts and stops, trouble starting your stream, have to strain to urinate, and being pregnant.  Gastrointestinal (Upper):   Patient denies nausea, vomiting, and indigestion/ heartburn.  Gastrointestinal (Lower):   Patient denies diarrhea and constipation.  Constitutional:   Patient denies fever, night sweats, weight loss, and fatigue.   Skin:   Patient denies skin rash/ lesion and itching.  Eyes:   Patient denies blurred vision and double vision.  Ears/ Nose/ Throat:   Patient denies sore throat and sinus problems.  Hematologic/Lymphatic:   Patient denies swollen glands and easy bruising.  Cardiovascular:   Patient denies leg swelling and chest pains.  Respiratory:   Patient denies cough and shortness of breath.  Endocrine:   Patient denies excessive thirst.  Musculoskeletal:   Patient denies back pain and joint pain.  Neurological:   Patient denies headaches and dizziness.  Psychologic:   Patient denies depression and anxiety.   VITAL SIGNS:      03/22/2019 09:15 AM  Weight 267.6 lb / 121.38 kg  Height 67 in / 170.18 cm  BP 115/72 mmHg  Pulse 80 /min  Temperature 97.1 F / 36.1 C  BMI 41.9 kg/m   PAST DATA REVIEWED:  Source Of History:  Patient   PROCEDURES:         PVR Ultrasound - [id number]  Scanned Volume: 9 cc         Urinalysis Dipstick Dipstick Cont'd  Color: Yellow Bilirubin: Neg mg/dL  Appearance: Clear Ketones: Neg mg/dL  Specific Gravity: 1.015 Blood: Neg ery/uL  pH: 6.5 Protein: Neg mg/dL  Glucose: Neg mg/dL Urobilinogen: 0.2 mg/dL    Nitrites: Neg    Leukocyte Esterase: Neg leu/uL    ASSESSMENT:      ICD-10 Details  1 GU:  Cystocele, midline - N81.11   2   Mixed incontinence - N39.46      PLAN:           Orders Labs CULTURE, URINE   After a thorough review of the management options for the patient's condition the patient  elected to proceed with surgical therapy as noted above. We have discussed the potential benefits and risks of the procedure, side effects of the proposed treatment, the likelihood of the patient achieving the goals of the procedure, and any potential problems that might occur during the procedure or recuperation. Informed consent has been obtained.

## 2019-06-03 ENCOUNTER — Other Ambulatory Visit: Payer: Self-pay

## 2019-06-03 ENCOUNTER — Encounter (HOSPITAL_COMMUNITY): Payer: Self-pay

## 2019-06-03 ENCOUNTER — Encounter (HOSPITAL_COMMUNITY)
Admission: RE | Admit: 2019-06-03 | Discharge: 2019-06-03 | Disposition: A | Discharge status: Home / Self Care | Payer: 59 | Source: Ambulatory Visit | Attending: Urology | Admitting: Urology

## 2019-06-03 DIAGNOSIS — R9431 Abnormal electrocardiogram [ECG] [EKG]: Secondary | ICD-10-CM | POA: Insufficient documentation

## 2019-06-03 DIAGNOSIS — N814 Uterovaginal prolapse, unspecified: Secondary | ICD-10-CM | POA: Diagnosis not present

## 2019-06-03 DIAGNOSIS — Z01818 Encounter for other preprocedural examination: Secondary | ICD-10-CM | POA: Diagnosis not present

## 2019-06-03 DIAGNOSIS — E119 Type 2 diabetes mellitus without complications: Secondary | ICD-10-CM | POA: Insufficient documentation

## 2019-06-03 HISTORY — DX: Prediabetes: R73.03

## 2019-06-03 LAB — CBC
HCT: 39.4 % (ref 36.0–46.0)
Hemoglobin: 12.3 g/dL (ref 12.0–15.0)
MCH: 25.9 pg — ABNORMAL LOW (ref 26.0–34.0)
MCHC: 31.2 g/dL (ref 30.0–36.0)
MCV: 83.1 fL (ref 80.0–100.0)
Platelets: 228 10*3/uL (ref 150–400)
RBC: 4.74 MIL/uL (ref 3.87–5.11)
RDW: 15.4 % (ref 11.5–15.5)
WBC: 5.8 10*3/uL (ref 4.0–10.5)
nRBC: 0 % (ref 0.0–0.2)

## 2019-06-03 LAB — PROTIME-INR
INR: 1 (ref 0.8–1.2)
Prothrombin Time: 13.4 seconds (ref 11.4–15.2)

## 2019-06-03 LAB — BASIC METABOLIC PANEL
Anion gap: 9 (ref 5–15)
BUN: 18 mg/dL (ref 8–23)
CO2: 28 mmol/L (ref 22–32)
Calcium: 9.2 mg/dL (ref 8.9–10.3)
Chloride: 102 mmol/L (ref 98–111)
Creatinine, Ser: 0.96 mg/dL (ref 0.44–1.00)
GFR calc Af Amer: >60 mL/min (ref >60)
GFR calc non Af Amer: >60 mL/min (ref >60)
Glucose, Bld: 132 mg/dL — ABNORMAL HIGH (ref 70–99)
Potassium: 3.8 mmol/L (ref 3.5–5.1)
Sodium: 139 mmol/L (ref 135–145)

## 2019-06-03 LAB — HEMOGLOBIN A1C
Hgb A1c MFr Bld: 6.1 % — ABNORMAL HIGH (ref 4.8–5.6)
Mean Plasma Glucose: 128.37 mg/dL

## 2019-06-03 NOTE — Progress Notes (Signed)
PCP - Dr. Earney Mallet Cardiologist - none  Chest x-ray - no EKG - 06/03/19 Stress Test - no ECHO - no Cardiac Cath - no  Sleep Study - NA CPAP -   Fasting Blood Sugar - unknown  Pt pre-diabetic Checks Blood Sugar _0____ times a day  Blood Thinner Instructions:NA Aspirin Instructions: Last Dose:  Anesthesia review:   Patient denies shortness of breath, fever, cough and chest pain at PAT appointment yes  Patient verbalized understanding of instructions that were given to them at the PAT appointment. Patient was also instructed that they will need to review over the PAT instructions again at home before surgery. yes

## 2019-06-03 NOTE — Anesthesia Preprocedure Evaluation (Addendum)
Anesthesia Evaluation  Patient identified by MRN, date of birth, ID band Patient awake    Reviewed: Allergy & Precautions, NPO status , Patient's Chart, lab work & pertinent test results  History of Anesthesia Complications Negative for: history of anesthetic complications  Airway Mallampati: II  TM Distance: >3 FB Neck ROM: Full    Dental  (+) Dental Advisory Given, Teeth Intact   Pulmonary asthma ,    Pulmonary exam normal        Cardiovascular hypertension, Pt. on medications (-) anginaNormal cardiovascular exam     Neuro/Psych negative neurological ROS  negative psych ROS   GI/Hepatic Neg liver ROS, PUD, GERD  Medicated and Controlled,  Endo/Other  Morbid obesity Pre-DM   Renal/GU negative Renal ROS Bladder dysfunction      Musculoskeletal negative musculoskeletal ROS (+)   Abdominal   Peds  Hematology negative hematology ROS (+)   Anesthesia Other Findings Covid negative 11/20   Reproductive/Obstetrics                            Anesthesia Physical Anesthesia Plan  ASA: III  Anesthesia Plan: General   Post-op Pain Management:    Induction: Intravenous  PONV Risk Score and Plan: 4 or greater and Treatment may vary due to age or medical condition, Ondansetron, Scopolamine patch - Pre-op, Dexamethasone and Midazolam  Airway Management Planned: Oral ETT  Additional Equipment: None  Intra-op Plan:   Post-operative Plan: Extubation in OR  Informed Consent: I have reviewed the patients History and Physical, chart, labs and discussed the procedure including the risks, benefits and alternatives for the proposed anesthesia with the patient or authorized representative who has indicated his/her understanding and acceptance.     Dental advisory given  Plan Discussed with: CRNA and Anesthesiologist  Anesthesia Plan Comments:        Anesthesia Quick Evaluation

## 2019-06-04 ENCOUNTER — Observation Stay (HOSPITAL_COMMUNITY)
Admission: RE | Admit: 2019-06-04 | Discharge: 2019-06-05 | Disposition: A | Discharge status: Home / Self Care | Payer: 59 | Attending: Urology | Admitting: Urology

## 2019-06-04 ENCOUNTER — Encounter (HOSPITAL_COMMUNITY): Payer: Self-pay

## 2019-06-04 ENCOUNTER — Ambulatory Visit (HOSPITAL_COMMUNITY): Payer: 59 | Admitting: Anesthesiology

## 2019-06-04 ENCOUNTER — Encounter (HOSPITAL_COMMUNITY)
Admission: RE | Disposition: A | Discharge status: Home / Self Care | Payer: Self-pay | Source: Home / Self Care | Attending: Urology

## 2019-06-04 ENCOUNTER — Ambulatory Visit (HOSPITAL_COMMUNITY): Payer: 59 | Admitting: Physician Assistant

## 2019-06-04 DIAGNOSIS — K219 Gastro-esophageal reflux disease without esophagitis: Secondary | ICD-10-CM | POA: Insufficient documentation

## 2019-06-04 DIAGNOSIS — Z87891 Personal history of nicotine dependence: Secondary | ICD-10-CM | POA: Diagnosis not present

## 2019-06-04 DIAGNOSIS — E785 Hyperlipidemia, unspecified: Secondary | ICD-10-CM | POA: Insufficient documentation

## 2019-06-04 DIAGNOSIS — F418 Other specified anxiety disorders: Secondary | ICD-10-CM | POA: Diagnosis not present

## 2019-06-04 DIAGNOSIS — H409 Unspecified glaucoma: Secondary | ICD-10-CM | POA: Diagnosis not present

## 2019-06-04 DIAGNOSIS — N8111 Cystocele, midline: Principal | ICD-10-CM | POA: Insufficient documentation

## 2019-06-04 DIAGNOSIS — Z6841 Body Mass Index (BMI) 40.0 and over, adult: Secondary | ICD-10-CM | POA: Diagnosis not present

## 2019-06-04 DIAGNOSIS — Z7951 Long term (current) use of inhaled steroids: Secondary | ICD-10-CM | POA: Diagnosis not present

## 2019-06-04 DIAGNOSIS — J45909 Unspecified asthma, uncomplicated: Secondary | ICD-10-CM | POA: Insufficient documentation

## 2019-06-04 DIAGNOSIS — N816 Rectocele: Secondary | ICD-10-CM | POA: Diagnosis not present

## 2019-06-04 DIAGNOSIS — I10 Essential (primary) hypertension: Secondary | ICD-10-CM | POA: Insufficient documentation

## 2019-06-04 DIAGNOSIS — N814 Uterovaginal prolapse, unspecified: Secondary | ICD-10-CM | POA: Diagnosis present

## 2019-06-04 DIAGNOSIS — N3946 Mixed incontinence: Secondary | ICD-10-CM | POA: Diagnosis not present

## 2019-06-04 DIAGNOSIS — Z79899 Other long term (current) drug therapy: Secondary | ICD-10-CM | POA: Insufficient documentation

## 2019-06-04 HISTORY — PX: ANTERIOR AND POSTERIOR REPAIR: SHX5121

## 2019-06-04 HISTORY — PX: PUBOVAGINAL SLING: SHX1035

## 2019-06-04 LAB — GLUCOSE, CAPILLARY: Glucose-Capillary: 118 mg/dL — ABNORMAL HIGH (ref 70–99)

## 2019-06-04 LAB — HEMOGLOBIN AND HEMATOCRIT, BLOOD
HCT: 38.3 % (ref 36.0–46.0)
Hemoglobin: 11.9 g/dL — ABNORMAL LOW (ref 12.0–15.0)

## 2019-06-04 SURGERY — ANTERIOR (CYSTOCELE) AND POSTERIOR REPAIR (RECTOCELE)
Anesthesia: General

## 2019-06-04 MED ORDER — LIDOCAINE HCL (CARDIAC) PF 100 MG/5ML IV SOSY
PREFILLED_SYRINGE | INTRAVENOUS | Status: DC | PRN
  Administered 2019-06-04: 80 mg via INTRAVENOUS

## 2019-06-04 MED ORDER — DEXTROSE-NACL 5-0.45 % IV SOLN
INTRAVENOUS | Status: DC
  Administered 2019-06-04 (×2): via INTRAVENOUS

## 2019-06-04 MED ORDER — CLINDAMYCIN PHOSPHATE 2 % VA CREA
TOPICAL_CREAM | VAGINAL | Status: AC
  Filled 2019-06-04: qty 80

## 2019-06-04 MED ORDER — SUGAMMADEX SODIUM 500 MG/5ML IV SOLN
INTRAVENOUS | Status: DC | PRN
  Administered 2019-06-04: 250 mg via INTRAVENOUS

## 2019-06-04 MED ORDER — DIPHENHYDRAMINE HCL 50 MG/ML IJ SOLN: 12.5000 mg | Freq: Four times a day (QID) | INTRAMUSCULAR | Status: DC | PRN

## 2019-06-04 MED ORDER — SCOPOLAMINE 1 MG/3DAYS TD PT72
MEDICATED_PATCH | TRANSDERMAL | Status: AC
  Filled 2019-06-04: qty 1

## 2019-06-04 MED ORDER — LOSARTAN POTASSIUM 50 MG PO TABS
100.0000 mg | ORAL_TABLET | Freq: Every day | ORAL | Status: DC
  Administered 2019-06-04 – 2019-06-05 (×2): 100 mg via ORAL
  Filled 2019-06-04 (×2): qty 2

## 2019-06-04 MED ORDER — PANTOPRAZOLE SODIUM 40 MG PO TBEC
40.0000 mg | DELAYED_RELEASE_TABLET | Freq: Every day | ORAL | Status: DC
  Filled 2019-06-04: qty 1

## 2019-06-04 MED ORDER — ONDANSETRON HCL 4 MG/2ML IJ SOLN
INTRAMUSCULAR | Status: DC | PRN
  Administered 2019-06-04: 4 mg via INTRAVENOUS

## 2019-06-04 MED ORDER — SCOPOLAMINE 1 MG/3DAYS TD PT72
MEDICATED_PATCH | TRANSDERMAL | Status: DC | PRN
  Administered 2019-06-04: 1 via TRANSDERMAL

## 2019-06-04 MED ORDER — ROCURONIUM BROMIDE 10 MG/ML (PF) SYRINGE
PREFILLED_SYRINGE | INTRAVENOUS | Status: AC
  Filled 2019-06-04: qty 10

## 2019-06-04 MED ORDER — MORPHINE SULFATE (PF) 4 MG/ML IV SOLN: 2.0000 mg | INTRAVENOUS | Status: DC | PRN

## 2019-06-04 MED ORDER — DEXAMETHASONE SODIUM PHOSPHATE 10 MG/ML IJ SOLN
INTRAMUSCULAR | Status: DC | PRN
  Administered 2019-06-04: 10 mg via INTRAVENOUS

## 2019-06-04 MED ORDER — CHLORHEXIDINE GLUCONATE CLOTH 2 % EX PADS
6.0000 | MEDICATED_PAD | Freq: Every day | CUTANEOUS | Status: DC
  Administered 2019-06-04 – 2019-06-05 (×2): 6 via TOPICAL

## 2019-06-04 MED ORDER — SERTRALINE HCL 100 MG PO TABS
200.0000 mg | ORAL_TABLET | Freq: Every day | ORAL | Status: DC
  Administered 2019-06-04 – 2019-06-05 (×2): 200 mg via ORAL
  Filled 2019-06-04 (×2): qty 2

## 2019-06-04 MED ORDER — STERILE WATER FOR IRRIGATION IR SOLN
Status: DC | PRN
  Administered 2019-06-04: 3000 mL via INTRAVESICAL

## 2019-06-04 MED ORDER — ROCURONIUM BROMIDE 100 MG/10ML IV SOLN
INTRAVENOUS | Status: DC | PRN
  Administered 2019-06-04: 20 mg via INTRAVENOUS
  Administered 2019-06-04: 80 mg via INTRAVENOUS
  Administered 2019-06-04: 10 mg via INTRAVENOUS

## 2019-06-04 MED ORDER — OXYBUTYNIN CHLORIDE 5 MG PO TABS: 5.0000 mg | ORAL_TABLET | Freq: Three times a day (TID) | ORAL | Status: DC | PRN

## 2019-06-04 MED ORDER — ONDANSETRON HCL 4 MG/2ML IJ SOLN
INTRAMUSCULAR | Status: AC
  Filled 2019-06-04: qty 2

## 2019-06-04 MED ORDER — OXYCODONE HCL 5 MG/5ML PO SOLN: 5.0000 mg | Freq: Once | ORAL | Status: DC | PRN

## 2019-06-04 MED ORDER — HYDROCODONE-ACETAMINOPHEN 5-325 MG PO TABS
ORAL_TABLET | ORAL | Status: AC
  Filled 2019-06-04: qty 1

## 2019-06-04 MED ORDER — BUDESONIDE 0.25 MG/2ML IN SUSP
0.2500 mg | Freq: Two times a day (BID) | RESPIRATORY_TRACT | Status: DC
  Filled 2019-06-04 (×2): qty 2

## 2019-06-04 MED ORDER — PHENAZOPYRIDINE HCL 200 MG PO TABS
200.0000 mg | ORAL_TABLET | ORAL | Status: AC
  Administered 2019-06-04: 200 mg via ORAL
  Filled 2019-06-04: qty 1

## 2019-06-04 MED ORDER — ONDANSETRON HCL 4 MG/2ML IJ SOLN: 4.0000 mg | INTRAMUSCULAR | Status: DC | PRN

## 2019-06-04 MED ORDER — MIDAZOLAM HCL 2 MG/2ML IJ SOLN
INTRAMUSCULAR | Status: AC
  Filled 2019-06-04: qty 2

## 2019-06-04 MED ORDER — FENTANYL CITRATE (PF) 250 MCG/5ML IJ SOLN
INTRAMUSCULAR | Status: DC | PRN
  Administered 2019-06-04: 100 ug via INTRAVENOUS
  Administered 2019-06-04: 50 ug via INTRAVENOUS

## 2019-06-04 MED ORDER — FENTANYL CITRATE (PF) 250 MCG/5ML IJ SOLN
INTRAMUSCULAR | Status: AC
  Filled 2019-06-04: qty 5

## 2019-06-04 MED ORDER — CLINDAMYCIN PHOSPHATE 2 % VA CREA
TOPICAL_CREAM | VAGINAL | Status: DC | PRN
  Administered 2019-06-04: 2 via VAGINAL

## 2019-06-04 MED ORDER — HYDROCODONE-ACETAMINOPHEN 5-325 MG PO TABS: 1.0000 | ORAL_TABLET | Freq: Four times a day (QID) | ORAL | 0 refills | Status: DC | PRN

## 2019-06-04 MED ORDER — GENTAMICIN SULFATE 40 MG/ML IJ SOLN
5.0000 mg/kg | INTRAVENOUS | Status: AC
  Administered 2019-06-04: 420 mg via INTRAVENOUS
  Filled 2019-06-04: qty 10.5

## 2019-06-04 MED ORDER — SODIUM CHLORIDE 0.9 % IV SOLN
INTRAVENOUS | Status: AC
  Filled 2019-06-04: qty 500000

## 2019-06-04 MED ORDER — DOCUSATE SODIUM 100 MG PO CAPS
100.0000 mg | ORAL_CAPSULE | Freq: Two times a day (BID) | ORAL | Status: DC
  Administered 2019-06-04 – 2019-06-05 (×3): 100 mg via ORAL
  Filled 2019-06-04 (×4): qty 1

## 2019-06-04 MED ORDER — BUPROPION HCL ER (XL) 150 MG PO TB24
150.0000 mg | ORAL_TABLET | Freq: Every day | ORAL | Status: DC
  Administered 2019-06-04 – 2019-06-05 (×2): 150 mg via ORAL
  Filled 2019-06-04 (×2): qty 1

## 2019-06-04 MED ORDER — HYDROCODONE-ACETAMINOPHEN 5-325 MG PO TABS
1.0000 | ORAL_TABLET | ORAL | Status: DC | PRN
  Administered 2019-06-04: 1 via ORAL
  Administered 2019-06-04 – 2019-06-05 (×2): 2 via ORAL
  Filled 2019-06-04 (×2): qty 2

## 2019-06-04 MED ORDER — CLINDAMYCIN PHOSPHATE 600 MG/50ML IV SOLN
600.0000 mg | INTRAVENOUS | Status: AC
  Administered 2019-06-04: 600 mg via INTRAVENOUS
  Filled 2019-06-04: qty 50

## 2019-06-04 MED ORDER — FENTANYL CITRATE (PF) 100 MCG/2ML IJ SOLN
25.0000 ug | INTRAMUSCULAR | Status: DC | PRN
  Administered 2019-06-04 (×2): 50 ug via INTRAVENOUS

## 2019-06-04 MED ORDER — DIPHENHYDRAMINE HCL 12.5 MG/5ML PO ELIX
12.5000 mg | ORAL_SOLUTION | Freq: Four times a day (QID) | ORAL | Status: DC | PRN
  Filled 2019-06-04: qty 10

## 2019-06-04 MED ORDER — DOCUSATE SODIUM 100 MG PO CAPS: 100.0000 mg | ORAL_CAPSULE | Freq: Two times a day (BID) | ORAL | Status: DC

## 2019-06-04 MED ORDER — PROPOFOL 10 MG/ML IV BOLUS
INTRAVENOUS | Status: DC | PRN
  Administered 2019-06-04: 200 mg via INTRAVENOUS

## 2019-06-04 MED ORDER — ACETAMINOPHEN 325 MG PO TABS: 650.0000 mg | ORAL_TABLET | ORAL | Status: DC | PRN

## 2019-06-04 MED ORDER — ONDANSETRON HCL 4 MG/2ML IJ SOLN: 4.0000 mg | Freq: Once | INTRAMUSCULAR | Status: DC | PRN

## 2019-06-04 MED ORDER — MIDAZOLAM HCL 5 MG/5ML IJ SOLN
INTRAMUSCULAR | Status: DC | PRN
  Administered 2019-06-04: 2 mg via INTRAVENOUS

## 2019-06-04 MED ORDER — ALBUTEROL SULFATE (2.5 MG/3ML) 0.083% IN NEBU: 2.5000 mg | INHALATION_SOLUTION | Freq: Four times a day (QID) | RESPIRATORY_TRACT | Status: DC | PRN

## 2019-06-04 MED ORDER — PROPOFOL 10 MG/ML IV BOLUS
INTRAVENOUS | Status: AC
  Filled 2019-06-04: qty 20

## 2019-06-04 MED ORDER — SODIUM CHLORIDE 0.9 % IV SOLN
INTRAVENOUS | Status: DC | PRN
  Administered 2019-06-04: 500 mL

## 2019-06-04 MED ORDER — DEXLANSOPRAZOLE 60 MG PO CPDR
60.0000 mg | DELAYED_RELEASE_CAPSULE | Freq: Every day | ORAL | Status: DC
  Administered 2019-06-04 – 2019-06-05 (×2): 60 mg via ORAL
  Filled 2019-06-04 (×3): qty 1

## 2019-06-04 MED ORDER — FENTANYL CITRATE (PF) 100 MCG/2ML IJ SOLN
INTRAMUSCULAR | Status: AC
  Filled 2019-06-04: qty 2

## 2019-06-04 MED ORDER — LIDOCAINE-EPINEPHRINE (PF) 1 %-1:200000 IJ SOLN
INTRAMUSCULAR | Status: DC | PRN
  Administered 2019-06-04: 30 mL

## 2019-06-04 MED ORDER — LACTATED RINGERS IV SOLN
INTRAVENOUS | Status: DC
  Administered 2019-06-04 (×2): via INTRAVENOUS

## 2019-06-04 MED ORDER — LIDOCAINE-EPINEPHRINE (PF) 1 %-1:200000 IJ SOLN
INTRAMUSCULAR | Status: AC
  Filled 2019-06-04: qty 30

## 2019-06-04 MED ORDER — SUGAMMADEX SODIUM 500 MG/5ML IV SOLN
INTRAVENOUS | Status: AC
  Filled 2019-06-04: qty 5

## 2019-06-04 MED ORDER — OXYCODONE HCL 5 MG PO TABS: 5.0000 mg | ORAL_TABLET | Freq: Once | ORAL | Status: DC | PRN

## 2019-06-04 SURGICAL SUPPLY — 72 items
BACTOSHIELD CHG 4% 4OZ (MISCELLANEOUS) ×1
BAG DECANTER FOR FLEXI CONT (MISCELLANEOUS) ×2 IMPLANT
BAG URINE DRAIN 2000ML AR STRL (UROLOGICAL SUPPLIES) ×2 IMPLANT
BLADE HEX COATED 2.75 (ELECTRODE) IMPLANT
BLADE SURG 15 STRL LF DISP TIS (BLADE) ×1 IMPLANT
BLADE SURG 15 STRL SS (BLADE) ×1
BRIEF STRETCH FOR OB PAD LRG (UNDERPADS AND DIAPERS) ×2 IMPLANT
CATH FOLEY 2WAY SLVR  5CC 14FR (CATHETERS) ×1
CATH FOLEY 2WAY SLVR 5CC 14FR (CATHETERS) ×1 IMPLANT
COVER MAYO STAND STRL (DRAPES) ×2 IMPLANT
COVER SURGICAL LIGHT HANDLE (MISCELLANEOUS) ×2 IMPLANT
COVER WAND RF STERILE (DRAPES) IMPLANT
DECANTER SPIKE VIAL GLASS SM (MISCELLANEOUS) ×2 IMPLANT
DERMABOND ADVANCED (GAUZE/BANDAGES/DRESSINGS) ×1
DERMABOND ADVANCED .7 DNX12 (GAUZE/BANDAGES/DRESSINGS) ×1 IMPLANT
DEVICE CAPIO SLIM SINGLE (INSTRUMENTS) IMPLANT
DRAIN PENROSE 18X1/4 LTX STRL (WOUND CARE) ×2 IMPLANT
DRAPE SHEET LG 3/4 BI-LAMINATE (DRAPES) IMPLANT
DRAPE UNDERBUTTOCKS STRL (DISPOSABLE) ×2 IMPLANT
DRSG TEGADERM 4X4.75 (GAUZE/BANDAGES/DRESSINGS) ×6 IMPLANT
ELECT PENCIL ROCKER SW 15FT (MISCELLANEOUS) ×2 IMPLANT
ELECT REM PT RETURN 9FT ADLT (ELECTROSURGICAL) ×2
ELECTRODE REM PT RTRN 9FT ADLT (ELECTROSURGICAL) ×1 IMPLANT
GAUZE 4X4 16PLY RFD (DISPOSABLE) ×8 IMPLANT
GAUZE PACKING 1 X5 YD ST (GAUZE/BANDAGES/DRESSINGS) ×4 IMPLANT
GAUZE PACKING 2X5 YD STRL (GAUZE/BANDAGES/DRESSINGS) IMPLANT
GLOVE BIO SURGEON STRL SZ 6.5 (GLOVE) ×2 IMPLANT
GLOVE BIOGEL M STRL SZ7.5 (GLOVE) ×6 IMPLANT
GLOVE ECLIPSE 8.0 STRL XLNG CF (GLOVE) ×4 IMPLANT
GLOVE ECLIPSE 8.5 STRL (GLOVE) ×2 IMPLANT
GOWN STRL REUS W/TWL XL LVL3 (GOWN DISPOSABLE) ×2 IMPLANT
HOLDER FOLEY CATH W/STRAP (MISCELLANEOUS) ×2 IMPLANT
IV NS 1000ML (IV SOLUTION)
IV NS 1000ML BAXH (IV SOLUTION) IMPLANT
KIT BASIN OR (CUSTOM PROCEDURE TRAY) ×2 IMPLANT
KIT TURNOVER KIT A (KITS) ×2 IMPLANT
NEEDLE HYPO 22GX1.5 SAFETY (NEEDLE) ×2 IMPLANT
NEEDLE MAYO 6 CRC TAPER PT (NEEDLE) ×2 IMPLANT
NS IRRIG 1000ML POUR BTL (IV SOLUTION) IMPLANT
PACK CYSTO (CUSTOM PROCEDURE TRAY) ×2 IMPLANT
PAD OB MATERNITY 4.3X12.25 (PERSONAL CARE ITEMS) ×2 IMPLANT
PAD POSITIONER PINK NONSTERILE (MISCELLANEOUS) ×2 IMPLANT
PAD POSITIONING PINK XL (MISCELLANEOUS) ×2 IMPLANT
PENCIL SMOKE EVACUATOR (MISCELLANEOUS) IMPLANT
PLUG CATH AND CAP STER (CATHETERS) ×2 IMPLANT
RETRACTOR STAY HOOK 5MM (MISCELLANEOUS) ×2 IMPLANT
SCRUB CHG 4% DYNA-HEX 4OZ (MISCELLANEOUS) ×1 IMPLANT
SET IRRIG Y TYPE TUR BLADDER L (SET/KITS/TRAYS/PACK) ×2 IMPLANT
SHEET LAVH (DRAPES) ×2 IMPLANT
SLING SUPRIS RETROPUBIC KIT (Miscellaneous) ×2 IMPLANT
SUT CAPIO ETHIBPND (SUTURE) IMPLANT
SUT CAPIO POLYGLYCOLIC (SUTURE) IMPLANT
SUT ETHIBOND 0 (SUTURE) IMPLANT
SUT SILK 2 0 SH (SUTURE) IMPLANT
SUT VIC AB 0 CT1 27 (SUTURE)
SUT VIC AB 0 CT1 27XBRD ANTBC (SUTURE) IMPLANT
SUT VIC AB 2-0 CT1 27 (SUTURE) ×2
SUT VIC AB 2-0 CT1 27XBRD (SUTURE) ×2 IMPLANT
SUT VIC AB 2-0 SH 27 (SUTURE) ×1
SUT VIC AB 2-0 SH 27X BRD (SUTURE) ×1 IMPLANT
SUT VIC AB 3-0 SH 27 (SUTURE)
SUT VIC AB 3-0 SH 27XBRD (SUTURE) IMPLANT
SUT VIC AB 4-0 PS2 27 (SUTURE) ×2 IMPLANT
SUT VICRYL 0 UR6 27IN ABS (SUTURE) IMPLANT
SYR 10ML LL (SYRINGE) ×2 IMPLANT
TOWEL OR 17X26 10 PK STRL BLUE (TOWEL DISPOSABLE) ×2 IMPLANT
TOWEL OR NON WOVEN STRL DISP B (DISPOSABLE) ×2 IMPLANT
TUBING CONNECTING 10 (TUBING) ×2 IMPLANT
UNDERPAD 30X36 HEAVY ABSORB (UNDERPADS AND DIAPERS) ×2 IMPLANT
WATER STERILE IRR 1000ML POUR (IV SOLUTION) IMPLANT
WATER STERILE IRR 500ML POUR (IV SOLUTION) ×2 IMPLANT
YANKAUER SUCT BULB TIP 10FT TU (MISCELLANEOUS) ×2 IMPLANT

## 2019-06-04 NOTE — Op Note (Signed)
Preoperative diagnosis: Cystocele, mild rectocele, mild vault prolapse and stress incontinence  Postoperative diagnosis: Midline cystocele, mild rectocele mild vault prolapse stress incontinence Surgery: Cystocele repair, sling cystourethropexy and cystoscopy Surgeon: Dr. Nicki Reaper Maxi Rodas Assistant: Debbrah Alar  Patient has the above diagnosis and consented to the above procedure.  Extra care was taken with leg positioning to minimize risk of compartment syndrome and neuropathy and deep vein thrombosis.  I carefully examined the vagina and she had a grade 2 cystocele and her cuff was very well supported.  She had a mild diffuse rectocele.  I double checked the cuff with traction and she did not need a vault suspension.  She actually had a narrow pubic arch and relative to her body habitus a narrow vaginal opening  I instilled 25 cc of lidocaine epinephrine mixture.  I used my Allis clamps and my T-shaped anterior vaginal wall incision.  It was a little bit more challenging based of the noted anatomical findings noted.  I sharply took the vaginal epithelium off the underlying pubocervical fascia to the white line bilaterally.  The tissue was very pale and needed more sharp dissection.  I was very thorough mobilizing at her apex to give her as much length as possible to fix the mild to moderate cystocele but otherwise would not have had enough length to adequately repair  I did an anterior repair with running 2-0 Vicryl suture.  I kept excellent length and anatomy.  I did not imbricate the bladder neck  I cystoscoped the patient.  Cystoscopically she had a very good repair.  No distortion of ureters.  Bilateral efflux normal  There is no question she doid not need a vault suspension and only trimmed a very small amount of anterior vaginal wall and closed the anterior vaginal wall with running 2-0 Vicryl on a CT1 needle.  I then did a digital rectal examination and again she did not need a rectocele  repair.  She has some diffuse weakness but not much elasticity.  She had good vaginal length.  I made two 1 cm incisions 1 fingerbreadth above the symphysis pubis 1.5 cm lateral to the midline.  I marked out a 2 cm suburethral incision.  I instilled 3 cc of lidocaine epinephrine.  I made an appropriate depth incision.  I dissected to the urethrovesical angle bilaterally.  With the bladder emptied I passed a trocar on top of and along the back the symphysis pubis on to the pulp of my inex finger bilaterally.  I used the box technique.  No urethral injury with trocar  Cystoscope the patient.  No injury to bladder or urethra.  I did not see indentation of trocar.  Excellent efflux bilaterally  With the bladder emptied I attached the mesh with the described technique and brought it up thru the retropubic space.  I was very happy with its tension and position.  She has a short urethra.  It had appropriate hypermobility and no spring back effect.  It was place re 2 mm on the looser side because of her significant urge incontinence and short urethra and any possibility of recurrent cystocele  I closed anterior vaginal wall with running 2-0 Vicryl followed by 2 interrupted sutures.  I cut the mesh below the skin and closed with abdominal incisions with 1 interrupted 4-0 Vicryl and Dermabond.  Vaginal pack with clindamycin cream applied.  Blood loss was less than 50 mL.  Leg position was excellent.  Hopefully the patient will reach her treatment goal.

## 2019-06-04 NOTE — Anesthesia Postprocedure Evaluation (Signed)
Anesthesia Post Note  Patient: GIAH CHUE  Procedure(s) Performed: CYSTOSCOPY ANTERIOR (CYSTOCELE) (N/A ) PUBO-VAGINAL SLING (N/A )     Patient location during evaluation: PACU Anesthesia Type: General Level of consciousness: awake and alert Pain management: pain level controlled Vital Signs Assessment: post-procedure vital signs reviewed and stable Respiratory status: spontaneous breathing, nonlabored ventilation, respiratory function stable and patient connected to nasal cannula oxygen Cardiovascular status: blood pressure returned to baseline and stable Postop Assessment: no apparent nausea or vomiting Anesthetic complications: no    Last Vitals:  Vitals:   06/04/19 1215 06/04/19 1230  BP: 121/63 132/72  Pulse: 82 87  Resp: 19 18  Temp:  36.6 C  SpO2: 95% 94%    Last Pain:  Vitals:   06/04/19 1215  TempSrc:   PainSc: 2                  Audry Pili

## 2019-06-04 NOTE — Transfer of Care (Signed)
Immediate Anesthesia Transfer of Care Note  Patient: Brandy Love  Procedure(s) Performed: CYSTOSCOPY ANTERIOR (CYSTOCELE) (N/A ) PUBO-VAGINAL SLING (N/A )  Patient Location: PACU  Anesthesia Type:General  Level of Consciousness: drowsy, patient cooperative and responds to stimulation  Airway & Oxygen Therapy: Patient Spontanous Breathing and Patient connected to face mask oxygen  Post-op Assessment: Report given to RN and Post -op Vital signs reviewed and stable  Post vital signs: Reviewed and stable  Last Vitals:  Vitals Value Taken Time  BP 141/74 06/04/19 1047  Temp    Pulse 86 06/04/19 1050  Resp 23 06/04/19 1050  SpO2 96 % 06/04/19 1050  Vitals shown include unvalidated device data.  Last Pain:  Vitals:   06/04/19 0618  TempSrc:   PainSc: 0-No pain         Complications: No apparent anesthesia complications

## 2019-06-04 NOTE — Interval H&P Note (Signed)
History and Physical Interval Note:  06/04/2019 7:04 AM  Brandy Love  has presented today for surgery, with the diagnosis of cystocele rectocele vault prolapse stress incontinence.  The various methods of treatment have been discussed with the patient and family. After consideration of risks, benefits and other options for treatment, the patient has consented to  Procedure(s): CYSTOSCOPY ANTERIOR (CYSTOCELE) AND POSTERIOR REPAIR (RECTOCELE) (N/A) VAGINAL VAULT PROLAPSE REPAIR and GRAFT (N/A) PUBO-VAGINAL SLING (N/A) as a surgical intervention.  The patient's history has been reviewed, patient examined, no change in status, stable for surgery.  I have reviewed the patient's chart and labs.  Questions were answered to the patient's satisfaction.     [name] A Axel Meas

## 2019-06-04 NOTE — Anesthesia Procedure Notes (Signed)
Procedure Name: Intubation Date/Time: 06/04/2019 7:44 AM Performed by: Glory Buff, CRNA Pre-anesthesia Checklist: Patient identified, Emergency Drugs available, Suction available and Patient being monitored Patient Re-evaluated:Patient Re-evaluated prior to induction Oxygen Delivery Method: Circle system utilized Preoxygenation: Pre-oxygenation with 100% oxygen Induction Type: IV induction Ventilation: Mask ventilation without difficulty Laryngoscope Size: Mac and 3 Grade View: Grade I Tube type: Oral Tube size: 7.0 mm Number of attempts: 1 Airway Equipment and Method: Stylet and Oral airway Placement Confirmation: ETT inserted through vocal cords under direct vision,  positive ETCO2 and breath sounds checked- equal and bilateral Secured at: 21 cm Tube secured with: Tape Dental Injury: Teeth and Oropharynx as per pre-operative assessment  Comments: Intubation performed by Berneice Gandy EMT.

## 2019-06-04 NOTE — Progress Notes (Signed)
Urology Progress Note   Day of Surgery from Cystocele repair, urethral sling and cystoscopy.   Subjective: Transferred to floor Pain controlled Taking CLD without nausea/emesis Overall feeling well  Objective: Vital signs in last 24 hours: Temp:  [97.3 F (36.3 C)-98.1 F (36.7 C)] 98.1 F (36.7 C) (11/24 1453) Pulse Rate:  [68-90] 90 (11/24 1453) Resp:  [16-32] 16 (11/24 1453) BP: (121-159)/(63-79) 159/78 (11/24 1453) SpO2:  [90 %-100 %] 94 % (11/24 1453) Weight:  [118.1 kg] 118.1 kg (11/24 0548)  Intake/Output from previous day: No intake/output data recorded. Intake/Output this shift: Total I/O In: 1883.1 [I.V.:1722.6; IV Piggyback:160.5] Out: 625 [Urine:600; Blood:25]  Physical Exam:  General: Alert and oriented CV: Regular rate Lungs: No increased work of breathing Abdomen:  Soft, appropriately tender.  GU: Foley in place draining clear yellow urine. VP in place  Ext: NT, No erythema  Lab Results: Recent Labs    06/03/19 1052 06/04/19 1609  HGB 12.3 11.9*  HCT 39.4 38.3   Recent Labs    06/03/19 1052  NA 139  K 3.8  CL 102  CO2 28  GLUCOSE 132*  BUN 18  CREATININE 0.96  CALCIUM 9.2    Studies/Results: No results found.  Assessment/Plan:  63 y.o. female s/p  Cystocele repair, urethral sling and cystoscopy.  Overall doing well immediately post-op.   - Floor status - Multimodal analgesia - CLD, ADAT - Gentle mIVF - AM Labs - Continue Foley to drainage - Continue vaginal packing - Anticipate remove vaginal packing and foley in the AM  Dispo: Pending; anticipate tomorrow   LOS: 0 days

## 2019-06-05 ENCOUNTER — Encounter (HOSPITAL_COMMUNITY): Payer: Self-pay | Admitting: Urology

## 2019-06-05 DIAGNOSIS — N8111 Cystocele, midline: Secondary | ICD-10-CM | POA: Diagnosis not present

## 2019-06-05 LAB — BASIC METABOLIC PANEL
Anion gap: 7 (ref 5–15)
BUN: 9 mg/dL (ref 8–23)
CO2: 29 mmol/L (ref 22–32)
Calcium: 8.5 mg/dL — ABNORMAL LOW (ref 8.9–10.3)
Chloride: 104 mmol/L (ref 98–111)
Creatinine, Ser: 0.7 mg/dL (ref 0.44–1.00)
GFR calc Af Amer: >60 mL/min (ref >60)
GFR calc non Af Amer: >60 mL/min (ref >60)
Glucose, Bld: 130 mg/dL — ABNORMAL HIGH (ref 70–99)
Potassium: 3.7 mmol/L (ref 3.5–5.1)
Sodium: 140 mmol/L (ref 135–145)

## 2019-06-05 LAB — HEMOGLOBIN AND HEMATOCRIT, BLOOD
HCT: 36 % (ref 36.0–46.0)
Hemoglobin: 10.9 g/dL — ABNORMAL LOW (ref 12.0–15.0)

## 2019-06-05 NOTE — Plan of Care (Signed)
  Problem: Education: Goal: Knowledge of General Education information will improve Description: Including pain rating scale, medication(s)/side effects and non-pharmacologic comfort measures Outcome: Completed/Met   Problem: Clinical Measurements: Goal: Respiratory complications will improve Outcome: Completed/Met   Problem: Activity: Goal: Risk for activity intolerance will decrease Outcome: Completed/Met   Problem: Coping: Goal: Level of anxiety will decrease Outcome: Completed/Met   Problem: Elimination: Goal: Will not experience complications related to bowel motility Outcome: Completed/Met   Problem: Pain Managment: Goal: General experience of comfort will improve Outcome: Completed/Met

## 2019-06-05 NOTE — Discharge Instructions (Signed)
I have reviewed discharge instructions in detail with the patient. They will follow-up with me or their physician as scheduled. My nurse will also be calling the patients as per protocol. As discussed with Dr. Dacey Milberger. ° °You may resume aspirin, advil, aleve, vitamins, and supplements 7 days after surgery. °

## 2019-06-05 NOTE — Progress Notes (Signed)
Vitals normal No pain Labs normal Void trial

## 2019-06-05 NOTE — Progress Notes (Signed)
Dr. Matilde Sprang notified of PVR by bladder scan at 63ml.  Per Dr. Matilde Sprang pt may dc to home at this time. Stacey Drain

## 2019-06-05 NOTE — Discharge Summary (Signed)
Date of admission: 06/04/2019  Date of discharge: 06/05/2019  Admission diagnosis: cystocele midline  Discharge diagnosis: cystocele midline  Secondary diagnoses: stress incontinence  History and Physical: For full details, please see admission history and physical. Briefly, Brandy Love is a 63 y.o. year old patient with above diagnosis.   Hospital Course: cystocele repair and sling and cystoscopy; good post op course  Laboratory values:  Recent Labs    06/03/19 1052 06/04/19 1609 06/05/19 0525  HGB 12.3 11.9* 10.9*  HCT 39.4 38.3 36.0   Recent Labs    06/03/19 1052 06/05/19 0525  CREATININE 0.96 0.70    Disposition: Home  Discharge instruction: The patient was instructed to be ambulatory but told to refrain from heavy lifting, strenuous activity, or driving. detailed  Discharge medications:  Allergies as of 06/05/2019      Reactions   Adhesive [tape] Other (See Comments)   Rash with transderm   Ibuprofen    REACTION: vertigo   Shellfish Allergy Nausea Only, Swelling   Swelling of throat   Amlodipine Hives, Swelling, Rash   Betadine [povidone Iodine] Rash   ALL TOPICAL IODINES      Medication List    STOP taking these medications   naproxen sodium 220 MG tablet Commonly known as: ALEVE     TAKE these medications   albuterol 108 (90 Base) MCG/ACT inhaler Commonly known as: VENTOLIN HFA Inhale into the lungs every 6 (six) hours as needed for wheezing or shortness of breath.   beclomethasone 80 MCG/ACT inhaler Commonly known as: QVAR Inhale 2 puffs into the lungs daily as needed (asthma).   buPROPion 100 MG tablet Commonly known as: Wellbutrin Take 1 tablet (100 mg total) by mouth daily.   buPROPion 150 MG 24 hr tablet Commonly known as: WELLBUTRIN XL Take 150 mg by mouth daily.   chlorthalidone 25 MG tablet Commonly known as: HYGROTON Take 1 tablet (25 mg total) by mouth daily.   Dexilant 60 MG capsule Generic drug: dexlansoprazole Take 60  mg by mouth daily.   docusate sodium 100 MG capsule Commonly known as: COLACE Take 1 capsule (100 mg total) by mouth 2 (two) times daily.   HYDROcodone-acetaminophen 5-325 MG tablet Commonly known as: Norco Take 1-2 tablets by mouth every 6 (six) hours as needed for moderate pain.   losartan 100 MG tablet Commonly known as: COZAAR Take 100 mg by mouth daily.   sertraline 100 MG tablet Commonly known as: ZOLOFT Take 200 mg by mouth daily.   tolterodine 4 MG 24 hr capsule Commonly known as: DETROL LA Take 4 mg by mouth daily.       Followup:  Follow-up Information    Patsy Zaragoza, Nicki Reaper, MD.   Specialty: Urology Why: office will call you with date and time of appt.  Contact information: [place] [address] [phone]

## 2019-06-11 ENCOUNTER — Encounter (INDEPENDENT_AMBULATORY_CARE_PROVIDER_SITE_OTHER): Payer: Self-pay | Admitting: Bariatrics

## 2019-06-17 ENCOUNTER — Other Ambulatory Visit: Payer: Self-pay | Admitting: Family Medicine

## 2019-06-17 DIAGNOSIS — Z1231 Encounter for screening mammogram for malignant neoplasm of breast: Secondary | ICD-10-CM

## 2019-06-24 ENCOUNTER — Encounter (INDEPENDENT_AMBULATORY_CARE_PROVIDER_SITE_OTHER): Payer: Self-pay | Admitting: Physician Assistant

## 2019-06-24 NOTE — Telephone Encounter (Signed)
Please advise pt

## 2019-07-01 ENCOUNTER — Encounter (INDEPENDENT_AMBULATORY_CARE_PROVIDER_SITE_OTHER): Payer: Self-pay

## 2019-07-01 ENCOUNTER — Ambulatory Visit (INDEPENDENT_AMBULATORY_CARE_PROVIDER_SITE_OTHER): Payer: 59 | Admitting: Family Medicine

## 2019-07-03 ENCOUNTER — Ambulatory Visit (INDEPENDENT_AMBULATORY_CARE_PROVIDER_SITE_OTHER): Payer: 59 | Admitting: Family Medicine

## 2019-07-03 ENCOUNTER — Encounter (INDEPENDENT_AMBULATORY_CARE_PROVIDER_SITE_OTHER): Payer: Self-pay | Admitting: Family Medicine

## 2019-07-03 ENCOUNTER — Other Ambulatory Visit: Payer: Self-pay

## 2019-07-03 VITALS — BP 119/76 | HR 73 | Temp 98.0°F | Ht 67.0 in | Wt 258.0 lb

## 2019-07-03 DIAGNOSIS — Z6841 Body Mass Index (BMI) 40.0 and over, adult: Secondary | ICD-10-CM

## 2019-07-03 DIAGNOSIS — E559 Vitamin D deficiency, unspecified: Secondary | ICD-10-CM | POA: Diagnosis not present

## 2019-07-03 DIAGNOSIS — I10 Essential (primary) hypertension: Secondary | ICD-10-CM | POA: Diagnosis not present

## 2019-07-03 DIAGNOSIS — R7303 Prediabetes: Secondary | ICD-10-CM

## 2019-07-03 DIAGNOSIS — F3289 Other specified depressive episodes: Secondary | ICD-10-CM

## 2019-07-03 DIAGNOSIS — D508 Other iron deficiency anemias: Secondary | ICD-10-CM | POA: Diagnosis not present

## 2019-07-09 ENCOUNTER — Encounter (INDEPENDENT_AMBULATORY_CARE_PROVIDER_SITE_OTHER): Payer: Self-pay | Admitting: Family Medicine

## 2019-07-09 NOTE — Progress Notes (Signed)
Chief Complaint: OBESITY Brandy Love is here to discuss her progress with her obesity treatment plan. Brandy Love is on the Category 3 plan and states she is following her eating plan approximately 0 % of the time. Brandy Love states she is exercising 0 minutes 0 times per week.  Today's visit was # 14  Starting weight: 273 lbs Starting date: 10/17/17 Today's weight : 258 lbs Today's date: 07/03/2019 Total lbs lost to date: 15 Total lbs lost since last in-office visit: 7  Subjective:   Interim History: Brandy Love was not able to work the program earlier this year due to taking care of her mother in-law with dementia. She has since died. Life is still busy taking care of her husband and her father in-law (both with health issues). She still wants grab and go options.  1. Vitamin D deficiency Brandy Love is taking prescription Vit D. She has no recent labs.  2. Essential hypertension Brandy Love's blood pressure is at goal. She is taking medications.  BP Readings from Last 3 Encounters:  07/03/19 119/76  06/05/19 122/66  06/03/19 134/66   3. Pre-diabetes Brandy Love is not on medications. Last A1c was 6.1.  4. Other iron deficiency anemia Brandy Love has iron deficiency anemia. She has no recent labs.  5. Other depression, with emotional eating Brandy Love is still not planning well, and she is not being mindful.  Reviewed and updated this visit by clinician and staff: allergies, medications, problem list, medical history, surgical history, family history, social history and previous encounter notes.  General review of systems is unchanged or negative, with the exception of new weight loss.  Assessment:   1. Vitamin D deficiency   2. Essential hypertension   3. Prediabetes   4. Other iron deficiency anemia   5. Other depression, with emotional eating   6. Class 3 severe obesity with serious comorbidity and body mass index (BMI) of 40.0 to 44.9 in adult, unspecified obesity type (Brandy Love)    Plan:   1.  Vitamin D deficiency Low Vitamin D level contributes to fatigue and are associated with obesity, breast, and colon cancer. Brandy Love agrees to continue taking prescription Vitamin D 50,000 IU every 3 days and will follow-up for routine testing of vitamin D, at least 2-3 times per year to avoid over-replacement. Brandy Love agrees to follow up with [place] as directed to monitor her progress.  2. Essential hypertension Brandy Love is working on healthy weight loss and exercise to improve blood pressure control. We will watch for signs of hypotension as she continues her lifestyle modifications. We will continue to monitor.  3. Pre-diabetes Brandy Love will continue to work on weight loss, exercise, and decreasing simple carbohydrates to help decrease the risk of diabetes. We will continue to monitor.  4. Other iron deficiency anemia The diagnosis of anemia was discussed with Brandy Love. She was given suggestions of iron rich foods. An iron supplement was not prescribed. We will continue to monitor.  5. Other depression, with emotional eating Behavior modification techniques were discussed today to help Brandy Love deal with her emotional/non-hunger eating behaviors. We will continue to follow and monitor her progress.  6. Class 3 severe obesity with serious comorbidity and body mass index (BMI) of 40.0 to 44.9 in adult, unspecified obesity type ([facility]) Brandy Love is currently in the action stage of change. As such, her goal is to continue with weight loss efforts. She has agreed to follow the Category 3 plan. The exercise goal is to eventually work up  to 150 minutes of combined cardio and strengthening exercise per week for weight loss and overall health benefits. We discussed the following Behavioral Modification Strategies today: increasing lean protein intake, increasing water intake and meal planning and cooking strategies. Brandy Love's first goal is to work on breakfast with 25+ grams of protein and <500 calories. We will recheck fasting labs and IC at  her next visit.   Brandy Love has agreed to follow-up with our clinic in 3 weeks. She was informed of the importance of frequent follow-up visits to maximize her success with intensive lifestyle modifications for her multiple health conditions.  Objective:   Blood pressure 119/76, pulse 73, temperature 98 F (36.7 C), temperature source Oral, height 5\' 7"  (1.702 m), weight 258 lb (117 kg), SpO2 98 %. Body mass index is 40.41 kg/m.  General: Cooperative, alert, well developed, in no acute distress. HEENT: Conjunctivae and lids unremarkable. Neck: No thyromegaly.  Cardiovascular: Regular rhythm.  Lungs: Normal work of breathing. Extremities: No edema.  Neurologic: No focal deficits.   Lab Results  Component Value Date   CREATININE 0.70 06/05/2019   BUN 9 06/05/2019   NA 140 06/05/2019   K 3.7 06/05/2019   CL 104 06/05/2019   CO2 29 06/05/2019   Lab Results  Component Value Date   ALT 23 06/26/2018   AST 18 06/26/2018   ALKPHOS 79 06/26/2018   BILITOT 0.3 06/26/2018   Lab Results  Component Value Date   HGBA1C 6.1 (H) 06/03/2019   HGBA1C 6.2 (H) 06/26/2018   HGBA1C 6.4 (H) 03/07/2018   HGBA1C 6.3 (H) 10/17/2017   Lab Results  Component Value Date   INSULIN 30.0 (H) 06/26/2018   INSULIN 35.1 (H) 03/07/2018   INSULIN 25.3 (H) 10/17/2017   Lab Results  Component Value Date   TSH 2.920 10/17/2017   Lab Results  Component Value Date   CHOL 200 (H) 06/26/2018   HDL 44 06/26/2018   LDLCALC 127 (H) 06/26/2018   TRIG 143 06/26/2018   Lab Results  Component Value Date   WBC 5.8 06/03/2019   HGB 10.9 (L) 06/05/2019   HCT 36.0 06/05/2019   MCV 83.1 06/03/2019   PLT 228 06/03/2019   No results found for: IRON, TIBC, FERRITIN Obesity Behavioral Intervention Visit Documentation for Insurance (15 Minutes):   ASK: We discussed the diagnosis of obesity with Brandy Love today and Brandy Love agreed to give [place] permission to discuss obesity behavioral modification therapy  today.  ASSESS: Brandy Love has the diagnosis of obesity and her BMI today is 40.4. Brandy Love is in the action stage of change.   ADVISE: Brandy Love was educated on the multiple health risks of obesity as well as the benefit of weight loss to improve her health. She was advised of the need for long term treatment and the importance of lifestyle modifications to improve her current health and to decrease her risk of future health problems.  AGREE: Multiple dietary modification options and treatment options were discussed and  Rmoni agreed to follow the recommendations documented in the above note.  ARRANGE: Devann was educated on the importance of frequent visits to treat obesity as outlined per CMS and USPSTF guidelines and agreed to schedule her next follow up appointment today.  Attestation Statements:   Wilhemena Durie, am acting as transcriptionist for [employer], DO.  I have reviewed the above documentation for accuracy and completeness, and I agree with the above. Briscoe Deutscher, DO

## 2019-07-09 NOTE — Progress Notes (Deleted)
Chief Complaint: OBESITY MARSHAL STOOS is here to discuss her progress with her obesity treatment plan. Nelda Marseille is on the {MWMwtlossportion/plan2:23431} and states she is following her eating plan approximately *** % of the time. Nelda Marseille states she is exercising *** minutes *** times per week.  Today's visit was # {Numbers; [id number]  Starting weight: *** Starting date: *** Today's weight : *** Today's date: 07/09/2019 Total lbs lost to date: *** Total lbs lost since last in-office visit: ***  Subjective:   Interim History: ***.  1. Vitamin D deficiency ***  2. Essential hypertension ***  3. Prediabetes ***  4. Other iron deficiency anemia ***  5. Other depression, with emotional eating ***  Reviewed and updated this visit by clinician and staff: {MWMREVIEWED:23436::"allergies","medications","problem list","medical history","surgical history","family history","social history","previous encounter notes"}.  [id number]  Assessment:   1. Vitamin D deficiency   2. Essential hypertension   3. Prediabetes   4. Other iron deficiency anemia   5. Other depression, with emotional eating   6. Class 3 severe obesity with serious comorbidity and body mass index (BMI) of 40.0 to 44.9 in adult, unspecified obesity type ([place])    Plan:   1. Vitamin D deficiency ***  2. Essential hypertension ***  3. Prediabetes ***  4. Other iron deficiency anemia ***  5. Other depression, with emotional eating ***  6. Class 3 severe obesity with serious comorbidity and body mass index (BMI) of 40.0 to 44.9 in adult, unspecified obesity type ([place]) [place] {CHL AMB IS/IS NOT:[id number]} currently in the action stage of change. As such, her goal is to ***. She has agreed to {MWMwtlossportion/plan2:23431}. The exercise goal is to eventually work up to 150 minutes of combined cardio and strengthening exercise per week for weight loss and overall health benefits. We  discussed the following Behavioral Modification Strategies today: {MWMwtlossdietstrategies3:23432}.  ALASHIA FATIMA has agreed to follow-up with our clinic in {NUMBER 1-10:22536} weeks. She was informed of the importance of frequent follow-up visits to maximize her success with intensive lifestyle modifications for her multiple health conditions.  Objective:   Blood pressure 119/76, pulse 73, temperature 98 F (36.7 C), temperature source Oral, height 5\' 7"  (1.702 m), weight 258 lb (117 kg), SpO2 98 %. Body mass index is 40.41 kg/m.  General: Cooperative, alert, well developed, in no acute distress. HEENT: Conjunctivae and lids unremarkable. Neck: No thyromegaly.  Cardiovascular: Regular rhythm.  Lungs: Normal work of breathing. Extremities: No edema.  Neurologic: No focal deficits.   Lab Results  Component Value Date   CREATININE 0.70 06/05/2019   BUN 9 06/05/2019   NA 140 06/05/2019   K 3.7 06/05/2019   CL 104 06/05/2019   CO2 29 06/05/2019   Lab Results  Component Value Date   ALT 23 06/26/2018   AST 18 06/26/2018   ALKPHOS 79 06/26/2018   BILITOT 0.3 06/26/2018   Lab Results  Component Value Date   HGBA1C 6.1 (H) 06/03/2019   HGBA1C 6.2 (H) 06/26/2018   HGBA1C 6.4 (H) 03/07/2018   HGBA1C 6.3 (H) 10/17/2017   Lab Results  Component Value Date   INSULIN 30.0 (H) 06/26/2018   INSULIN 35.1 (H) 03/07/2018   INSULIN 25.3 (H) 10/17/2017   Lab Results  Component Value Date   TSH 2.920 10/17/2017   Lab Results  Component Value Date   CHOL 200 (H) 06/26/2018   HDL 44 06/26/2018   LDLCALC 127 (H) 06/26/2018   TRIG 143 06/26/2018  No results found for: CBC  No results found for: IRON, TIBC, FERRITIN Obesity Behavioral Intervention Visit Documentation for Insurance (15 Minutes):   ASK: We discussed the diagnosis of obesity with Nelda Marseille today and Hiliary agreed to give [place] permission to discuss obesity behavioral modification therapy today.  ASSESS: Bahareh has  the diagnosis of obesity and her BMI today is @TBMI @. [place] {ACTION; IS/IS [id number] in the action stage of change.   ADVISE: Lenny was educated on the multiple health risks of obesity as well as the benefit of weight loss to improve her health. She was advised of the need for long term treatment and the importance of lifestyle modifications to improve her current health and to decrease her risk of future health problems.  AGREE: Multiple dietary modification options and treatment options were discussed and  Kasey agreed to follow the recommendations documented in the above note.  ARRANGE: Phelicia was educated on the importance of frequent visits to treat obesity as outlined per CMS and USPSTF guidelines and agreed to schedule her next follow up appointment today.  Attestation Statements:   I, ***, am acting as transcriptionist for ***.  ***

## 2019-07-29 ENCOUNTER — Other Ambulatory Visit: Payer: Self-pay

## 2019-07-29 ENCOUNTER — Encounter (INDEPENDENT_AMBULATORY_CARE_PROVIDER_SITE_OTHER): Payer: Self-pay | Admitting: Family Medicine

## 2019-07-29 ENCOUNTER — Ambulatory Visit (INDEPENDENT_AMBULATORY_CARE_PROVIDER_SITE_OTHER): Payer: 59 | Admitting: Family Medicine

## 2019-07-29 VITALS — BP 145/80 | HR 62 | Temp 98.1°F | Ht 67.0 in | Wt 263.0 lb

## 2019-07-29 DIAGNOSIS — R0602 Shortness of breath: Secondary | ICD-10-CM

## 2019-07-29 DIAGNOSIS — D508 Other iron deficiency anemias: Secondary | ICD-10-CM

## 2019-07-29 DIAGNOSIS — I10 Essential (primary) hypertension: Secondary | ICD-10-CM | POA: Diagnosis not present

## 2019-07-29 DIAGNOSIS — E559 Vitamin D deficiency, unspecified: Secondary | ICD-10-CM

## 2019-07-29 DIAGNOSIS — R7303 Prediabetes: Secondary | ICD-10-CM

## 2019-07-29 DIAGNOSIS — Z6841 Body Mass Index (BMI) 40.0 and over, adult: Secondary | ICD-10-CM

## 2019-07-29 DIAGNOSIS — Z9189 Other specified personal risk factors, not elsewhere classified: Secondary | ICD-10-CM | POA: Diagnosis not present

## 2019-07-29 DIAGNOSIS — R632 Polyphagia: Secondary | ICD-10-CM

## 2019-07-29 NOTE — Progress Notes (Signed)
Chief Complaint:   OBESITY Brandy Love is here to discuss her progress with her obesity treatment plan along with follow-up of her obesity related diagnoses. Brandy Love is on the Category 3 Plan and states she is following her eating plan approximately 10% of the time. Brandy Love states she is exercising for 0 minutes 0 times per week.  Today's visit was #: 15 Starting weight: 273 lbs Starting date: 10/17/2017 Today's weight: 263 lbs Today's date: 07/29/2019 Total lbs lost to date: 10 lbs Total lbs lost since last in-office visit: 0  Interim History: Brandy Love is still taking care of her father-in-law.  She did work on eating a protein-packed breakfast over the past 2 weeks.   Subjective:   1. Vitamin D deficiency Brandy Love's Vitamin D level was 30.8 on 06/26/2018. She is not currently taking vit D. She denies nausea, vomiting or muscle weakness.  2. SOB (shortness of breath) on exertion Brandy Love states she has shortness of breath on exertion.  3. Essential hypertension This is not at goal at this time.  Review: taking medications as instructed, no medication side effects noted, no chest pain on exertion, no dyspnea on exertion, no swelling of ankles.   BP Readings from Last 3 Encounters:  07/29/19 (!) 145/80  07/03/19 119/76  06/05/19 122/66   4. Prediabetes Brandy Love has a diagnosis of prediabetes based on her elevated HgA1c and was informed this puts her at greater risk of developing diabetes. She continues to work on diet and exercise to decrease her risk of diabetes. She denies nausea or hypoglycemia.  Lab Results  Component Value Date   HGBA1C 6.1 (H) 06/03/2019   Lab Results  Component Value Date   INSULIN 30.0 (H) 06/26/2018   INSULIN 35.1 (H) 03/07/2018   INSULIN 25.3 (H) 10/17/2017   5. Other iron deficiency anemia Brandy Love is not a vegetarian.  She does not have a history of weight loss surgery.   CBC Latest Ref Rng & Units 06/05/2019 06/04/2019 06/03/2019  WBC 4.0 - 10.5 K/uL - - 5.8    Hemoglobin 12.0 - 15.0 g/dL 10.9(L) 11.9(L) 12.3  Hematocrit 36.0 - 46.0 % 36.0 38.3 39.4  Platelets 150 - 400 K/uL - - 228   No results found for: IRON, TIBC, FERRITIN Lab Results  Component Value Date   VITAMINB12 676 10/17/2017   6. Polyphagia Brandy Love's food recall shows that she had a Educational psychologist and a breakfast fajita for breakfast this morning and was hungry at 11:00 am.  7. At risk for diabetes mellitus Brandy Love is at higher than average risk for developing diabetes due to her obesity.   Assessment/Plan:   1. Vitamin D deficiency Low Vitamin D level contributes to fatigue and are associated with obesity, breast, and colon cancer. We will check labs today.  Orders - VITAMIN D 25 Hydroxy (Vit-D Deficiency, Fractures)  2. SOB (shortness of breath) on exertion IC reviewed. See flowsheet.  Orders - Vitamin B12 - T3 - T4, free - TSH  3. Essential hypertension [name] is working on healthy weight loss and exercise to improve blood pressure control. We will watch for signs of hypotension as she continues her lifestyle modifications.  Orders - Comprehensive metabolic panel - CBC with Differential/Platelet - Lipid Panel With LDL/HDL Ratio  4. Prediabetes Brandy Love will continue to work on weight loss, exercise, and decreasing simple carbohydrates to help decrease the risk of diabetes.   Orders - Comprehensive metabolic panel - CBC with Differential/Platelet - Hemoglobin A1c - Insulin, random -  Lipid Panel With LDL/HDL Ratio  5. Other iron deficiency anemia Orders and follow up as documented in patient record.  Counseling . Iron is essential for our bodies to make red blood cells.  Reasons that someone may be deficient include: an iron-deficient diet (more likely in those following vegan or vegetarian diets), women with heavy menses, patients with GI disorders or poor absorption, patients that have had bariatric surgery, frequent blood donors, patients with cancer, and  patients with heart disease.   Brandy Love foods include dark leafy greens, red and white meats, eggs, seafood, and beans.   . Certain foods and drinks prevent your body from absorbing iron properly. Avoid eating these foods in the same meal as iron-rich foods or with iron supplements. These foods include: coffee, black tea, and red wine; milk, dairy products, and foods that are high in calcium; beans and soybeans; whole grains.  . Constipation can be a side effect of iron supplementation. Increased water and fiber intake are helpful. Water goal: > 2 liters/day. Fiber goal: > 25 grams/day.  Orders - CBC with Differential/Platelet  6. Polyphagia Reviewed importance of protein at all meals and snacks.  7. At risk for diabetes mellitus Brandy Love was given approximately 15 minutes of diabetes education and counseling today. We discussed intensive lifestyle modifications today with an emphasis on weight loss as well as increasing exercise and decreasing simple carbohydrates in her diet. We also reviewed medication options with an emphasis on risk versus benefit of those discussed.   Repetitive spaced learning was employed today to elicit superior memory formation and behavioral change.  8. Class 3 severe obesity with serious comorbidity and body mass index (BMI) of 40.0 to 44.9 in adult, unspecified obesity type ([facility]) Brandy Love is currently in the action stage of change. As such, her goal is to continue with weight loss efforts. She has agreed to the Category 3 Plan.   Exercise goals: For substantial health benefits, adults should do at least 150 minutes (2 hours and 30 minutes) a week of moderate-intensity, or 75 minutes (1 hour and 15 minutes) a week of vigorous-intensity aerobic physical activity, or an equivalent combination of moderate- and vigorous-intensity aerobic activity. Aerobic activity should be performed in episodes of at least 10 minutes, and preferably, it should be spread throughout the week.  Adults should also include muscle-strengthening activities that involve all major muscle groups on 2 or more days a week.  Behavioral modification strategies: increasing lean protein intake, decreasing simple carbohydrates, increasing vegetables and increasing water intake.  Will focus on breakfast and lunch.  Brandy Love has agreed to follow-up with our clinic in 2 weeks. She was informed of the importance of frequent follow-up visits to maximize her success with intensive lifestyle modifications for her multiple health conditions.   Brandy Love was informed we would discuss her lab results at her next visit unless there is a critical issue that needs to be addressed sooner. Brandy Love agreed to keep her next visit at the agreed upon time to discuss these results.  Objective:   Blood pressure (!) 145/80, pulse 62, temperature 98.1 F (36.7 C), temperature source Oral, height 5\' 7"  (1.702 m), weight 263 lb (119.3 kg), SpO2 98 %. Body mass index is 41.19 kg/m.  General: Cooperative, alert, well developed, in no acute distress. HEENT: Conjunctivae and lids unremarkable. Cardiovascular: Regular rhythm.  Lungs: Normal work of breathing. Neurologic: No focal deficits.   Lab Results  Component Value Date   CREATININE 0.70 06/05/2019   BUN 9 06/05/2019  NA 140 06/05/2019   K 3.7 06/05/2019   CL 104 06/05/2019   CO2 29 06/05/2019   Lab Results  Component Value Date   ALT 23 06/26/2018   AST 18 06/26/2018   ALKPHOS 79 06/26/2018   BILITOT 0.3 06/26/2018   Lab Results  Component Value Date   HGBA1C 6.1 (H) 06/03/2019   HGBA1C 6.2 (H) 06/26/2018   HGBA1C 6.4 (H) 03/07/2018   HGBA1C 6.3 (H) 10/17/2017   Lab Results  Component Value Date   INSULIN 30.0 (H) 06/26/2018   INSULIN 35.1 (H) 03/07/2018   INSULIN 25.3 (H) 10/17/2017   Lab Results  Component Value Date   TSH 2.920 10/17/2017   Lab Results  Component Value Date   CHOL 200 (H) 06/26/2018   HDL 44 06/26/2018   LDLCALC 127 (H)  06/26/2018   TRIG 143 06/26/2018   Lab Results  Component Value Date   WBC 5.8 06/03/2019   HGB 10.9 (L) 06/05/2019   HCT 36.0 06/05/2019   MCV 83.1 06/03/2019   PLT 228 06/03/2019   Attestation Statements:   Reviewed by clinician on day of visit: allergies, medications, problem list, medical history, surgical history, family history, social history, and previous encounter notes.  I, Water quality scientist, CMA, am acting as Location manager for [employer], DO.  I have reviewed the above documentation for accuracy and completeness, and I agree with the above. Briscoe Deutscher, DO   Time spent on visit including pre-visit chart review and post-visit care was 45 minutes. Time was spent on: preparing to see the patient (eg, review of tests), obtaining and/or reviewing separately obtained history, performing a medically necessary appropriate examination and/or evaluation, counseling and educating the patient/family/caregiver, ordering medications, tests, or procedures, referring and communicating with other health care professionals , documenting clinical information in the electronic or other health and care coordination .

## 2019-07-30 LAB — COMPREHENSIVE METABOLIC PANEL
ALT: 19 IU/L (ref 0–32)
AST: 18 IU/L (ref 0–40)
Albumin/Globulin Ratio: 1.3 (ref 1.2–2.2)
Albumin: 4.1 g/dL (ref 3.8–4.8)
Alkaline Phosphatase: 83 IU/L (ref 39–117)
BUN/Creatinine Ratio: 15 (ref 12–28)
BUN: 13 mg/dL (ref 8–27)
Bilirubin Total: 0.2 mg/dL (ref 0.0–1.2)
CO2: 26 mmol/L (ref 20–29)
Calcium: 9 mg/dL (ref 8.7–10.3)
Chloride: 105 mmol/L (ref 96–106)
Creatinine, Ser: 0.84 mg/dL (ref 0.57–1.00)
GFR calc Af Amer: 86 mL/min/{1.73_m2} (ref >59)
GFR calc non Af Amer: 74 mL/min/{1.73_m2} (ref >59)
Globulin, Total: 3.1 g/dL (ref 1.5–4.5)
Glucose: 102 mg/dL — ABNORMAL HIGH (ref 65–99)
Potassium: 4.7 mmol/L (ref 3.5–5.2)
Sodium: 143 mmol/L (ref 134–144)
Total Protein: 7.2 g/dL (ref 6.0–8.5)

## 2019-07-30 LAB — CBC WITH DIFFERENTIAL/PLATELET
Basophils Absolute: 0 10*3/uL (ref 0.0–0.2)
Basos: 1 %
EOS (ABSOLUTE): 0.2 10*3/uL (ref 0.0–0.4)
Eos: 4 %
Hematocrit: 35.2 % (ref 34.0–46.6)
Hemoglobin: 11.3 g/dL (ref 11.1–15.9)
Immature Grans (Abs): 0 10*3/uL (ref 0.0–0.1)
Immature Granulocytes: 0 %
Lymphocytes Absolute: 2 10*3/uL (ref 0.7–3.1)
Lymphs: 39 %
MCH: 25.4 pg — ABNORMAL LOW (ref 26.6–33.0)
MCHC: 32.1 g/dL (ref 31.5–35.7)
MCV: 79 fL (ref 79–97)
Monocytes Absolute: 0.4 10*3/uL (ref 0.1–0.9)
Monocytes: 7 %
Neutrophils Absolute: 2.5 10*3/uL (ref 1.4–7.0)
Neutrophils: 49 %
Platelets: 216 10*3/uL (ref 150–450)
RBC: 4.45 x10E6/uL (ref 3.77–5.28)
RDW: 14.6 % (ref 11.7–15.4)
WBC: 5.1 10*3/uL (ref 3.4–10.8)

## 2019-07-30 LAB — LIPID PANEL WITH LDL/HDL RATIO
Cholesterol, Total: 189 mg/dL (ref 100–199)
HDL: 43 mg/dL (ref >39)
LDL Chol Calc (NIH): 125 mg/dL — ABNORMAL HIGH (ref 0–99)
LDL/HDL Ratio: 2.9 ratio (ref 0.0–3.2)
Triglycerides: 119 mg/dL (ref 0–149)
VLDL Cholesterol Cal: 21 mg/dL (ref 5–40)

## 2019-07-30 LAB — T3: T3, Total: 106 ng/dL (ref 71–180)

## 2019-07-30 LAB — INSULIN, RANDOM: INSULIN: 17.3 u[IU]/mL (ref 2.6–24.9)

## 2019-07-30 LAB — HEMOGLOBIN A1C
Est. average glucose Bld gHb Est-mCnc: 131 mg/dL
Hgb A1c MFr Bld: 6.2 % — ABNORMAL HIGH (ref 4.8–5.6)

## 2019-07-30 LAB — T4, FREE: Free T4: 0.92 ng/dL (ref 0.82–1.77)

## 2019-07-30 LAB — VITAMIN B12: Vitamin B-12: 355 pg/mL (ref 232–1245)

## 2019-07-30 LAB — VITAMIN D 25 HYDROXY (VIT D DEFICIENCY, FRACTURES): Vit D, 25-Hydroxy: 19.4 ng/mL — ABNORMAL LOW (ref 30.0–100.0)

## 2019-07-30 LAB — TSH: TSH: 2.35 u[IU]/mL (ref 0.450–4.500)

## 2019-08-14 ENCOUNTER — Encounter (INDEPENDENT_AMBULATORY_CARE_PROVIDER_SITE_OTHER): Payer: Self-pay | Admitting: Physician Assistant

## 2019-08-14 ENCOUNTER — Other Ambulatory Visit: Payer: Self-pay

## 2019-08-14 ENCOUNTER — Ambulatory Visit (INDEPENDENT_AMBULATORY_CARE_PROVIDER_SITE_OTHER): Payer: 59 | Admitting: Physician Assistant

## 2019-08-14 VITALS — BP 124/74 | HR 83 | Temp 97.7°F | Ht 67.0 in | Wt 259.0 lb

## 2019-08-14 DIAGNOSIS — E7849 Other hyperlipidemia: Secondary | ICD-10-CM

## 2019-08-14 DIAGNOSIS — E559 Vitamin D deficiency, unspecified: Secondary | ICD-10-CM | POA: Diagnosis not present

## 2019-08-14 DIAGNOSIS — Z6841 Body Mass Index (BMI) 40.0 and over, adult: Secondary | ICD-10-CM

## 2019-08-14 DIAGNOSIS — Z9189 Other specified personal risk factors, not elsewhere classified: Secondary | ICD-10-CM

## 2019-08-14 MED ORDER — VITAMIN D (ERGOCALCIFEROL) 1.25 MG (50000 UNIT) PO CAPS: ORAL_CAPSULE | ORAL | 0 refills | Status: DC

## 2019-08-14 NOTE — Progress Notes (Signed)
Chief Complaint:   OBESITY Brandy Love is here to discuss her progress with her obesity treatment plan along with follow-up of her obesity related diagnoses. Brandy Love is on the Category 3 Plan and states she is following her eating plan approximately 10-15% of the time. Brandy Love states she is exercising 0 minutes 0 times per week.  Today's visit was #: 67 Starting weight: 273 lbs Starting date: 10/17/2017 Today's weight: 259 lbs Today's date: 08/14/2019 Total lbs lost to date: 14  Total lbs lost since last in-office visit: 4  Interim History: Brandy Love states that she has been working on snacking choices. She does not always eat dinner due to lack of hunger.  Subjective:   Vitamin D deficiency.  Brandy Love is on no medications. Last Vitamin D level 19.4 on 07/29/2019.  Other hyperlipidemia. Brandy Love is on no medications. No chest pain. She is not exercising.   Lab Results  Component Value Date   CHOL 189 07/29/2019   HDL 43 07/29/2019   LDLCALC 125 (H) 07/29/2019   TRIG 119 07/29/2019   Lab Results  Component Value Date   ALT 19 07/29/2019   AST 18 07/29/2019   ALKPHOS 83 07/29/2019   BILITOT 0.2 07/29/2019   The 10-year ASCVD risk score Brandy Love DC Jr., et al., 2013) is: 6.2%   Values used to calculate the score:     Age: 32 years     Sex: Female     Is Non-Hispanic African American: No     Diabetic: No     Tobacco smoker: No     Systolic Blood Pressure: [id number] mmHg     Is BP treated: Yes     HDL Cholesterol: 43 mg/dL     Total Cholesterol: 189 mg/dL  At risk for osteoporosis. Brandy Love is at higher risk of osteopenia and osteoporosis due to Vitamin D deficiency.   Assessment/Plan:   Vitamin D deficiency. Low Vitamin D level contributes to fatigue and are associated with obesity, breast, and colon cancer. She was given a refill on her Vitamin D, Ergocalciferol, (DRISDOL) 1.25 MG (50000 UNIT) CAPS capsule every week #4 with 0 refills and will follow-up for routine testing of Vitamin D, at  least 2-3 times per year to avoid over-replacement.     Other hyperlipidemia. Cardiovascular risk and specific lipid/LDL goals reviewed.  We discussed several lifestyle modifications today and Susannah will continue to work on diet, exercise and weight loss efforts. Orders and follow up as documented in patient record.   Counseling Intensive lifestyle modifications are the first line treatment for this issue. . Dietary changes: Increase soluble fiber. Decrease simple carbohydrates. . Exercise changes: Moderate to vigorous-intensity aerobic activity 150 minutes per week if tolerated. . Lipid-lowering medications: see documented in medical record.  At risk for osteoporosis. Reather was given approximately 15 minutes of osteoporosis prevention counseling today. Brandy Love is at risk for osteopenia and osteoporosis due to her Vitamin D deficiency. She was encouraged to take her Vitamin D and follow her higher calcium diet and increase strengthening exercise to help strengthen her bones and decrease her risk of osteopenia and osteoporosis.  Repetitive spaced learning was employed today to elicit superior memory formation and behavioral change.  Class 3 severe obesity with serious comorbidity and body mass index (BMI) of 40.0 to 44.9 in adult, unspecified obesity type ([place]).  Brandy Love is currently in the action stage of change. As such, her goal is to continue with weight loss efforts. She has agreed to the  Category 3 Plan.   Exercise goals: For substantial health benefits, adults should do at least 150 minutes (2 hours and 30 minutes) a week of moderate-intensity, or 75 minutes (1 hour and 15 minutes) a week of vigorous-intensity aerobic physical activity, or an equivalent combination of moderate- and vigorous-intensity aerobic activity. Aerobic activity should be performed in episodes of at least 10 minutes, and preferably, it should be spread throughout the week.  Behavioral modification strategies: increasing lean  protein intake and no skipping meals.  Brandy Love has agreed to follow-up with our clinic in 2 weeks. She was informed of the importance of frequent follow-up visits to maximize her success with intensive lifestyle modifications for her multiple health conditions.   Objective:   Blood pressure 124/74, pulse 83, temperature 97.7 F (36.5 C), temperature source Oral, height 5\' 7"  (1.702 m), weight 259 lb (117.5 kg), SpO2 96 %. Body mass index is 40.57 kg/m.  General: Cooperative, alert, well developed, in no acute distress. HEENT: Conjunctivae and lids unremarkable. Cardiovascular: Regular rhythm.  Lungs: Normal work of breathing. Neurologic: No focal deficits.   Lab Results  Component Value Date   CREATININE 0.84 07/29/2019   BUN 13 07/29/2019   NA 143 07/29/2019   K 4.7 07/29/2019   CL 105 07/29/2019   CO2 26 07/29/2019   Lab Results  Component Value Date   ALT 19 07/29/2019   AST 18 07/29/2019   ALKPHOS 83 07/29/2019   BILITOT 0.2 07/29/2019   Lab Results  Component Value Date   HGBA1C 6.2 (H) 07/29/2019   HGBA1C 6.1 (H) 06/03/2019   HGBA1C 6.2 (H) 06/26/2018   HGBA1C 6.4 (H) 03/07/2018   HGBA1C 6.3 (H) 10/17/2017   Lab Results  Component Value Date   INSULIN 17.3 07/29/2019   INSULIN 30.0 (H) 06/26/2018   INSULIN 35.1 (H) 03/07/2018   INSULIN 25.3 (H) 10/17/2017   Lab Results  Component Value Date   TSH 2.350 07/29/2019   Lab Results  Component Value Date   CHOL 189 07/29/2019   HDL 43 07/29/2019   LDLCALC 125 (H) 07/29/2019   TRIG 119 07/29/2019   Lab Results  Component Value Date   WBC 5.1 07/29/2019   HGB 11.3 07/29/2019   HCT 35.2 07/29/2019   MCV 79 07/29/2019   PLT 216 07/29/2019   No results found for: IRON, TIBC, FERRITIN  Attestation Statements:   Reviewed by clinician on day of visit: allergies, medications, problem list, medical history, surgical history, family history, social history, and previous encounter notes.  IMichaelene Song, am  acting as transcriptionist for Abby Potash, PA-C   I have reviewed the above documentation for accuracy and completeness, and I agree with the above. Abby Potash, PA-C

## 2019-08-29 ENCOUNTER — Ambulatory Visit (INDEPENDENT_AMBULATORY_CARE_PROVIDER_SITE_OTHER): Payer: 59 | Admitting: Bariatrics

## 2019-09-05 ENCOUNTER — Other Ambulatory Visit: Payer: Self-pay

## 2019-09-05 ENCOUNTER — Encounter (INDEPENDENT_AMBULATORY_CARE_PROVIDER_SITE_OTHER): Payer: Self-pay | Admitting: Physician Assistant

## 2019-09-05 ENCOUNTER — Ambulatory Visit (INDEPENDENT_AMBULATORY_CARE_PROVIDER_SITE_OTHER): Payer: 59 | Admitting: Physician Assistant

## 2019-09-05 VITALS — BP 135/80 | HR 78 | Temp 97.8°F | Ht 67.0 in | Wt 257.0 lb

## 2019-09-05 DIAGNOSIS — E7849 Other hyperlipidemia: Secondary | ICD-10-CM | POA: Diagnosis not present

## 2019-09-05 DIAGNOSIS — Z6841 Body Mass Index (BMI) 40.0 and over, adult: Secondary | ICD-10-CM | POA: Diagnosis not present

## 2019-09-05 DIAGNOSIS — I10 Essential (primary) hypertension: Secondary | ICD-10-CM

## 2019-09-05 NOTE — Progress Notes (Signed)
Chief Complaint:   OBESITY Brandy Love is here to discuss her progress with her obesity treatment plan along with follow-up of her obesity related diagnoses. Brandy Love is on the Category 3 Plan and states she is following her eating plan approximately 50% of the time. Brandy Love states she is exercising 0 minutes 0 times per week.  Today's visit was #: 34 Starting weight: 273 lbs Starting date: 10/17/2017 Today's weight: 257 lbs Today's date: 09/05/2019 Total lbs lost to date: 16 Total lbs lost since last in-office visit: 2  Interim History: Brandy Love reports that she did a better job getting in protein the last 2 weeks. She is using cheese sticks and nuts for some of her snacks.  Subjective:   Essential hypertension. Brandy Love is on chlorthalidone and losartan. Blood pressure is normal. No chest pain or headache.  BP Readings from Last 3 Encounters:  09/05/19 135/80  08/14/19 124/74  07/29/19 (!) 145/80   Lab Results  Component Value Date   CREATININE 0.84 07/29/2019   CREATININE 0.70 06/05/2019   CREATININE 0.96 06/03/2019   Other hyperlipidemia. Brandy Love is on no medications. No chest pain. She is not exercising.   Lab Results  Component Value Date   CHOL 189 07/29/2019   HDL 43 07/29/2019   LDLCALC 125 (H) 07/29/2019   TRIG 119 07/29/2019   Lab Results  Component Value Date   ALT 19 07/29/2019   AST 18 07/29/2019   ALKPHOS 83 07/29/2019   BILITOT 0.2 07/29/2019   The 10-year ASCVD risk score [name] DC Jr., et al., 2013) is: 7.3%   Values used to calculate the score:     Age: 46 years     Sex: Female     Is Non-Hispanic African American: No     Diabetic: No     Tobacco smoker: No     Systolic Blood Pressure: [id number] mmHg     Is BP treated: Yes     HDL Cholesterol: 43 mg/dL     Total Cholesterol: 189 mg/dL  Assessment/Plan:   Essential hypertension. Brandy Love is working on healthy weight loss and exercise to improve blood pressure control. We will watch for signs of hypotension as  she continues her lifestyle modifications. She will continue her medications as directed.  Other hyperlipidemia. Cardiovascular risk and specific lipid/LDL goals reviewed.  We discussed several lifestyle modifications today and Brandy Love will continue to work on diet, exercise and weight loss efforts. Orders and follow up as documented in patient record.   Counseling Intensive lifestyle modifications are the first line treatment for this issue. . Dietary changes: Increase soluble fiber. Decrease simple carbohydrates. . Exercise changes: Moderate to vigorous-intensity aerobic activity 150 minutes per week if tolerated. . Lipid-lowering medications: see documented in medical record.  Class 3 severe obesity with serious comorbidity and body mass index (BMI) of 40.0 to 44.9 in adult, unspecified obesity type ([place]).  Brandy Love is currently in the action stage of change. As such, her goal is to continue with weight loss efforts. She has agreed to the Category 3 Plan.   Exercise goals: For substantial health benefits, adults should do at least 150 minutes (2 hours and 30 minutes) a week of moderate-intensity, or 75 minutes (1 hour and 15 minutes) a week of vigorous-intensity aerobic physical activity, or an equivalent combination of moderate- and vigorous-intensity aerobic activity. Aerobic activity should be performed in episodes of at least 10 minutes, and preferably, it should be spread throughout the week.  Behavioral modification  strategies: increasing lean protein intake and no skipping meals.  Brandy Love has agreed to follow-up with our clinic in 2-3 weeks. She was informed of the importance of frequent follow-up visits to maximize her success with intensive lifestyle modifications for her multiple health conditions.   Objective:   Blood pressure 135/80, pulse 78, temperature 97.8 F (36.6 C), height 5\' 7"  (1.702 m), weight 257 lb (116.6 kg), SpO2 97 %. Body mass index is 40.25 kg/m.  General:  Cooperative, alert, well developed, in no acute distress. HEENT: Conjunctivae and lids unremarkable. Cardiovascular: Regular rhythm.  Lungs: Normal work of breathing. Neurologic: No focal deficits.   Lab Results  Component Value Date   CREATININE 0.84 07/29/2019   BUN 13 07/29/2019   NA 143 07/29/2019   K 4.7 07/29/2019   CL 105 07/29/2019   CO2 26 07/29/2019   Lab Results  Component Value Date   ALT 19 07/29/2019   AST 18 07/29/2019   ALKPHOS 83 07/29/2019   BILITOT 0.2 07/29/2019   Lab Results  Component Value Date   HGBA1C 6.2 (H) 07/29/2019   HGBA1C 6.1 (H) 06/03/2019   HGBA1C 6.2 (H) 06/26/2018   HGBA1C 6.4 (H) 03/07/2018   HGBA1C 6.3 (H) 10/17/2017   Lab Results  Component Value Date   INSULIN 17.3 07/29/2019   INSULIN 30.0 (H) 06/26/2018   INSULIN 35.1 (H) 03/07/2018   INSULIN 25.3 (H) 10/17/2017   Lab Results  Component Value Date   TSH 2.350 07/29/2019   Lab Results  Component Value Date   CHOL 189 07/29/2019   HDL 43 07/29/2019   LDLCALC 125 (H) 07/29/2019   TRIG 119 07/29/2019   Lab Results  Component Value Date   WBC 5.1 07/29/2019   HGB 11.3 07/29/2019   HCT 35.2 07/29/2019   MCV 79 07/29/2019   PLT 216 07/29/2019   No results found for: IRON, TIBC, FERRITIN  Attestation Statements:   Reviewed by clinician on day of visit: allergies, medications, problem list, medical history, surgical history, family history, social history, and previous encounter notes.  Time spent on visit including pre-visit chart review and post-visit charting and care was 32 minutes.   IMichaelene Song, am acting as transcriptionist for Abby Potash, PA-C   I have reviewed the above documentation for accuracy and completeness, and I agree with the above. Abby Potash, PA-C

## 2019-09-26 ENCOUNTER — Ambulatory Visit (INDEPENDENT_AMBULATORY_CARE_PROVIDER_SITE_OTHER): Payer: 59 | Admitting: Physician Assistant

## 2019-12-23 IMAGING — NM NM HEPATO W/GB/PHARM/[PERSON_NAME]
3 series · 8 of 8 positions shown · non-contrast
Comparison: Abdominal ultrasound 04/21/2017

CLINICAL DATA: Right upper quadrant pain, nausea vomiting for 4
months.

EXAM:
NUCLEAR MEDICINE HEPATOBILIARY IMAGING WITH GALLBLADDER EF
TECHNIQUE: Sequential images of the abdomen were obtained [DATE] minutes
following intravenous administration of radiopharmaceutical. After
oral ingestion of Ensure, gallbladder ejection fraction was
determined. At 60 min, normal ejection fraction is greater than 33%.
RADIOPHARMACEUTICALS:  5.1 mCi [id number]  Choletec IV

[he hepatobiliary · 2.26mm/px · 1 of 1 slices shown (1 of 3)]
[im 1/1]
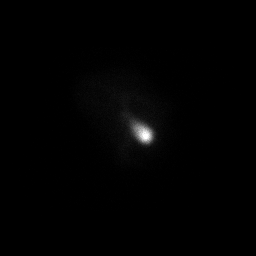

[he hepatobiliary · 3.10mm/px · 6 of 60 frames shown (2 of 3)]
[frame 6/60]
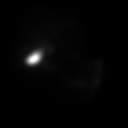
[frame 16/60]
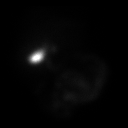
[frame 26/60]
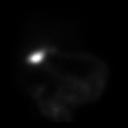
[frame 36/60]
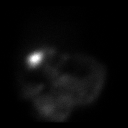
[frame 46/60]
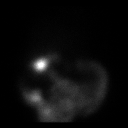
[frame 56/60]
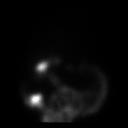

[he hepatobiliary · 2.26mm/px · 1 of 1 slices shown (3 of 3)]
[im 1/1]
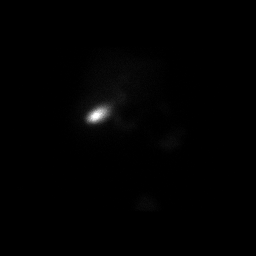

[8 of 8 positions shown; findings below may reference images not displayed]

FINDINGS: Prompt uptake and biliary excretion of activity by the liver is
seen. Gallbladder activity is visualized, consistent with patency of
cystic duct. Biliary activity passes into small bowel, consistent
with patent common bile duct.

Calculated gallbladder ejection fraction is 80 %. (Normal
gallbladder ejection fraction with Ensure is greater than 33%.)
IMPRESSION: Normal gallbladder function.

## 2020-04-07 ENCOUNTER — Other Ambulatory Visit: Payer: Self-pay | Admitting: Family Medicine

## 2020-04-07 DIAGNOSIS — E2839 Other primary ovarian failure: Secondary | ICD-10-CM

## 2021-03-10 ENCOUNTER — Other Ambulatory Visit: Payer: Self-pay | Admitting: Gastroenterology

## 2021-03-10 DIAGNOSIS — R109 Unspecified abdominal pain: Secondary | ICD-10-CM

## 2021-03-24 ENCOUNTER — Ambulatory Visit
Admission: RE | Admit: 2021-03-24 | Discharge: 2021-03-24 | Disposition: A | Discharge status: Home / Self Care | Payer: 59 | Source: Ambulatory Visit | Attending: Gastroenterology | Admitting: Gastroenterology

## 2021-03-24 ENCOUNTER — Other Ambulatory Visit: Payer: Self-pay

## 2021-03-24 DIAGNOSIS — R109 Unspecified abdominal pain: Secondary | ICD-10-CM

## 2021-03-24 MED ORDER — IOPAMIDOL (ISOVUE-300) INJECTION 61%
100.0000 mL | Freq: Once | INTRAVENOUS | Status: AC | PRN
  Administered 2021-03-24: 100 mL via INTRAVENOUS

## 2021-08-06 DIAGNOSIS — E119 Type 2 diabetes mellitus without complications: Secondary | ICD-10-CM | POA: Diagnosis not present

## 2021-08-06 DIAGNOSIS — G4489 Other headache syndrome: Secondary | ICD-10-CM | POA: Diagnosis not present

## 2021-08-06 DIAGNOSIS — K5900 Constipation, unspecified: Secondary | ICD-10-CM | POA: Diagnosis not present

## 2021-08-12 ENCOUNTER — Other Ambulatory Visit: Payer: Self-pay | Admitting: Family Medicine

## 2021-08-12 DIAGNOSIS — G4489 Other headache syndrome: Secondary | ICD-10-CM

## 2021-08-27 ENCOUNTER — Other Ambulatory Visit: Payer: Self-pay

## 2021-08-27 ENCOUNTER — Ambulatory Visit
Admission: RE | Admit: 2021-08-27 | Discharge: 2021-08-27 | Disposition: A | Discharge status: Home / Self Care | Payer: Medicare Other | Source: Ambulatory Visit | Attending: Family Medicine | Admitting: Family Medicine

## 2021-08-27 DIAGNOSIS — G4489 Other headache syndrome: Secondary | ICD-10-CM

## 2021-11-11 ENCOUNTER — Telehealth: Payer: Self-pay | Admitting: Gastroenterology

## 2021-11-11 NOTE — Telephone Encounter (Signed)
Good afternoon Dr. Loletha Carrow, ? ?This patient is requesting a transfer of care to you.  She has been seen at [place] and her records are being sent to you for your review.  Currently, she is having pain in her stomach under her rib cage and if she eats a heavy meal, within 30 minutes she has diarrhea.  Her father-in-law was seen by you and he recommended you to her as her doctor is or has retired. ? ?Please let me know if you approve the transfer. ? ?Thank you. ?

## 2021-11-15 NOTE — Telephone Encounter (Signed)
Request received to transfer GI care from outside practice to [facility].  We appreciate the interest in our practice, however at this time due to high demand from patients without established GI providers we cannot accommodate this transfer.  Ability to accommodate future transfer requests may change over time and the patient can contact us again in 6-12 months if still interested in being seen at [facility]. ? ?Therefore, I recommend she ask to be seen by one of the other [facility] GI physicians. ? ?-HD ? ?

## 2022-02-04 ENCOUNTER — Emergency Department (HOSPITAL_BASED_OUTPATIENT_CLINIC_OR_DEPARTMENT_OTHER): Payer: Medicare Other

## 2022-02-04 ENCOUNTER — Ambulatory Visit
Admission: EM | Admit: 2022-02-04 | Discharge: 2022-02-04 | Disposition: A | Discharge status: Home / Self Care | Payer: Medicare Other

## 2022-02-04 ENCOUNTER — Emergency Department (HOSPITAL_BASED_OUTPATIENT_CLINIC_OR_DEPARTMENT_OTHER)
Admission: EM | Admit: 2022-02-04 | Discharge: 2022-02-04 | Disposition: A | Discharge status: Home / Self Care | Payer: Medicare Other | Attending: Emergency Medicine | Admitting: Emergency Medicine

## 2022-02-04 ENCOUNTER — Encounter (HOSPITAL_BASED_OUTPATIENT_CLINIC_OR_DEPARTMENT_OTHER): Payer: Self-pay | Admitting: Emergency Medicine

## 2022-02-04 ENCOUNTER — Other Ambulatory Visit: Payer: Self-pay

## 2022-02-04 DIAGNOSIS — I4891 Unspecified atrial fibrillation: Secondary | ICD-10-CM | POA: Insufficient documentation

## 2022-02-04 DIAGNOSIS — R55 Syncope and collapse: Secondary | ICD-10-CM | POA: Diagnosis not present

## 2022-02-04 DIAGNOSIS — R0602 Shortness of breath: Secondary | ICD-10-CM | POA: Insufficient documentation

## 2022-02-04 DIAGNOSIS — J45909 Unspecified asthma, uncomplicated: Secondary | ICD-10-CM | POA: Diagnosis not present

## 2022-02-04 DIAGNOSIS — J4540 Moderate persistent asthma, uncomplicated: Secondary | ICD-10-CM

## 2022-02-04 DIAGNOSIS — I10 Essential (primary) hypertension: Secondary | ICD-10-CM

## 2022-02-04 DIAGNOSIS — E8881 Metabolic syndrome: Secondary | ICD-10-CM

## 2022-02-04 LAB — BASIC METABOLIC PANEL
Anion gap: 6 (ref 5–15)
BUN: 14 mg/dL (ref 8–23)
CO2: 29 mmol/L (ref 22–32)
Calcium: 8.5 mg/dL — ABNORMAL LOW (ref 8.9–10.3)
Chloride: 105 mmol/L (ref 98–111)
Creatinine, Ser: 0.92 mg/dL (ref 0.44–1.00)
GFR, Estimated: >60 mL/min (ref >60)
Glucose, Bld: 110 mg/dL — ABNORMAL HIGH (ref 70–99)
Potassium: 3.9 mmol/L (ref 3.5–5.1)
Sodium: 140 mmol/L (ref 135–145)

## 2022-02-04 LAB — BRAIN NATRIURETIC PEPTIDE: B Natriuretic Peptide: 50.7 pg/mL (ref 0.0–100.0)

## 2022-02-04 LAB — CBC
HCT: 40.4 % (ref 36.0–46.0)
Hemoglobin: 13.3 g/dL (ref 12.0–15.0)
MCH: 28.7 pg (ref 26.0–34.0)
MCHC: 32.9 g/dL (ref 30.0–36.0)
MCV: 87.1 fL (ref 80.0–100.0)
Platelets: 194 10*3/uL (ref 150–400)
RBC: 4.64 MIL/uL (ref 3.87–5.11)
RDW: 13.7 % (ref 11.5–15.5)
WBC: 7.7 10*3/uL (ref 4.0–10.5)
nRBC: 0 % (ref 0.0–0.2)

## 2022-02-04 LAB — MAGNESIUM: Magnesium: 2 mg/dL (ref 1.7–2.4)

## 2022-02-04 LAB — TROPONIN I (HIGH SENSITIVITY): Troponin I (High Sensitivity): 6 ng/L (ref <18)

## 2022-02-04 MED ORDER — METOPROLOL TARTRATE 25 MG PO TABS: 25.0000 mg | ORAL_TABLET | Freq: Two times a day (BID) | ORAL | 0 refills | Status: DC

## 2022-02-04 MED ORDER — METOPROLOL TARTRATE 25 MG PO TABS
25.0000 mg | ORAL_TABLET | Freq: Once | ORAL | Status: AC
  Administered 2022-02-04: 25 mg via ORAL
  Filled 2022-02-04: qty 1

## 2022-02-04 MED ORDER — APIXABAN 5 MG PO TABS: 5.0000 mg | ORAL_TABLET | Freq: Two times a day (BID) | ORAL | 0 refills | Status: DC

## 2022-02-04 MED ORDER — APIXABAN 2.5 MG PO TABS
5.0000 mg | ORAL_TABLET | Freq: Two times a day (BID) | ORAL | Status: DC
  Administered 2022-02-04: 5 mg via ORAL
  Filled 2022-02-04: qty 2

## 2022-02-04 NOTE — ED Triage Notes (Signed)
The patient states today around 4pm she began to have a asthma attack, she states she doesn't know if she fainted. She has used her albuterol inhaler 4 times, she states it helped somewhat.

## 2022-02-04 NOTE — Discharge Instructions (Signed)
You were seen in the emergency department today with atrial fibrillation.  We are starting you on a blood thinning medicine called Eliquis and metoprolol which helps to control your heart rate.  You cannot take aspirin or ibuprofen while taking Eliquis as this can increase your chance of bleeding.  If you fall and hit your head or involved in other trauma such as a car accident or experience bleeding you should present to the emergency department for evaluation.  I have called in a referral to the A-fib clinic and they should be reaching out to you for an appointment.  Please call for an appointment if you have not heard from them by Monday.  Return with any new or suddenly worsening symptoms.

## 2022-02-04 NOTE — ED Notes (Signed)
Patient is being discharged from the Urgent Care and sent to the Emergency Department via POA with family. Per L. Morgan-Scales PA-C , patient is in need of higher level of care due to need for further evaluation . Patient is aware and verbalizes understanding of plan of care.  Vitals:   02/04/22 1816  BP: (!) 168/92  Pulse: 94  Resp: 16  Temp: 98.3 F (36.8 C)  SpO2: 96%

## 2022-02-04 NOTE — ED Notes (Signed)
Dc instructions and scripts reviewed with pt no questions or concerns at this time.  

## 2022-02-04 NOTE — ED Provider Notes (Signed)
Emergency Department Provider Note   I have reviewed the triage vital signs and the nursing notes.   HISTORY  Chief Complaint Abnormal ECG   HPI Brandy Love is a 66 y.o. female presents to the ED from UC for evaluation of new a-fib. Patient was riding in the care today when she developed SOB symptoms and palpitations. No CP. She has a remote history of asthma but rarely requires MDI and notes that he medications are expired so she went to [facility] for a breathing treatment. No CP. She has had intermittent palpitations in the past but this is the most severe episode.     Past Medical History:  Diagnosis Date   Asthma    controlled well   Bladder prolapse, female, acquired    Edema extremities    lower legs at times   Gastric ulcer    GERD (gastroesophageal reflux disease)    Glaucoma    High blood pressure    OAB (overactive bladder)    Pre-diabetes    Prediabetes     Review of Systems  Constitutional: No fever/chills Eyes: No visual changes. ENT: No sore throat. Cardiovascular: Denies chest pain. Positive palpitations.  Respiratory: Positive shortness of breath. Gastrointestinal: No abdominal pain.  No nausea, no vomiting.  No diarrhea.  No constipation. Genitourinary: Negative for dysuria. Musculoskeletal: Negative for back pain. Skin: Negative for rash. Neurological: Negative for headaches, focal weakness or numbness.  ____________________________________________   PHYSICAL EXAM:  VITAL SIGNS: ED Triage Vitals  Enc Vitals Group     BP 02/04/22 1917 (!) 191/96     Pulse Rate 02/04/22 1917 (!) 103     Resp 02/04/22 1917 20     Temp 02/04/22 1917 98.3 F (36.8 C)     Temp Source 02/04/22 1917 Oral     SpO2 02/04/22 1917 99 %     Weight 02/04/22 1919 249 lb (112.9 kg)     Height 02/04/22 1919 '5\' 7"'$  (1.702 m)   Constitutional: Alert and oriented. Well appearing and in no acute distress. Eyes: Conjunctivae are normal. Head: Atraumatic. Nose: No  congestion/rhinnorhea. Mouth/Throat: Mucous membranes are moist.   Neck: No stridor.   Cardiovascular: A fib. Good peripheral circulation. Grossly normal heart sounds.   Respiratory: Normal respiratory effort.  No retractions. Lungs CTAB. Gastrointestinal: Soft and nontender. No distention.  Musculoskeletal: No lower extremity tenderness nor edema. No gross deformities of extremities. Neurologic:  Normal speech and language. No gross focal neurologic deficits are appreciated.  Skin:  Skin is warm, dry and intact. No rash noted.  ____________________________________________   LABS (all labs ordered are listed, but only abnormal results are displayed)  Labs Reviewed  BASIC METABOLIC PANEL - Abnormal; Notable for the following components:      Result Value   Glucose, Bld 110 (*)    Calcium 8.5 (*)    All other components within normal limits  CBC  BRAIN NATRIURETIC PEPTIDE  MAGNESIUM  TROPONIN I (HIGH SENSITIVITY)   ____________________________________________  EKG   EKG Interpretation  Date/Time:  Friday February 04 2022 19:16:16 EDT Ventricular Rate:  95 PR Interval:    QRS Duration: 92 QT Interval:  366 QTC Calculation: 459 R Axis:   46 Text Interpretation: Atrial fibrillation Possible Anterior infarct , age undetermined Abnormal ECG When compared with ECG of 04-Feb-2022 18:39, PREVIOUS ECG IS PRESENT Confirmed by Nanda Quinton [phone]) on 02/04/2022 7:19:46 PM        ____________________________________________  RADIOLOGY  DG Chest 2 View  Result Date: 02/04/2022 CLINICAL DATA:  Atrial fibrillation and shortness of breath EXAM: CHEST - 2 VIEW COMPARISON:  05/07/2014 FINDINGS: Cardiac shadow is within normal limits. The lungs are well aerated bilaterally. No focal infiltrate or effusion is seen. No acute bony abnormality is noted. IMPRESSION: No active cardiopulmonary disease. Electronically Signed   By: Inez Catalina M.D.   On: 02/04/2022 19:41     ____________________________________________   PROCEDURES  Procedure(s) performed:   Procedures  None ____________________________________________   INITIAL IMPRESSION / ASSESSMENT AND PLAN / ED COURSE  Pertinent labs & imaging results that were available during my care of the patient were reviewed by me and considered in my medical decision making (see chart for details).   This patient is Presenting for Evaluation of palpitations, which does require a range of treatment options, and is a complaint that involves a high risk of morbidity and mortality.  The Differential Diagnoses include arrhythmia, PE, ACS, CHF, electrolyte abnormality, etc.  Critical Interventions-    Medications  metoprolol tartrate (LOPRESSOR) tablet 25 mg (25 mg Oral Given 02/04/22 2035)    Reassessment after intervention: Rate well controlled.    I did obtai Additional Historical Information from husband at bedside.  I decided to review pertinent External Data, and in summary no prior history of a-fib noted.   Clinical Laboratory Tests Ordered, included troponin and BNP normal. No electrolyte disturbance on CMP. Mg normal. No anemia.   Radiologic Tests Ordered, included CXR. I independently interpreted the images and agree with radiology interpretation.   Cardiac Monitor Tracing which shows A fib - rate controlled.   Social Determinants of Health Risk patient is a non-smoker.    Medical Decision Making: Summary:  Patient presents to the ED with palpitations and found to be in a-fib. Rate controlled with metoprolol here. No absolute contraindications to anticoagulation. Discussed risk/benefit not rate/rhythm control and anticoagulation. Patient with palpitation episodes in the past and so not a candidate for ED cardioversion.   Reevaluation with update and discussion with patient. Tolerating metoprolol well. Considered admission but patient is rate controlled and can follow with the A-fib clinic  as an outpatient. Referral placed and strict ED return precautions discussed.    CHA2DS2-VASc Score =  3  This indicates a  3.2-4.6% annual risk of stroke. The patient's score is based upon: Age, sex, HTN history        Disposition: discharge  ____________________________________________  FINAL CLINICAL IMPRESSION(S) / ED DIAGNOSES  Final diagnoses:  Atrial fibrillation, unspecified type ([place])     NEW OUTPATIENT MEDICATIONS STARTED DURING THIS VISIT:  Discharge Medication List as of 02/04/2022  9:23 PM     START taking these medications   Details  apixaban (ELIQUIS) 5 MG TABS tablet Take 1 tablet (5 mg total) by mouth 2 (two) times daily., Starting Fri 02/04/2022, Until Sun 03/06/2022, Normal    metoprolol tartrate (LOPRESSOR) 25 MG tablet Take 1 tablet (25 mg total) by mouth 2 (two) times daily., Starting Fri 02/04/2022, Until Sun 03/06/2022, Normal        Note:  This document was prepared using Dragon voice recognition software and may include unintentional dictation errors.  Nanda Quinton, MD, [facility]    Jasemine Nawaz, [facility], MD 02/06/22 [phone]

## 2022-02-04 NOTE — ED Provider Notes (Addendum)
[facility]    CSN: [id number] Arrival date & time: 02/04/22  1758    HISTORY   Chief Complaint  Patient presents with   Asthma   HPI Brandy Love is a pleasant, 66 y.o. female who presents to urgent care. The patient states today around 4pm while riding in the car, she felt very cold so her husband turned the air conditioning off and then she became incredibly hot and began to feel like she was having an asthma attack.  Patient states she thinks she may have fainted but is not sure.  Patient states she used her albuterol inhaler 4 times which helped a little bit.     The history is provided by the patient and the spouse.   Past Medical History:  Diagnosis Date   Asthma    controlled well   Bladder prolapse, female, acquired    Edema extremities    lower legs at times   Gastric ulcer    GERD (gastroesophageal reflux disease)    Glaucoma    High blood pressure    OAB (overactive bladder)    Pre-diabetes    Prediabetes    Patient Active Problem List   Diagnosis Date Noted   Cystocele with prolapse 06/04/2019   Other fatigue 10/17/2017   Shortness of breath on exertion 10/17/2017   Essential hypertension 10/17/2017   Prediabetes 10/17/2017   CHRONIC RHINITIS 11/17/2008   COUGH 08/13/2008   GLAUCOMA, BILATERAL 07/18/2008   ASTHMA 07/18/2008   GERD 07/18/2008   Past Surgical History:  Procedure Laterality Date   ABDOMINAL HYSTERECTOMY  1993   ANTERIOR AND POSTERIOR REPAIR N/A 06/04/2019   Procedure: CYSTOSCOPY ANTERIOR (CYSTOCELE);  Surgeon: Bjorn Loser, MD;  Location: [facility];  Service: Urology;  Laterality: N/A;   APPENDECTOMY     CATARACT EXTRACTION  2016   CYSTECTOMY     1994 and 1982   INTERSTIM IMPLANT PLACEMENT  04/10/2012   Procedure: Barrie Lyme IMPLANT FIRST STAGE;  Surgeon: [name], MD;  [facility]: [facility];  Service: Urology;  Laterality: N/A;  rad tech ok per vicki at main    INTERSTIM IMPLANT PLACEMENT   04/10/2012   Procedure: Barrie Lyme IMPLANT SECOND STAGE;  Surgeon: [name], MD;  Location: [facility];  Service: Urology;  Laterality: N/A;   LOWER LEG SOFT TISSUE TUMOR EXCISION  1994   cyst left ankle -involved muscle removed-limited mobility now   NASAL SEPTUM SURGERY     PUBOVAGINAL SLING N/A 06/04/2019   Procedure: Gaynelle Arabian;  Surgeon: Bjorn Loser, MD;  Location: [facility];  Service: Urology;  Laterality: N/A;   OB History     Gravida  3   Para  3   Term      Preterm      AB      Living  3      SAB      IAB      Ectopic      Multiple      Live Births             Home Medications    Prior to Admission medications   Medication Sig Start Date End Date Taking? Authorizing Provider  albuterol (PROVENTIL HFA;VENTOLIN HFA) 108 (90 Base) MCG/ACT inhaler Inhale into the lungs every 6 (six) hours as needed for wheezing or shortness of breath.    [provider]  chlorthalidone (HYGROTON) 25 MG tablet Take 1 tablet (25 mg total) by mouth  daily. 12/27/18   Jearld Lesch A, DO  fluticasone (FLOVENT HFA) 110 MCG/ACT inhaler Inhale 2 puffs into the lungs 2 (two) times daily.    [provider]  losartan (COZAAR) 100 MG tablet Take 100 mg by mouth daily.    [provider]  omeprazole (PRILOSEC) 40 MG capsule Take by mouth.    [provider]  sucralfate (CARAFATE) 1 g tablet Take 1 g by mouth 3 (three) times daily. 11/16/21   [provider]  Vitamin D, Ergocalciferol, (DRISDOL) 1.25 MG (50000 UNIT) CAPS capsule TAKE 1 CAPSULE BY MOUTH EVERY 7 DAYS. 08/14/19   Abby Potash, PA-C    Allergies   Adhesive [tape], Erythromycin, Ibuprofen, Shellfish allergy, Amlodipine, Betadine [povidone iodine], and Povidone-iodine  Triage Vital Signs ED Triage Vitals  Enc Vitals Group     BP 05/07/21 0827 (!) 147/82     Pulse Rate 05/07/21 0827 72     Resp 05/07/21 0827 18     Temp 05/07/21 0827 98.3 F  (36.8 C)     Temp Source 05/07/21 0827 Oral     SpO2 05/07/21 0827 98 %     Weight --      Height --      Head Circumference --      Peak Flow --      Pain Score 05/07/21 0826 5     Pain Loc --      Pain Edu? --      Excl. in [place]? --   No data found.  Updated Vital Signs BP (!) 168/92 (BP Location: Right Arm)   Pulse 94   Temp 98.3 F (36.8 C) (Oral)   Resp 16   SpO2 96%   LIMITED PHYSICAL EXAM: On arrival, patient has a blood pressure of 168/92 and a heart rate of 94, oxygen saturation 96%.  EKG was performed on arrival revealing new onset atrial fibrillation without rapid ventricular response.   UC Couse / Diagnostics / Procedures:     Medications Ordered in UC: Medications - No data to display  UC Diagnoses / Final Clinical Impressions(s)   I have reviewed the triage vital signs and the nursing notes.  Pertinent labs & imaging results that were available during my care of the patient were reviewed by me and considered in my medical decision making (see chart for details).    Final diagnoses:  Syncope, unspecified syncope type  Atrial fibrillation, new onset ([place])  Essential hypertension  Morbid obesity ([facility])  Metabolic syndrome  Moderate persistent asthma without complication   Given patient's likely syncopal episode, based on her description, and new onset of atrial fibrillation as well as multiple comorbidities that put her at high risk for congestive heart failure, patient was advised to go to the main hospital emergency room as soon as possible for management of her new onset atrial fibrillation.    Per my observation, patient is mentating well during her visit today, O2 sats and respirations are normal.  I do not believe the patient needs to be transported via emergency vehicle at this time.  Patient advised and in agreement.  Patient's husband, who drove her to urgent care today, agrees to take her now.  ED Prescriptions   None    PDMP not reviewed this  encounter.  Disposition Upon Discharge:  Condition: stable for discharge Emergency Room via private vehicle operated by family member.  Patient and family member verbally agreed to go now  This office note has been dictated using  [employer] development officer.  Unfortunately, this method of dictation can sometimes lead to typographical or grammatical errors.  I apologize for your inconvenience in advance if this occurs.  Please do not hesitate to reach out to me if clarification is needed.      Lynden Oxford Scales, PA-C 02/04/22 1857    Lynden Oxford Scales, PA-C 02/04/22 1858    Lynden Oxford Scales, PA-C 02/04/22 1912

## 2022-02-04 NOTE — ED Triage Notes (Signed)
Pt seen at [facility] for SOB, pt thought was asthma and used an inhaler 4 times. UC preformed EKG shows Afib. Pt states this a new rhythm.

## 2022-02-15 ENCOUNTER — Ambulatory Visit (HOSPITAL_COMMUNITY)
Admission: RE | Admit: 2022-02-15 | Discharge: 2022-02-15 | Disposition: A | Discharge status: Home / Self Care | Payer: Medicare Other | Source: Ambulatory Visit | Attending: Nurse Practitioner | Admitting: Nurse Practitioner

## 2022-02-15 ENCOUNTER — Encounter (HOSPITAL_COMMUNITY): Payer: Self-pay | Admitting: Nurse Practitioner

## 2022-02-15 VITALS — BP 178/72 | HR 53 | Ht 67.0 in | Wt 242.6 lb

## 2022-02-15 DIAGNOSIS — I4891 Unspecified atrial fibrillation: Secondary | ICD-10-CM | POA: Diagnosis present

## 2022-02-15 DIAGNOSIS — D6869 Other thrombophilia: Secondary | ICD-10-CM

## 2022-02-15 DIAGNOSIS — I10 Essential (primary) hypertension: Secondary | ICD-10-CM | POA: Diagnosis not present

## 2022-02-15 MED ORDER — CHLORTHALIDONE 25 MG PO TABS: 25.0000 mg | ORAL_TABLET | ORAL | Status: DC | PRN

## 2022-02-15 MED ORDER — RIVAROXABAN 20 MG PO TABS: 20.0000 mg | ORAL_TABLET | Freq: Every day | ORAL | 3 refills | Status: DC

## 2022-02-15 NOTE — Progress Notes (Signed)
Primary Care Physician: Shirline Frees, MD Referring Physician: ED f/u    Brandy Love is a 66 y.o. female with a h/o new onset afib presented to the ED 02/04/22. She started having symptoms of feeling hot alternating with cold and short of breath. She felt  possibly her asthma was flaring. while riding in the car. She had noted some palpitations in the past but  not that severe. EKG showed afib with controlled rates at  91 bpm. She was started on metoprolol tartrate 25 mg bid with eliquis 5 mg bid with a CHA2DS2VASc  score of 3 and d/c home in afib.   Today, ekg shows SR. She  is taking her eliquis bid. No alcohol, moderate caffeine, no tobacco. Snores with probable apnea per husband. Obese with no exercise routine.    Today, she denies symptoms of palpitations, chest pain, shortness of breath, orthopnea, PND, lower extremity edema, dizziness, presyncope, syncope, or neurologic sequela. The patient is tolerating medications without difficulties and is otherwise without complaint today.   Past Medical History:  Diagnosis Date   Asthma    controlled well   Bladder prolapse, female, acquired    Edema extremities    lower legs at times   Gastric ulcer    GERD (gastroesophageal reflux disease)    Glaucoma    High blood pressure    OAB (overactive bladder)    Pre-diabetes    Prediabetes    Past Surgical History:  Procedure Laterality Date   ABDOMINAL HYSTERECTOMY  1993   ANTERIOR AND POSTERIOR REPAIR N/A 06/04/2019   Procedure: CYSTOSCOPY ANTERIOR (CYSTOCELE);  Surgeon: Bjorn Loser, MD;  Location: [facility];  Service: Urology;  Laterality: N/A;   APPENDECTOMY     CATARACT EXTRACTION  2016   CYSTECTOMY     1994 and 1982   INTERSTIM IMPLANT PLACEMENT  04/10/2012   Procedure: Barrie Lyme IMPLANT FIRST STAGE;  Surgeon: [name], MD;  Location: [facility];  Service: Urology;  Laterality: N/A;  rad tech ok per vicki at [facility]    INTERSTIM IMPLANT PLACEMENT   04/10/2012   Procedure: Barrie Lyme IMPLANT SECOND STAGE;  Surgeon: [name], MD;  Location: [facility];  Service: Urology;  Laterality: N/A;   LOWER LEG SOFT TISSUE TUMOR EXCISION  1994   cyst left ankle -involved muscle removed-limited mobility now   NASAL SEPTUM SURGERY     PUBOVAGINAL SLING N/A 06/04/2019   Procedure: Gaynelle Arabian;  Surgeon: Bjorn Loser, MD;  Location: [facility];  Service: Urology;  Laterality: N/A;    Current Outpatient Medications  Medication Sig Dispense Refill   albuterol (PROVENTIL HFA;VENTOLIN HFA) 108 (90 Base) MCG/ACT inhaler Inhale into the lungs every 6 (six) hours as needed for wheezing or shortness of breath.     clotrimazole-betamethasone (LOTRISONE) cream Apply topically 2 (two) times daily as needed.     fluticasone (FLOVENT HFA) 110 MCG/ACT inhaler Inhale 2 puffs into the lungs 2 (two) times daily.     hyoscyamine (LEVSIN SL) 0.125 MG SL tablet Place under the tongue 2 (two) times daily as needed.     losartan (COZAAR) 100 MG tablet Take 100 mg by mouth daily.     metoprolol tartrate (LOPRESSOR) 25 MG tablet Take 1 tablet (25 mg total) by mouth 2 (two) times daily. 60 tablet 0   omeprazole (PRILOSEC) 40 MG capsule Take 40 mg by mouth daily.     rivaroxaban (XARELTO) 20 MG TABS tablet Take 1 tablet (20  mg total) by mouth daily with supper. 30 tablet 3   sucralfate (CARAFATE) 1 g tablet Take 1 g by mouth as needed.     triamcinolone (KENALOG) 0.1 % paste 2 (two) times daily as needed.     chlorthalidone (HYGROTON) 25 MG tablet Take 1 tablet (25 mg total) by mouth as needed.     No current facility-administered medications for this encounter.    Allergies  Allergen Reactions   Adhesive [Tape] Other (See Comments)    Rash with transderm   Erythromycin Hives   Ibuprofen     REACTION: vertigo   Shellfish Allergy Nausea Only and Swelling    Swelling of throat   Silicone Other (See Comments)    Rash with transderm    Amlodipine Hives, Swelling and Rash   Betadine [Povidone Iodine] Rash     ALL TOPICAL IODINES   Povidone-Iodine Rash    ALL TOPICAL IODINES    Social History   Socioeconomic History   Marital status: Married    Spouse name: Armed forces training and education officer   Number of children: 3   Years of education: Not on file   Highest education level: Not on file  Occupational History   Occupation: Building services engineer    Occupation: Retired from [employer] Use   Smoking status: Never   Smokeless tobacco: Never  Vaping Use   Vaping Use: Never used  Substance and Sexual Activity   Alcohol use: No   Drug use: No   Sexual activity: Not on file  Other Topics Concern   Not on file  Social History Narrative   Not on file   Social Determinants of Health   Financial Resource Strain: Not on file  Food Insecurity: Not on file  Transportation Needs: Not on file  Physical Activity: Not on file  Stress: Not on file  Social Connections: Not on file  Intimate Partner Violence: Not on file    Family History  Problem Relation Age of Onset   Asthma Mother    Diabetes Mother    Hernia Mother    Obesity Mother    Heart disease Father    Prostate cancer Father     ROS- All systems are reviewed and negative except as per the HPI above  Physical Exam: Vitals:   02/15/22 0904  BP: (!) 178/72  Pulse: (!) 53  Weight: 110 kg  Height: '5\' 7"'$  (1.702 m)   Wt Readings from Last 3 Encounters:  02/15/22 110 kg  02/04/22 112.9 kg  09/05/19 116.6 kg    Labs: Lab Results  Component Value Date   NA 140 02/04/2022   K 3.9 02/04/2022   CL 105 02/04/2022   CO2 29 02/04/2022   GLUCOSE 110 (H) 02/04/2022   BUN 14 02/04/2022   CREATININE 0.92 02/04/2022   CALCIUM 8.5 (L) 02/04/2022   MG 2.0 02/04/2022   Lab Results  Component Value Date   INR 1.0 06/03/2019   Lab Results  Component Value Date   CHOL 189 07/29/2019   HDL 43 07/29/2019   LDLCALC 125 (H) 07/29/2019   TRIG 119 07/29/2019      GEN- The patient is well appearing, alert and oriented x 3 today.   Head- normocephalic, atraumatic Eyes-  Sclera clear, conjunctiva pink Ears- hearing intact Oropharynx- clear Neck- supple, no JVP Lymph- no cervical lymphadenopathy Lungs- Clear to ausculation bilaterally, normal work of breathing Heart- Regular rate and rhythm, no murmurs, rubs or gallops, PMI not laterally displaced GI- soft, NT,  ND, + BS Extremities- no clubbing, cyanosis, or edema MS- no significant deformity or atrophy Skin- no rash or lesion Psych- euthymic mood, full affect Neuro- strength and sensation are intact  EKG-Vent. rate 53 BPM PR interval 242 ms QRS duration 90 ms QT/QTcB 438/410 ms P-R-T axes 46 59 24 Sinus bradycardia with 1st degree A-V block Otherwise normal ECG When compared with ECG of 04-Feb-2022 19:16, PREVIOUS ECG IS PRESENT   Assessment and Plan:  1. New onset afib  In SR today general education re afib Triggers discussed  Echo ordered Sleep study ordered  2 week monitor to further understand afib burden  Continue metoprolol tartrate 25 mg bid for rate control   2. CHA2DS2VASc  score of 3 Pt c/o H/A's since starting eliquis 5 mg bid Will try to change to xarelto 20 mg daily to see if h/a's resolve   3. HTN Elevated today Go home and check later and let office know if continues to be elevated, she had not taken am BB so she will take it now in the office   4. BMI of 38 Diet modification for weight loss and regular exercise encouraged   F/u in afib clinic in one month  Butch Penny C. Daryus Sowash, [facility] [address] [place], [place] [address] [phone]

## 2022-02-15 NOTE — Patient Instructions (Signed)
Stop Eliquis   Start Xarelto '20mg'$  ONCE A DAY WITH SUPPER

## 2022-02-16 ENCOUNTER — Encounter (INDEPENDENT_AMBULATORY_CARE_PROVIDER_SITE_OTHER): Payer: Self-pay

## 2022-03-02 ENCOUNTER — Ambulatory Visit (HOSPITAL_COMMUNITY)
Admission: RE | Admit: 2022-03-02 | Discharge: 2022-03-02 | Disposition: A | Discharge status: Home / Self Care | Payer: Medicare Other | Source: Ambulatory Visit | Attending: Nurse Practitioner | Admitting: Nurse Practitioner

## 2022-03-02 ENCOUNTER — Encounter (HOSPITAL_COMMUNITY): Payer: Self-pay | Admitting: *Deleted

## 2022-03-02 DIAGNOSIS — I4891 Unspecified atrial fibrillation: Secondary | ICD-10-CM | POA: Diagnosis present

## 2022-03-02 LAB — ECHOCARDIOGRAM COMPLETE
AR max vel: 2.3 cm2
AV Area VTI: 2.22 cm2
AV Area mean vel: 2.1 cm2
AV Mean grad: 5 mmHg
AV Peak grad: 8.9 mmHg
Ao pk vel: 1.49 m/s
Area-P 1/2: 3.79 cm2
S' Lateral: 4.1 cm

## 2022-03-02 NOTE — Progress Notes (Signed)
  Echocardiogram 2D Echocardiogram has been performed.  Brandy Love 03/02/2022, 10:52 AM

## 2022-03-07 ENCOUNTER — Other Ambulatory Visit (HOSPITAL_COMMUNITY): Payer: Self-pay | Admitting: *Deleted

## 2022-03-07 DIAGNOSIS — I480 Paroxysmal atrial fibrillation: Secondary | ICD-10-CM

## 2022-03-07 MED ORDER — METOPROLOL TARTRATE 25 MG PO TABS: 25.0000 mg | ORAL_TABLET | Freq: Two times a day (BID) | ORAL | 3 refills | Status: DC

## 2022-03-15 ENCOUNTER — Ambulatory Visit (HOSPITAL_COMMUNITY)
Admission: RE | Admit: 2022-03-15 | Discharge: 2022-03-15 | Disposition: A | Discharge status: Home / Self Care | Payer: Medicare Other | Source: Ambulatory Visit | Attending: Nurse Practitioner | Admitting: Nurse Practitioner

## 2022-03-15 VITALS — BP 186/88 | HR 57 | Ht 67.0 in | Wt 239.0 lb

## 2022-03-15 DIAGNOSIS — Z7901 Long term (current) use of anticoagulants: Secondary | ICD-10-CM | POA: Insufficient documentation

## 2022-03-15 DIAGNOSIS — I480 Paroxysmal atrial fibrillation: Secondary | ICD-10-CM

## 2022-03-15 DIAGNOSIS — Z8249 Family history of ischemic heart disease and other diseases of the circulatory system: Secondary | ICD-10-CM | POA: Diagnosis not present

## 2022-03-15 DIAGNOSIS — I10 Essential (primary) hypertension: Secondary | ICD-10-CM | POA: Insufficient documentation

## 2022-03-15 DIAGNOSIS — J45909 Unspecified asthma, uncomplicated: Secondary | ICD-10-CM | POA: Diagnosis not present

## 2022-03-15 DIAGNOSIS — Z79899 Other long term (current) drug therapy: Secondary | ICD-10-CM | POA: Insufficient documentation

## 2022-03-15 LAB — CBC
HCT: 38.3 % (ref 36.0–46.0)
Hemoglobin: 12.9 g/dL (ref 12.0–15.0)
MCH: 29 pg (ref 26.0–34.0)
MCHC: 33.7 g/dL (ref 30.0–36.0)
MCV: 86.1 fL (ref 80.0–100.0)
Platelets: 162 10*3/uL (ref 150–400)
RBC: 4.45 MIL/uL (ref 3.87–5.11)
RDW: 13.3 % (ref 11.5–15.5)
WBC: 4.8 10*3/uL (ref 4.0–10.5)
nRBC: 0 % (ref 0.0–0.2)

## 2022-03-15 LAB — BASIC METABOLIC PANEL
Anion gap: 7 (ref 5–15)
BUN: 10 mg/dL (ref 8–23)
CO2: 25 mmol/L (ref 22–32)
Calcium: 9 mg/dL (ref 8.9–10.3)
Chloride: 108 mmol/L (ref 98–111)
Creatinine, Ser: 1 mg/dL (ref 0.44–1.00)
GFR, Estimated: >60 mL/min (ref >60)
Glucose, Bld: 132 mg/dL — ABNORMAL HIGH (ref 70–99)
Potassium: 4.1 mmol/L (ref 3.5–5.1)
Sodium: 140 mmol/L (ref 135–145)

## 2022-03-15 NOTE — Addendum Note (Signed)
Encounter addended by: Sherran Needs, NP on: 03/15/2022 9:48 AM  Actions taken: Clinical Note Signed

## 2022-03-15 NOTE — Progress Notes (Addendum)
Primary Care Physician: Shirline Frees, MD Referring Physician: ED f/u    Brandy Love is a 66 y.o. female with a h/o new onset afib presented to the ED 02/04/22. She started having symptoms of feeling hot alternating with cold and short of breath. She felt  possibly her asthma was flaring while riding in the car. She had noted some palpitations in the past but  not that severe. EKG showed afib with controlled rates at  91 bpm. She was started on metoprolol tartrate 25 mg bid with eliquis 5 mg bid with a CHA2DS2VASc  score of 3 and d/c home in afib.   Today, ekg shows SR. She  is taking her eliquis bid. No alcohol, moderate caffeine, no tobacco. Snores with probable apnea per husband. Obese with no exercise routine.    F/u in afib clinic, 03/15/22. I reviewed echo and monitor with pt. She had 2% afib burden and 1.8% sinus rhythm with PAC's, which was what pt triggered on  monitor.   Today, she denies symptoms of palpitations, chest pain, shortness of breath, orthopnea, PND, lower extremity edema, dizziness, presyncope, syncope, or neurologic sequela. The patient is tolerating medications without difficulties and is otherwise without complaint today.   Past Medical History:  Diagnosis Date   Asthma    controlled well   Bladder prolapse, female, acquired    Edema extremities    lower legs at times   Gastric ulcer    GERD (gastroesophageal reflux disease)    Glaucoma    High blood pressure    OAB (overactive bladder)    Pre-diabetes    Prediabetes    Past Surgical History:  Procedure Laterality Date   ABDOMINAL HYSTERECTOMY  1993   ANTERIOR AND POSTERIOR REPAIR N/A 06/04/2019   Procedure: CYSTOSCOPY ANTERIOR (CYSTOCELE);  Surgeon: Bjorn Loser, MD;  Location: [facility];  Service: Urology;  Laterality: N/A;   APPENDECTOMY     CATARACT EXTRACTION  2016   CYSTECTOMY     1994 and 1982   INTERSTIM IMPLANT PLACEMENT  04/10/2012   Procedure: Barrie Lyme IMPLANT FIRST STAGE;  Surgeon:  [name], MD;  Location: [facility];  Service: Urology;  Laterality: N/A;  rad tech ok per vicki at [facility]    INTERSTIM IMPLANT PLACEMENT  04/10/2012   Procedure: Barrie Lyme IMPLANT SECOND STAGE;  Surgeon: [name], MD;  Location: [facility];  Service: Urology;  Laterality: N/A;   LOWER LEG SOFT TISSUE TUMOR EXCISION  1994   cyst left ankle -involved muscle removed-limited mobility now   NASAL SEPTUM SURGERY     PUBOVAGINAL SLING N/A 06/04/2019   Procedure: Gaynelle Arabian;  Surgeon: Bjorn Loser, MD;  Location: [facility];  Service: Urology;  Laterality: N/A;    Current Outpatient Medications  Medication Sig Dispense Refill   albuterol (PROVENTIL HFA;VENTOLIN HFA) 108 (90 Base) MCG/ACT inhaler Inhale into the lungs every 6 (six) hours as needed for wheezing or shortness of breath.     chlorthalidone (HYGROTON) 25 MG tablet Take 1 tablet (25 mg total) by mouth as needed.     clotrimazole-betamethasone (LOTRISONE) cream Apply topically 2 (two) times daily as needed.     fluticasone (FLOVENT HFA) 110 MCG/ACT inhaler Inhale 2 puffs into the lungs 2 (two) times daily.     hyoscyamine (LEVSIN SL) 0.125 MG SL tablet Place under the tongue 2 (two) times daily as needed.     losartan (COZAAR) 100 MG tablet Take 100 mg by mouth daily.  metoprolol tartrate (LOPRESSOR) 25 MG tablet Take 1 tablet (25 mg total) by mouth 2 (two) times daily. 60 tablet 3   omeprazole (PRILOSEC) 40 MG capsule Take 40 mg by mouth daily.     rivaroxaban (XARELTO) 20 MG TABS tablet Take 1 tablet (20 mg total) by mouth daily with supper. 30 tablet 3   sucralfate (CARAFATE) 1 g tablet Take 1 g by mouth as needed.     triamcinolone (KENALOG) 0.1 % paste 2 (two) times daily as needed.     No current facility-administered medications for this encounter.    Allergies  Allergen Reactions   Adhesive [Tape] Other (See Comments)    Rash with transderm   Erythromycin Hives    Ibuprofen     REACTION: vertigo   Shellfish Allergy Nausea Only and Swelling    Swelling of throat   Silicone Other (See Comments)    Rash with transderm   Amlodipine Hives, Swelling and Rash   Betadine [Povidone Iodine] Rash     ALL TOPICAL IODINES   Povidone-Iodine Rash    ALL TOPICAL IODINES    Social History   Socioeconomic History   Marital status: Married    Spouse name: Armed forces training and education officer   Number of children: 3   Years of education: Not on file   Highest education level: Not on file  Occupational History   Occupation: Building services engineer    Occupation: Retired from [employer] Use   Smoking status: Never   Smokeless tobacco: Never  Scientific laboratory technician Use: Never used  Substance and Sexual Activity   Alcohol use: No   Drug use: No   Sexual activity: Not on file  Other Topics Concern   Not on file  Social History Narrative   Not on file   Social Determinants of Health   Financial Resource Strain: Not on file  Food Insecurity: Not on file  Transportation Needs: Not on file  Physical Activity: Not on file  Stress: Not on file  Social Connections: Not on file  Intimate Partner Violence: Not on file    Family History  Problem Relation Age of Onset   Asthma Mother    Diabetes Mother    Hernia Mother    Obesity Mother    Heart disease Father    Prostate cancer Father     ROS- All systems are reviewed and negative except as per the HPI above  Physical Exam: There were no vitals filed for this visit.  Wt Readings from Last 3 Encounters:  02/15/22 110 kg  02/04/22 112.9 kg  09/05/19 116.6 kg    Labs: Lab Results  Component Value Date   NA 140 02/04/2022   K 3.9 02/04/2022   CL 105 02/04/2022   CO2 29 02/04/2022   GLUCOSE 110 (H) 02/04/2022   BUN 14 02/04/2022   CREATININE 0.92 02/04/2022   CALCIUM 8.5 (L) 02/04/2022   MG 2.0 02/04/2022   Lab Results  Component Value Date   INR 1.0 06/03/2019   Lab Results  Component Value Date    CHOL 189 07/29/2019   HDL 43 07/29/2019   LDLCALC 125 (H) 07/29/2019   TRIG 119 07/29/2019     GEN- The patient is well appearing, alert and oriented x 3 today.   Head- normocephalic, atraumatic Eyes-  Sclera clear, conjunctiva pink Ears- hearing intact Oropharynx- clear Neck- supple, no JVP Lymph- no cervical lymphadenopathy Lungs- Clear to ausculation bilaterally, normal work of breathing Heart- Regular rate and  rhythm, no murmurs, rubs or gallops, PMI not laterally displaced GI- soft, NT, ND, + BS Extremities- no clubbing, cyanosis, or edema MS- no significant deformity or atrophy Skin- no rash or lesion Psych- euthymic mood, full affect Neuro- strength and sensation are intact  EKG-Vent. rate 57 BPM PR interval 250 ms QRS duration 92 ms QT/QTcB 412/401 ms P-R-T axes 80 56 46 Sinus bradycardia with 1st degree A-V block Otherwise normal ECG When compared with ECG of 15-Feb-2022 09:18, PREVIOUS ECG IS PRESENT  Echo-1. Left ventricular ejection fraction, by estimation, is 55 to 60%. The  left ventricle has normal function. The left ventricle has no regional  wall motion abnormalities. Left ventricular diastolic parameters are  consistent with Grade I diastolic  dysfunction (impaired relaxation).   2. Right ventricular systolic function is normal. The right ventricular  size is normal. Tricuspid regurgitation signal is inadequate for assessing  PA pressure.   3. Left atrial size was mild to moderately dilated.   4. The mitral valve is normal in structure. Mild to moderate mitral valve  regurgitation.   5. The aortic valve is tricuspid. Aortic valve regurgitation is not  visualized. No aortic stenosis is present.   6. The inferior vena cava is normal in size with greater than 50%  respiratory variability, suggesting right atrial pressure of 3 mmHg.   Zio patch-Patch Wear Time:  14 days and 0 hours    Predominant rhythm was sinus rhythm 2% atrial fibrillation  burden 1.8% supraventricular ectopy Less than 1% ventricular ectopy Triggered episodes associated with both sinus rhythm and sinus rhythm with PACs  Assessment and Plan:  1. New onset afib  In SR today general education re afib Triggers discussed  Echo reviewed  Sleep study ordered  We discussed multaq to minimize arrhythmia Continue metoprolol tartrate 25 mg bid for rate control   2. CHA2DS2VASc  score of 3 Pt c/o H/A's since starting eliquis 5 mg bid Xarelto 20 mg daily now with resolve of ha's  3. HTN Elevated today Recheck 158/88 She was encouraged to buy a BP cuff and start checking home BP's  to see overall management, keep log and we will review at next visit  Avoid salt   4. BMI of 38 Diet modification for weight loss and regular exercise encouraged  She was part of [place] weight management program in the past and will see if she can ger restarted there   F/u per what pt finds out re [employer] C. Sophia Sperry, [facility] [address] [place], [place] [address] [phone]

## 2022-03-17 ENCOUNTER — Ambulatory Visit (HOSPITAL_COMMUNITY): Payer: Medicare Other | Admitting: Nurse Practitioner

## 2022-03-17 ENCOUNTER — Encounter (HOSPITAL_COMMUNITY): Payer: Self-pay

## 2022-03-17 ENCOUNTER — Other Ambulatory Visit (HOSPITAL_COMMUNITY): Payer: Self-pay | Admitting: *Deleted

## 2022-03-17 MED ORDER — RIVAROXABAN 20 MG PO TABS: 20.0000 mg | ORAL_TABLET | Freq: Every day | ORAL | 1 refills | Status: DC

## 2022-03-17 MED ORDER — MULTAQ 400 MG PO TABS: 400.0000 mg | ORAL_TABLET | Freq: Two times a day (BID) | ORAL | 1 refills | Status: DC

## 2022-03-23 ENCOUNTER — Other Ambulatory Visit (HOSPITAL_COMMUNITY): Payer: Self-pay | Admitting: *Deleted

## 2022-03-23 MED ORDER — METOPROLOL TARTRATE 25 MG PO TABS
12.5000 mg | ORAL_TABLET | Freq: Two times a day (BID) | ORAL | 3 refills | Status: DC
Start: 2022-03-23 — End: 2022-06-08

## 2022-04-04 ENCOUNTER — Ambulatory Visit (HOSPITAL_COMMUNITY)
Admission: RE | Admit: 2022-04-04 | Discharge: 2022-04-04 | Disposition: A | Discharge status: Home / Self Care | Payer: Medicare Other | Source: Ambulatory Visit | Attending: Physician Assistant | Admitting: Physician Assistant

## 2022-04-04 VITALS — HR 53

## 2022-04-04 DIAGNOSIS — I480 Paroxysmal atrial fibrillation: Secondary | ICD-10-CM | POA: Diagnosis present

## 2022-04-04 NOTE — Progress Notes (Signed)
Patient returns for ECG after starting Multaq. ECG shows SB HR 53, 1st degree AV block, PR 270, QRS 98, QTc 416. Patient has not had any "heart pounding" since starting the medication. Continue Multaq at present dose. Will need to watch PR interval closely. F/u with Roderic Palau in 6-8 weeks.

## 2022-04-10 ENCOUNTER — Ambulatory Visit (HOSPITAL_BASED_OUTPATIENT_CLINIC_OR_DEPARTMENT_OTHER): Payer: Medicare Other | Attending: Physician Assistant | Admitting: Cardiology

## 2022-04-10 DIAGNOSIS — I480 Paroxysmal atrial fibrillation: Secondary | ICD-10-CM

## 2022-04-10 DIAGNOSIS — R0683 Snoring: Secondary | ICD-10-CM

## 2022-04-13 ENCOUNTER — Emergency Department (HOSPITAL_COMMUNITY): Payer: Medicare Other

## 2022-04-13 ENCOUNTER — Ambulatory Visit
Admission: EM | Admit: 2022-04-13 | Discharge: 2022-04-13 | Disposition: A | Discharge status: Home / Self Care | Payer: Medicare Other | Attending: Urgent Care | Admitting: Urgent Care

## 2022-04-13 ENCOUNTER — Other Ambulatory Visit: Payer: Self-pay

## 2022-04-13 ENCOUNTER — Inpatient Hospital Stay (HOSPITAL_COMMUNITY)
Admission: EM | Admit: 2022-04-13 | Discharge: 2022-04-15 | DRG: 065 | Disposition: A | Discharge status: Home / Self Care | Payer: Medicare Other | Source: Ambulatory Visit | Attending: Internal Medicine | Admitting: Internal Medicine

## 2022-04-13 ENCOUNTER — Encounter (HOSPITAL_COMMUNITY): Payer: Self-pay | Admitting: Emergency Medicine

## 2022-04-13 DIAGNOSIS — R29810 Facial weakness: Secondary | ICD-10-CM | POA: Diagnosis present

## 2022-04-13 DIAGNOSIS — F419 Anxiety disorder, unspecified: Secondary | ICD-10-CM | POA: Diagnosis present

## 2022-04-13 DIAGNOSIS — J45909 Unspecified asthma, uncomplicated: Secondary | ICD-10-CM | POA: Diagnosis present

## 2022-04-13 DIAGNOSIS — I4891 Unspecified atrial fibrillation: Secondary | ICD-10-CM

## 2022-04-13 DIAGNOSIS — Z91013 Allergy to seafood: Secondary | ICD-10-CM

## 2022-04-13 DIAGNOSIS — Z9682 Presence of neurostimulator: Secondary | ICD-10-CM

## 2022-04-13 DIAGNOSIS — M25512 Pain in left shoulder: Secondary | ICD-10-CM | POA: Diagnosis present

## 2022-04-13 DIAGNOSIS — I639 Cerebral infarction, unspecified: Secondary | ICD-10-CM | POA: Diagnosis present

## 2022-04-13 DIAGNOSIS — G9340 Encephalopathy, unspecified: Secondary | ICD-10-CM

## 2022-04-13 DIAGNOSIS — Z7982 Long term (current) use of aspirin: Secondary | ICD-10-CM

## 2022-04-13 DIAGNOSIS — I69354 Hemiplegia and hemiparesis following cerebral infarction affecting left non-dominant side: Secondary | ICD-10-CM

## 2022-04-13 DIAGNOSIS — R7303 Prediabetes: Secondary | ICD-10-CM | POA: Diagnosis present

## 2022-04-13 DIAGNOSIS — I6381 Other cerebral infarction due to occlusion or stenosis of small artery: Secondary | ICD-10-CM | POA: Diagnosis not present

## 2022-04-13 DIAGNOSIS — R202 Paresthesia of skin: Secondary | ICD-10-CM | POA: Diagnosis not present

## 2022-04-13 DIAGNOSIS — R29898 Other symptoms and signs involving the musculoskeletal system: Principal | ICD-10-CM

## 2022-04-13 DIAGNOSIS — Z7901 Long term (current) use of anticoagulants: Secondary | ICD-10-CM

## 2022-04-13 DIAGNOSIS — Z91041 Radiographic dye allergy status: Secondary | ICD-10-CM

## 2022-04-13 DIAGNOSIS — I10 Essential (primary) hypertension: Secondary | ICD-10-CM | POA: Diagnosis present

## 2022-04-13 DIAGNOSIS — Z9849 Cataract extraction status, unspecified eye: Secondary | ICD-10-CM

## 2022-04-13 DIAGNOSIS — K219 Gastro-esophageal reflux disease without esophagitis: Secondary | ICD-10-CM | POA: Diagnosis present

## 2022-04-13 DIAGNOSIS — T45516A Underdosing of anticoagulants, initial encounter: Secondary | ICD-10-CM | POA: Diagnosis present

## 2022-04-13 DIAGNOSIS — Z888 Allergy status to other drugs, medicaments and biological substances status: Secondary | ICD-10-CM

## 2022-04-13 DIAGNOSIS — Z79899 Other long term (current) drug therapy: Secondary | ICD-10-CM

## 2022-04-13 DIAGNOSIS — Z91048 Other nonmedicinal substance allergy status: Secondary | ICD-10-CM

## 2022-04-13 DIAGNOSIS — Z7902 Long term (current) use of antithrombotics/antiplatelets: Secondary | ICD-10-CM

## 2022-04-13 DIAGNOSIS — Z881 Allergy status to other antibiotic agents status: Secondary | ICD-10-CM

## 2022-04-13 DIAGNOSIS — Z90710 Acquired absence of both cervix and uterus: Secondary | ICD-10-CM

## 2022-04-13 DIAGNOSIS — E669 Obesity, unspecified: Secondary | ICD-10-CM | POA: Diagnosis present

## 2022-04-13 DIAGNOSIS — N8110 Cystocele, unspecified: Secondary | ICD-10-CM | POA: Diagnosis present

## 2022-04-13 DIAGNOSIS — Z6836 Body mass index (BMI) 36.0-36.9, adult: Secondary | ICD-10-CM

## 2022-04-13 DIAGNOSIS — H409 Unspecified glaucoma: Secondary | ICD-10-CM | POA: Diagnosis present

## 2022-04-13 DIAGNOSIS — Z8379 Family history of other diseases of the digestive system: Secondary | ICD-10-CM

## 2022-04-13 DIAGNOSIS — Z8711 Personal history of peptic ulcer disease: Secondary | ICD-10-CM

## 2022-04-13 DIAGNOSIS — R29701 NIHSS score 1: Secondary | ICD-10-CM | POA: Diagnosis present

## 2022-04-13 DIAGNOSIS — Z8042 Family history of malignant neoplasm of prostate: Secondary | ICD-10-CM

## 2022-04-13 DIAGNOSIS — Z886 Allergy status to analgesic agent status: Secondary | ICD-10-CM

## 2022-04-13 DIAGNOSIS — M549 Dorsalgia, unspecified: Secondary | ICD-10-CM | POA: Diagnosis present

## 2022-04-13 DIAGNOSIS — Z882 Allergy status to sulfonamides status: Secondary | ICD-10-CM

## 2022-04-13 DIAGNOSIS — I480 Paroxysmal atrial fibrillation: Secondary | ICD-10-CM | POA: Diagnosis present

## 2022-04-13 DIAGNOSIS — Z8249 Family history of ischemic heart disease and other diseases of the circulatory system: Secondary | ICD-10-CM

## 2022-04-13 DIAGNOSIS — Z825 Family history of asthma and other chronic lower respiratory diseases: Secondary | ICD-10-CM

## 2022-04-13 DIAGNOSIS — Z8489 Family history of other specified conditions: Secondary | ICD-10-CM

## 2022-04-13 DIAGNOSIS — Z833 Family history of diabetes mellitus: Secondary | ICD-10-CM

## 2022-04-13 DIAGNOSIS — Z87448 Personal history of other diseases of urinary system: Secondary | ICD-10-CM

## 2022-04-13 HISTORY — DX: Unspecified atrial fibrillation: I48.91

## 2022-04-13 LAB — CBC WITH DIFFERENTIAL/PLATELET
Abs Immature Granulocytes: 0.02 10*3/uL (ref 0.00–0.07)
Basophils Absolute: 0.1 10*3/uL (ref 0.0–0.1)
Basophils Relative: 1 %
Eosinophils Absolute: 0.2 10*3/uL (ref 0.0–0.5)
Eosinophils Relative: 3 %
HCT: 41.2 % (ref 36.0–46.0)
Hemoglobin: 13.7 g/dL (ref 12.0–15.0)
Immature Granulocytes: 0 %
Lymphocytes Relative: 48 %
Lymphs Abs: 3.3 10*3/uL (ref 0.7–4.0)
MCH: 28.7 pg (ref 26.0–34.0)
MCHC: 33.3 g/dL (ref 30.0–36.0)
MCV: 86.4 fL (ref 80.0–100.0)
Monocytes Absolute: 0.5 10*3/uL (ref 0.1–1.0)
Monocytes Relative: 8 %
Neutro Abs: 2.8 10*3/uL (ref 1.7–7.7)
Neutrophils Relative %: 40 %
Platelets: 214 10*3/uL (ref 150–400)
RBC: 4.77 MIL/uL (ref 3.87–5.11)
RDW: 13.1 % (ref 11.5–15.5)
WBC: 6.9 10*3/uL (ref 4.0–10.5)
nRBC: 0 % (ref 0.0–0.2)

## 2022-04-13 LAB — BASIC METABOLIC PANEL
Anion gap: 10 (ref 5–15)
BUN: 13 mg/dL (ref 8–23)
CO2: 26 mmol/L (ref 22–32)
Calcium: 9.5 mg/dL (ref 8.9–10.3)
Chloride: 103 mmol/L (ref 98–111)
Creatinine, Ser: 1.05 mg/dL — ABNORMAL HIGH (ref 0.44–1.00)
GFR, Estimated: 59 mL/min — ABNORMAL LOW (ref >60)
Glucose, Bld: 106 mg/dL — ABNORMAL HIGH (ref 70–99)
Potassium: 4.1 mmol/L (ref 3.5–5.1)
Sodium: 139 mmol/L (ref 135–145)

## 2022-04-13 LAB — TROPONIN I (HIGH SENSITIVITY): Troponin I (High Sensitivity): 6 ng/L (ref <18)

## 2022-04-13 NOTE — ED Triage Notes (Signed)
Patient here with complaint of left arm weakness and numbness that was present on waking this morning, LKW midnight. Patient is alert, oriented, and in no apparent distress at this time.

## 2022-04-13 NOTE — ED Triage Notes (Signed)
Pt states she woke up at 0400 with left shoulder blade pain that is now going down all arm with numbness to left arm and hand.

## 2022-04-13 NOTE — ED Notes (Signed)
Patient ans spouse instructed by Bess Harvest PA to go straight to [facility] ED. Pt declined ambulance.

## 2022-04-13 NOTE — Discharge Instructions (Signed)
You have signs of a stroke or an acute encephalopathy. Both you and your husband have refused transport to the hospital by EMS. This condition is a medical emergency and can have irreversible consequences. Please report straight to the [facility] right now. Do not go anywhere else.

## 2022-04-13 NOTE — ED Provider Triage Note (Signed)
Emergency Medicine Provider Triage Evaluation Note  Brandy Love , a 66 y.o. female  was evaluated in triage.  Pt complains of numbness. LKN yesterday. Awoke today with pain to left scapula and mid back. Went into left arm, Felt tingling without weakness. No HA, facial droop. No recent falls or injuries. Sx improved. Seen at [facility] and sent here  Review of Systems  Positive: Back pain, numbness Negative:   Physical Exam  There were no vitals taken for this visit. Gen:   Awake, no distress   Resp:  Normal effort, 2 + radial pulses MSK:   Moves extremities without difficulty, no obvious VTE on exam Other:    Medical Decision Making  Medically screening exam initiated at 5:16 PM.  Appropriate orders placed.  Brandy Love was informed that the remainder of the evaluation will be completed by another provider, this initial triage assessment does not replace that evaluation, and the importance of remaining in the ED until their evaluation is complete.  Back pain, numbness.  Not a code stroke, Neg LVO criteria   Brandy Love A, PA-C 04/13/22 2130

## 2022-04-13 NOTE — Procedures (Signed)
   Patient Name: Brandy Love, Brandy Love Study Date:06/27/2017 04/10/2022 Gender: Female D.O.B: March 30, 1956 Age (years): 33 Referring Provider: Malka So PA Height (inches): 68 Interpreting Physician: Fransico Him MD, ABSM Weight (lbs): 230 RPSGT: Earney Hamburg BMI: 35 MRN: [id number] Neck Size: 14.50  CLINICAL INFORMATION Sleep Study Type: NPSG  Indication for sleep study: N/A  Epworth Sleepiness Score: 19  SLEEP STUDY TECHNIQUE As per the AASM Manual for the Scoring of Sleep and Associated Events v2.3 (April 2016) with a hypopnea requiring 4% desaturations.  The channels recorded and monitored were frontal, central and occipital EEG, electrooculogram (EOG), submentalis EMG (chin), nasal and oral airflow, thoracic and abdominal wall motion, anterior tibialis EMG, snore microphone, electrocardiogram, and pulse oximetry.  MEDICATIONS Medications self-administered by patient taken the night of the study : OTC tylenol  SLEEP ARCHITECTURE The study was initiated at 11:32:46 PM and ended at 5:33:59 AM.  Sleep onset time was 7.4 minutes and the sleep efficiency was 84.7%. The total sleep time was 305.8 minutes.  Stage REM latency was 47.0 minutes.  The patient spent 0.5% of the night in stage N1 sleep, 65.5% in stage N2 sleep, 3.1% in stage N3 and 30.9% in REM.  Alpha intrusion was absent.  Supine sleep was 52.97%.  RESPIRATORY PARAMETERS The overall apnea/hypopnea index (AHI) was 4.3 per hour. There were 0 total apneas, including 0 obstructive, 0 central and 0 mixed apneas. There were 22 hypopneas and 10 RERAs.  The AHI during Stage REM sleep was 12.7 per hour.  AHI while supine was 7.8 per hour.  The mean oxygen saturation was 93.6%. The minimum SpO2 during sleep was 88.0%.  moderate snoring was noted during this study.  CARDIAC DATA The 2 lead EKG demonstrated sinus rhythm. The mean heart rate was 68.4 beats per minute. Other EKG findings include: Possible paroxysmal  atrial fibrillation.  LEG MOVEMENT DATA The total PLMS were 0 with a resulting PLMS index of 0.0. Associated arousal with leg movement index was 1.6 .  IMPRESSIONS - No significant obstructive sleep apnea occurred during this study (AHI = 4.3/h). - The patient had minimal or no oxygen desaturation during the study (Min O2 = 88.0%) - The patient snored with moderate snoring volume. - Possible paroxysmal atrial fibrillation was noted during this study. - Clinically significant periodic limb movements did not occur during sleep. No significant associated arousals.  DIAGNOSIS - Normal Sleep Study for sleep disordered breathing. - Possible Paroxysmal atrial fibrillation  RECOMMENDATIONS - Avoid alcohol, sedatives and other CNS depressants that may worsen sleep apnea and disrupt normal sleep architecture. - Sleep hygiene should be reviewed to assess factors that may improve sleep quality. - Weight management and regular exercise should be initiated or continued if appropriate.  [Electronically signed] 04/13/2022 02:25 PM  Fransico Him MD, ABSM Diplomate, American Board of Sleep Medicine

## 2022-04-13 NOTE — ED Notes (Signed)
Patient is being discharged from the Urgent Care and sent to the Emergency Department via private vehicle . Per [place] PA, patient is in need of higher level of care due to Left arm pain. Patient is aware and verbalizes understanding of plan of care.  Vitals:   04/13/22 1608  BP: 124/60  Pulse: (!) 59  Resp: 16  Temp: 98.1 F (36.7 C)  SpO2: 96%

## 2022-04-13 NOTE — ED Provider Notes (Signed)
[facility]  Note:  This document was prepared using Systems analyst and may include unintentional dictation errors.  MRN: [id number] DOB: 06/01/1956  Subjective:   Brandy Love is a 66 y.o. female presenting for acute onset at 4am this morning left-sided trapezius pain, shoulder blade pain, left shoulder.  The symptoms have progressed to left arm weakness, tingling and is now having left-sided facial pricking sensations, tingling.  Has a history of atrial fibrillation.  No history of stroke.  Patient is on Xarelto.  No current facility-administered medications for this encounter.  Current Outpatient Medications:    albuterol (PROVENTIL HFA;VENTOLIN HFA) 108 (90 Base) MCG/ACT inhaler, Inhale into the lungs every 6 (six) hours as needed for wheezing or shortness of breath., Disp: , Rfl:    chlorthalidone (HYGROTON) 25 MG tablet, Take 1 tablet (25 mg total) by mouth as needed., Disp: , Rfl:    clotrimazole-betamethasone (LOTRISONE) cream, Apply topically 2 (two) times daily as needed., Disp: , Rfl:    dronedarone (MULTAQ) 400 MG tablet, Take 1 tablet (400 mg total) by mouth 2 (two) times daily with a meal., Disp: 180 tablet, Rfl: 1   fluticasone (FLOVENT HFA) 110 MCG/ACT inhaler, Inhale 2 puffs into the lungs 2 (two) times daily., Disp: , Rfl:    hyoscyamine (LEVSIN SL) 0.125 MG SL tablet, Place under the tongue 2 (two) times daily as needed., Disp: , Rfl:    losartan (COZAAR) 100 MG tablet, Take 100 mg by mouth daily., Disp: , Rfl:    metoprolol tartrate (LOPRESSOR) 25 MG tablet, Take 0.5 tablets (12.5 mg total) by mouth 2 (two) times daily., Disp: 90 tablet, Rfl: 3   omeprazole (PRILOSEC) 40 MG capsule, Take 40 mg by mouth daily., Disp: , Rfl:    rivaroxaban (XARELTO) 20 MG TABS tablet, Take 1 tablet (20 mg total) by mouth daily with supper., Disp: 90 tablet, Rfl: 1   sucralfate (CARAFATE) 1 g tablet, Take 1 g by mouth as needed., Disp: , Rfl:     triamcinolone (KENALOG) 0.1 % paste, 2 (two) times daily as needed., Disp: , Rfl:    Allergies  Allergen Reactions   Adhesive [Tape] Other (See Comments)    Rash with transderm   Erythromycin Hives   Ibuprofen     REACTION: vertigo   Shellfish Allergy Nausea Only and Swelling    Swelling of throat   Silicone Other (See Comments)    Rash with transderm   Amlodipine Hives, Swelling and Rash   Betadine [Povidone Iodine] Rash     ALL TOPICAL IODINES   Povidone-Iodine Rash    ALL TOPICAL IODINES    Past Medical History:  Diagnosis Date   A-fib ([place])    Asthma    controlled well   Bladder prolapse, female, acquired    Edema extremities    lower legs at times   Gastric ulcer    GERD (gastroesophageal reflux disease)    Glaucoma    High blood pressure    OAB (overactive bladder)    Pre-diabetes    Prediabetes      Past Surgical History:  Procedure Laterality Date   ABDOMINAL HYSTERECTOMY  1993   ANTERIOR AND POSTERIOR REPAIR N/A 06/04/2019   Procedure: CYSTOSCOPY ANTERIOR (CYSTOCELE);  Surgeon: Bjorn Loser, MD;  Location: [facility];  Service: Urology;  Laterality: N/A;   APPENDECTOMY     CATARACT EXTRACTION  2016   CYSTECTOMY     1994 and 1982   INTERSTIM  IMPLANT PLACEMENT  04/10/2012   Procedure: Barrie Lyme IMPLANT FIRST STAGE;  Surgeon: [name], MD;  Location: [facility];  Service: Urology;  Laterality: N/A;  rad tech ok per vicki at main    INTERSTIM IMPLANT PLACEMENT  04/10/2012   Procedure: Barrie Lyme IMPLANT SECOND STAGE;  Surgeon: [name], MD;  Location: [facility];  Service: Urology;  Laterality: N/A;   LOWER LEG SOFT TISSUE TUMOR EXCISION  1994   cyst left ankle -involved muscle removed-limited mobility now   NASAL SEPTUM SURGERY     PUBOVAGINAL SLING N/A 06/04/2019   Procedure: Gaynelle Arabian;  Surgeon: Bjorn Loser, MD;  Location: [facility];  Service: Urology;  Laterality: N/A;    Family History   Problem Relation Age of Onset   Asthma Mother    Diabetes Mother    Hernia Mother    Obesity Mother    Heart disease Father    Prostate cancer Father     Social History   Tobacco Use   Smoking status: Never   Smokeless tobacco: Never  Vaping Use   Vaping Use: Never used  Substance Use Topics   Alcohol use: No   Drug use: No    ROS   Objective:   Vitals: BP 124/60 (BP Location: Right Arm)   Pulse (!) 59   Temp 98.1 F (36.7 C) (Oral)   Resp 16   SpO2 96%   Physical Exam Constitutional:      General: She is not in acute distress.    Appearance: Normal appearance. She is well-developed. She is not ill-appearing, toxic-appearing or diaphoretic.  HENT:     Head: Normocephalic and atraumatic.     Nose: Nose normal.     Mouth/Throat:     Mouth: Mucous membranes are moist.  Eyes:     General: No scleral icterus.       Right eye: No discharge.        Left eye: No discharge.     Extraocular Movements: Extraocular movements intact.  Cardiovascular:     Rate and Rhythm: Normal rate and regular rhythm.     Heart sounds: Normal heart sounds. No murmur heard.    No friction rub. No gallop.  Pulmonary:     Effort: Pulmonary effort is normal. No respiratory distress.     Breath sounds: No stridor. No wheezing, rhonchi or rales.  Chest:     Chest wall: No tenderness.  Skin:    General: Skin is warm and dry.  Neurological:     General: No focal deficit present.     Mental Status: She is alert and oriented to person, place, and time.     Motor: Weakness (left extremity) and pronator drift (to the left) present.  Psychiatric:        Mood and Affect: Mood normal.        Behavior: Behavior normal.    ED ECG REPORT   Date: 04/13/2022  EKG Time: 4:19 PM  Rate: 58bpm  Rhythm: sinus bradycardia,  unchanged from previous tracings  Axis: normal  Intervals:first-degree A-V block   ST&T Change: none  Narrative Interpretation: Sinus bradycardia with 1st degree AV block,  unchanged from previous ecg.   Assessment and Plan :   PDMP not reviewed this encounter.  1. Acute encephalopathy   2. Left arm weakness   3. Left face and left arm tingling   4. Atrial fibrillation, unspecified type [facility])     Patient has left arm  weakness, left pronator drift concerning for an acute stroke, TIA, acute encephalopathy.  I discussed this with patient and her husband both of whom refuse transport to the hospital by EMS.  I advised of the critical nature of the differential.  They verbalized understanding.  They contract for safety and will present to the emergency room now at [place], [place] 04/13/22 1634

## 2022-04-14 ENCOUNTER — Telehealth: Payer: Self-pay | Admitting: *Deleted

## 2022-04-14 ENCOUNTER — Emergency Department (HOSPITAL_COMMUNITY): Payer: Medicare Other

## 2022-04-14 DIAGNOSIS — J45909 Unspecified asthma, uncomplicated: Secondary | ICD-10-CM

## 2022-04-14 DIAGNOSIS — I639 Cerebral infarction, unspecified: Secondary | ICD-10-CM | POA: Diagnosis present

## 2022-04-14 DIAGNOSIS — K219 Gastro-esophageal reflux disease without esophagitis: Secondary | ICD-10-CM

## 2022-04-14 DIAGNOSIS — I6381 Other cerebral infarction due to occlusion or stenosis of small artery: Secondary | ICD-10-CM

## 2022-04-14 DIAGNOSIS — H409 Unspecified glaucoma: Secondary | ICD-10-CM

## 2022-04-14 DIAGNOSIS — I480 Paroxysmal atrial fibrillation: Secondary | ICD-10-CM

## 2022-04-14 DIAGNOSIS — I10 Essential (primary) hypertension: Secondary | ICD-10-CM

## 2022-04-14 HISTORY — DX: Other cerebral infarction due to occlusion or stenosis of small artery: I63.81

## 2022-04-14 HISTORY — DX: Cerebral infarction, unspecified: I63.9

## 2022-04-14 LAB — HEMOGLOBIN A1C
Hgb A1c MFr Bld: 5.6 % (ref 4.8–5.6)
Mean Plasma Glucose: 114.02 mg/dL

## 2022-04-14 LAB — LIPID PANEL
Cholesterol: 181 mg/dL (ref 0–200)
HDL: 49 mg/dL (ref >40)
LDL Cholesterol: 119 mg/dL — ABNORMAL HIGH (ref 0–99)
Total CHOL/HDL Ratio: 3.7 RATIO
Triglycerides: 65 mg/dL (ref <150)
VLDL: 13 mg/dL (ref 0–40)

## 2022-04-14 LAB — TROPONIN I (HIGH SENSITIVITY): Troponin I (High Sensitivity): 6 ng/L (ref <18)

## 2022-04-14 MED ORDER — FLUTICASONE PROPIONATE HFA 110 MCG/ACT IN AERO: 2.0000 | INHALATION_SPRAY | Freq: Two times a day (BID) | RESPIRATORY_TRACT | Status: DC

## 2022-04-14 MED ORDER — METOPROLOL TARTRATE 25 MG PO TABS
12.5000 mg | ORAL_TABLET | Freq: Two times a day (BID) | ORAL | Status: DC
  Administered 2022-04-14 – 2022-04-15 (×2): 12.5 mg via ORAL
  Filled 2022-04-14 (×2): qty 1

## 2022-04-14 MED ORDER — ALBUTEROL SULFATE (2.5 MG/3ML) 0.083% IN NEBU: 3.0000 mL | INHALATION_SOLUTION | Freq: Four times a day (QID) | RESPIRATORY_TRACT | Status: DC | PRN

## 2022-04-14 MED ORDER — PANTOPRAZOLE SODIUM 40 MG PO TBEC
40.0000 mg | DELAYED_RELEASE_TABLET | Freq: Every day | ORAL | Status: DC
  Administered 2022-04-14 – 2022-04-15 (×2): 40 mg via ORAL
  Filled 2022-04-14 (×2): qty 1

## 2022-04-14 MED ORDER — STROKE: EARLY STAGES OF RECOVERY BOOK
Freq: Once | Status: AC
  Filled 2022-04-14: qty 1

## 2022-04-14 MED ORDER — DIAZEPAM 5 MG PO TABS
5.0000 mg | ORAL_TABLET | Freq: Once | ORAL | Status: AC | PRN
  Administered 2022-04-14: 5 mg via ORAL
  Filled 2022-04-14: qty 1

## 2022-04-14 MED ORDER — ACETAMINOPHEN 325 MG PO TABS: 650.0000 mg | ORAL_TABLET | Freq: Four times a day (QID) | ORAL | Status: DC | PRN

## 2022-04-14 MED ORDER — ASPIRIN 81 MG PO CHEW: 81.0000 mg | CHEWABLE_TABLET | Freq: Once | ORAL | Status: DC

## 2022-04-14 MED ORDER — ATORVASTATIN CALCIUM 80 MG PO TABS
80.0000 mg | ORAL_TABLET | Freq: Every day | ORAL | Status: DC
  Administered 2022-04-15: 80 mg via ORAL
  Filled 2022-04-14: qty 1

## 2022-04-14 MED ORDER — LOSARTAN POTASSIUM 50 MG PO TABS: 100.0000 mg | ORAL_TABLET | Freq: Every day | ORAL | Status: DC

## 2022-04-14 MED ORDER — DRONEDARONE HCL 400 MG PO TABS
400.0000 mg | ORAL_TABLET | Freq: Two times a day (BID) | ORAL | Status: DC
  Administered 2022-04-14: 400 mg via ORAL
  Filled 2022-04-14 (×3): qty 1

## 2022-04-14 MED ORDER — RIVAROXABAN 10 MG PO TABS
20.0000 mg | ORAL_TABLET | Freq: Every day | ORAL | Status: DC
  Administered 2022-04-14: 20 mg via ORAL
  Filled 2022-04-14: qty 1

## 2022-04-14 NOTE — H&P (Signed)
History and Physical   Brandy Love VUD:[id number] DOB: August 14, 1955 DOA: 04/13/2022  PCP: Shirline Frees, MD   Patient coming from: Home  Chief Complaint: Weakness and numbness  HPI: Brandy Love is a 66 y.o. female with medical history significant of glaucoma, asthma, GERD, hypertension, paroxysmal atrial fibrillation, bladder prolapse presenting with left sided numbness and weakness.  Patient reports sudden onset yesterday of sharp left-sided scapular pain followed by left arm numbness and weakness.  Also reporting some associated left facial weakness that occurred later in the day.    Patient presented to urgent care for initial evaluation however due to her weakness and numbness she was sent to the ED for further work-up for possible TIA versus stroke.  While at urgent care and again while in the ED she had the episode of her left facial numbness/weakness that was witnessed by providers.  States she did miss 2 doses of Xarelto on Saturday and 'Sunday due to switching pharmacies.  She denies fevers, chills, chest pain, shortness of breath, abdominal pain, constipation, diarrhea, nausea, vomiting.   ED Course: All signs in the ED significant for blood pressure in the 150s with systolic, heart rate initially in the 110s but improved to the 70s to 100s later in the ED.  Lab work-up included CMP with glucose 106.  CBC within normal limits.  Troponin negative x2.  Lipid panel and A1c pending.  Chest x-ray showed no acute abnormality.  CT head and CT C-spine showed no acute abnormality.  MR brain showed a 6 mm nonhemorrhagic acute/subacute infarct of the right thalamus.  Also noted were nonspecific scattered T2 hyperintensities.  Patient was resumed on home dronedarone, metoprolol in the ED.  Also losartan was resumed but per med rec may not of been given.  Patient also was resumed on home Valium, her PPI, and Xarelto.  Neurology was consulted and will see the patient.  As patient received  Xarelto no aspirin or Plavix at this time.  Review of Systems: As per HPI otherwise all other systems reviewed and are negative.  Past Medical History:  Diagnosis Date   A-fib ([facility])    Asthma    controlled well   Bladder prolapse, female, acquired    Edema extremities    lower legs at times   Gastric ulcer    GERD (gastroesophageal reflux disease)    Glaucoma    High blood pressure    OAB (overactive bladder)    Pre-diabetes    Prediabetes     Past Surgical History:  Procedure Laterality Date   ABDOMINAL HYSTERECTOMY  1993   ANTERIOR AND POSTERIOR REPAIR N/A 06/04/2019   Procedure: CYSTOSCOPY ANTERIOR (CYSTOCELE);  Surgeon: MacDiarmid, Scott, MD;  Location: [facility];  Service: Urology;  Laterality: N/A;   APPENDECTOMY     CATARACT EXTRACTION  2016   CYSTECTOMY     19'$ 94 and 1982   INTERSTIM IMPLANT PLACEMENT  04/10/2012   Procedure: Barrie Lyme IMPLANT FIRST STAGE;  Surgeon: [name], MD;  Location: [facility];  Service: Urology;  Laterality: N/A;  rad tech ok per vicki at [facility]    INTERSTIM IMPLANT PLACEMENT  04/10/2012   Procedure: Barrie Lyme IMPLANT SECOND STAGE;  Surgeon: [name], MD;  Location: [facility];  Service: Urology;  Laterality: N/A;   LOWER LEG SOFT TISSUE TUMOR EXCISION  1994   cyst left ankle -involved muscle removed-limited mobility now   NASAL SEPTUM SURGERY     PUBOVAGINAL SLING N/A 06/04/2019  Procedure: Gaynelle Arabian;  Surgeon: Bjorn Loser, MD;  Location: [facility];  Service: Urology;  Laterality: N/A;    Social History  reports that she has never smoked. She has never used smokeless tobacco. She reports that she does not drink alcohol and does not use drugs.  Allergies  Allergen Reactions   Adhesive [Tape] Other (See Comments)    Rash with transderm   Erythromycin Hives   Ibuprofen     REACTION: vertigo   Shellfish Allergy Nausea Only and Swelling    Swelling of throat   Silicone Other  (See Comments)    Rash with transderm   Amlodipine Hives, Swelling and Rash   Betadine [Povidone Iodine] Rash     ALL TOPICAL IODINES   Povidone-Iodine Rash    ALL TOPICAL IODINES    Family History  Problem Relation Age of Onset   Asthma Mother    Diabetes Mother    Hernia Mother    Obesity Mother    Heart disease Father    Prostate cancer Father   Reviewed on admission  Prior to Admission medications   Medication Sig Start Date End Date Taking? Authorizing Provider  albuterol (PROVENTIL HFA;VENTOLIN HFA) 108 (90 Base) MCG/ACT inhaler Inhale into the lungs every 6 (six) hours as needed for wheezing or shortness of breath.    [provider]  chlorthalidone (HYGROTON) 25 MG tablet Take 1 tablet (25 mg total) by mouth as needed. 02/15/22   Sherran Needs, NP  clotrimazole-betamethasone (LOTRISONE) cream Apply topically 2 (two) times daily as needed. 12/15/21   [provider]  dronedarone (MULTAQ) 400 MG tablet Take 1 tablet (400 mg total) by mouth 2 (two) times daily with a meal. 03/17/22   Sherran Needs, NP  fluticasone (FLOVENT HFA) 110 MCG/ACT inhaler Inhale 2 puffs into the lungs 2 (two) times daily.    [provider]  hyoscyamine (LEVSIN SL) 0.125 MG SL tablet Place under the tongue 2 (two) times daily as needed. 11/17/21   [provider]  losartan (COZAAR) 100 MG tablet Take 100 mg by mouth daily.    [provider]  metoprolol tartrate (LOPRESSOR) 25 MG tablet Take 0.5 tablets (12.5 mg total) by mouth 2 (two) times daily. 03/23/22 04/22/22  Sherran Needs, NP  omeprazole (PRILOSEC) 40 MG capsule Take 40 mg by mouth daily.    [provider]  rivaroxaban (XARELTO) 20 MG TABS tablet Take 1 tablet (20 mg total) by mouth daily with supper. 03/17/22   Sherran Needs, NP  sucralfate (CARAFATE) 1 g tablet Take 1 g by mouth as needed. 11/16/21   [provider]  triamcinolone (KENALOG) 0.1 % paste 2 (two) times daily as  needed. 12/09/21   [provider]    Physical Exam: Vitals:   04/14/22 0222 04/14/22 1023 04/14/22 1656 04/14/22 2118  BP:  (!) 167/145 (!) 163/90 (!) 155/98  Pulse:  (!) 114 78 (!) 103  Resp:  '19 16 18  '$ Temp: 98.5 F (36.9 C) 98.6 F (37 C) 98.5 F (36.9 C) 98.3 F (36.8 C)  TempSrc: Oral Oral Oral Oral  SpO2:  100% 99% 99%    Physical Exam Constitutional:      General: She is not in acute distress.    Appearance: Normal appearance.  HENT:     Head: Normocephalic and atraumatic.     Mouth/Throat:     Mouth: Mucous membranes are moist.     Pharynx: Oropharynx is clear.  Eyes:  Extraocular Movements: Extraocular movements intact.     Pupils: Pupils are equal, round, and reactive to light.  Cardiovascular:     Rate and Rhythm: Normal rate. Rhythm irregular.     Pulses: Normal pulses.     Heart sounds: Normal heart sounds.  Pulmonary:     Effort: Pulmonary effort is normal. No respiratory distress.     Breath sounds: Normal breath sounds.  Abdominal:     General: Bowel sounds are normal. There is no distension.     Palpations: Abdomen is soft.     Tenderness: There is no abdominal tenderness.  Musculoskeletal:        General: No swelling or deformity.  Skin:    General: Skin is warm and dry.  Neurological:     Comments: Mental Status: Patient is awake, alert, oriented x3 No signs of aphasia or neglect Cranial Nerves: II: Pupils equal, round, and reactive to light.   III,IV, VI: EOMI without ptosis or diploplia.  Mild nystagmus with leftward gaze. V: Mild decrease sensation at left V2. VII: Facial movement is symmetric.  VIII: hearing is intact to voice X: Uvula elevates symmetrically XI: Shoulder shrug is symmetric. XII: tongue is midline without atrophy or fasciculations.  Motor: Good effort thorughout, at Least 4-5/5 LUE, 5/5 RUE, 5/5 bilateral lower extremitiy  Sensory: Sensation is grossly intact bilateral UEs & LEs, "weird" sensation at left  upper extremity. Cerebellar: Finger-Nose intact bilalat    Labs on Admission: I have personally reviewed following labs and imaging studies  CBC: Recent Labs  Lab 04/13/22 1724  WBC 6.9  NEUTROABS 2.8  HGB 13.7  HCT 41.2  MCV 86.4  PLT 176    Basic Metabolic Panel: Recent Labs  Lab 04/13/22 1724  NA 139  K 4.1  CL 103  CO2 26  GLUCOSE 106*  BUN 13  CREATININE 1.05*  CALCIUM 9.5    GFR: Estimated Creatinine Clearance: 66.4 mL/min (A) (by C-G formula based on SCr of 1.05 mg/dL (H)).  Liver Function Tests: No results for input(s): "AST", "ALT", "ALKPHOS", "BILITOT", "PROT", "ALBUMIN" in the last 168 hours.  Urine analysis:    Component Value Date/Time   COLORURINE YELLOW 09/07/2010 0248   APPEARANCEUR CLOUDY (A) 09/07/2010 0248   LABSPEC 1.024 09/07/2010 0248   PHURINE 6.0 09/07/2010 0248   HGBUR NEGATIVE 09/07/2010 0248   BILIRUBINUR SMALL (A) 09/07/2010 0248   KETONESUR NEGATIVE 09/07/2010 0248   PROTEINUR 30 (A) 09/07/2010 0248   UROBILINOGEN 0.2 09/07/2010 0248   NITRITE NEGATIVE 09/07/2010 0248   LEUKOCYTESUR NEGATIVE 09/07/2010 0248    Radiological Exams on Admission: MR BRAIN WO CONTRAST  Result Date: 04/14/2022 CLINICAL DATA:  Neuro deficit, acute, stroke suspected. Numbness involving the left upper extremity left face. EXAM: MRI HEAD WITHOUT CONTRAST TECHNIQUE: Multiplanar, multiecho pulse sequences of the brain and surrounding structures were obtained without intravenous contrast. COMPARISON:  CT head 04/13/2022 FINDINGS: Brain: A 6 mm nonhemorrhagic acute/subacute infarct is present in the lateral right thalamus. No other acute infarct is present. Scattered subcortical T2 hyperintensities are mildly advanced for age. Mild generalized atrophy is present. Ventricles are of normal size. No significant extraaxial fluid collection is present. Dilated perivascular spaces are present the basal ganglia. Minimal white matter changes are present within the  brainstem. The cerebellum is within normal limits. Vascular: Flow is present in the major intracranial arteries. Skull and upper cervical spine: The craniocervical junction is normal. Upper cervical spine is within normal limits. Marrow signal is unremarkable. Sinuses/Orbits: The  paranasal sinuses and mastoid air cells are clear. Bilateral lens replacements are noted. Globes and orbits are otherwise unremarkable. IMPRESSION: 1. 6 mm nonhemorrhagic acute/subacute infarct of the lateral right thalamus. 2. Scattered subcortical T2 hyperintensities bilaterally are mildly advanced for age. The finding is nonspecific but can be seen in the setting of chronic microvascular ischemia, a demyelinating process such as multiple sclerosis, vasculitis, complicated migraine headaches, or as the sequelae of a prior infectious or inflammatory process. These results were called by telephone at the time of interpretation on 04/14/2022 at 9:03 pm to provider Dr. Nechama Guard, who verbally acknowledged these results. Electronically Signed   By: San Morelle M.D.   On: 04/14/2022 21:03   DG Chest 2 View  Result Date: 04/13/2022 CLINICAL DATA:  Left arm weakness and numbness EXAM: CHEST - 2 VIEW COMPARISON:  02/04/2022 FINDINGS: The heart size and mediastinal contours are within normal limits. Both lungs are clear. The visualized skeletal structures are unremarkable. IMPRESSION: No active cardiopulmonary disease. Electronically Signed   By: Elmer Picker M.D.   On: 04/13/2022 19:58   CT HEAD WO CONTRAST (5MM)  Result Date: 04/13/2022 CLINICAL DATA:  Left-sided weakness, initial encounter EXAM: CT HEAD WITHOUT CONTRAST CT CERVICAL SPINE WITHOUT CONTRAST TECHNIQUE: Multidetector CT imaging of the head and cervical spine was performed following the standard protocol without intravenous contrast. Multiplanar CT image reconstructions of the cervical spine were also generated. RADIATION DOSE REDUCTION: This exam was performed  according to the departmental dose-optimization program which includes automated exposure control, adjustment of the mA and/or kV according to patient size and/or use of iterative reconstruction technique. COMPARISON:  08/27/2021 FINDINGS: CT HEAD FINDINGS Brain: No evidence of acute infarction, hemorrhage, hydrocephalus, extra-axial collection or mass lesion/mass effect. Mild atrophic changes are noted. Vascular: No hyperdense vessel or unexpected calcification. Skull: Normal. Negative for fracture or focal lesion. Sinuses/Orbits: No acute finding. Other: None. CT CERVICAL SPINE FINDINGS Alignment: Within normal limits. Skull base and vertebrae: 7 cervical segments are well visualized. Mild osteophytic changes are seen. Multilevel facet hypertrophic changes are noted. No acute fracture or acute facet abnormality is noted. Soft tissues and spinal canal: Surrounding soft tissue structures are within normal limits. No adenopathy or hematoma is seen. Upper chest: Visualized lung apices are unremarkable. Other: None IMPRESSION: CT of the head: No acute intracranial abnormality noted. CT of the cervical spine: Multilevel degenerative change without acute abnormality. Electronically Signed   By: Inez Catalina M.D.   On: 04/13/2022 19:03   CT Cervical Spine Wo Contrast  Result Date: 04/13/2022 CLINICAL DATA:  Left-sided weakness, initial encounter EXAM: CT HEAD WITHOUT CONTRAST CT CERVICAL SPINE WITHOUT CONTRAST TECHNIQUE: Multidetector CT imaging of the head and cervical spine was performed following the standard protocol without intravenous contrast. Multiplanar CT image reconstructions of the cervical spine were also generated. RADIATION DOSE REDUCTION: This exam was performed according to the departmental dose-optimization program which includes automated exposure control, adjustment of the mA and/or kV according to patient size and/or use of iterative reconstruction technique. COMPARISON:  08/27/2021 FINDINGS: CT  HEAD FINDINGS Brain: No evidence of acute infarction, hemorrhage, hydrocephalus, extra-axial collection or mass lesion/mass effect. Mild atrophic changes are noted. Vascular: No hyperdense vessel or unexpected calcification. Skull: Normal. Negative for fracture or focal lesion. Sinuses/Orbits: No acute finding. Other: None. CT CERVICAL SPINE FINDINGS Alignment: Within normal limits. Skull base and vertebrae: 7 cervical segments are well visualized. Mild osteophytic changes are seen. Multilevel facet hypertrophic changes are noted. No acute fracture or acute facet  abnormality is noted. Soft tissues and spinal canal: Surrounding soft tissue structures are within normal limits. No adenopathy or hematoma is seen. Upper chest: Visualized lung apices are unremarkable. Other: None IMPRESSION: CT of the head: No acute intracranial abnormality noted. CT of the cervical spine: Multilevel degenerative change without acute abnormality. Electronically Signed   By: Inez Catalina M.D.   On: 04/13/2022 19:03    EKG: Not yet performed in the ED  Assessment/Plan Principal Problem:   Acute CVA (cerebrovascular accident) [facility]) Active Problems:   Unspecified glaucoma   Asthma   GERD   Essential hypertension   Paroxysmal atrial fibrillation ([facility])   Acute CVA > Patient presenting with left-sided weakness and numbness. > Initial CT study was negative.  MRI brain did show 6 mm nonhemorrhagic acute to subacute infarct of the right thalamus. > Neurology consulted and will see the patient.  As patient received Xarelto no aspirin or Plavix at this time. > Unclear etiology.  Did miss 2 doses of Xarelto when switching pharmacies on Saturday and Sunday in the setting of A-fib. - Monitor on telemetry - Appreciate neurology recommendations - Allow for permissive HTN, with slow normalization as initial event started around 36 hours ago.  - Start atorvastatin - Echocardiogram  - CTA head & neck - A1C  - Lipid panel  - Tele  monitoring  - SLP eval - PT/OT  Atrial fibrillation - Continue home Tradjenta round and metoprolol - Continue home Xarelto  Hypertension - Continue home metoprolol - Holding home losartan and chlorthalidone in the setting of slow normalization of blood pressure  Asthma - Continue home Flovent and as needed albuterol  GERD - Continue home PPI  DVT prophylaxis: Xarelto Code Status:   Full Family Communication:  Updated at bedside  Disposition Plan:   Patient is from:  Home  Anticipated DC to:  Home  Anticipated DC date:  1 to 2 days  Anticipated DC barriers: None  Consults called:  Neurology Admission status:  Observation, telemetry  Severity of Illness: The appropriate patient status for this patient is OBSERVATION. Observation status is judged to be reasonable and necessary in order to provide the required intensity of service to ensure the patient's safety. The patient's presenting symptoms, physical exam findings, and initial radiographic and laboratory data in the context of their medical condition is felt to place them at decreased risk for further clinical deterioration. Furthermore, it is anticipated that the patient will be medically stable for discharge from the hospital within 2 midnights of admission.    Marcelyn Bruins MD [facility]  How to contact the [facility] Attending or Consulting provider [place] or covering provider during after hours [name], for this patient?   Check the care team in [facility] and look for a) attending/consulting [facility] provider listed and b) the [facility] team listed Log into [email or web address] and use [place]'s universal password to access. If you do not have the password, please contact the hospital operator. Locate the [facility] provider you are looking for under [facility] and page to a number that you can be directly reached. If you still have difficulty reaching the provider, please page the [facility] (Director on Call) for the Hospitalists listed  on amion for assistance.  04/14/2022, 9:53 PM

## 2022-04-14 NOTE — ED Notes (Signed)
Patients spouse returned with controller. Currently charging at nurses station. Once fully charged MRI will be notified.

## 2022-04-14 NOTE — ED Provider Notes (Signed)
  Physical Exam  BP (!) 155/98   Pulse (!) 103   Temp 98.3 F (36.8 C) (Oral)   Resp 18   SpO2 99%   Physical Exam  Procedures  Procedures  ED Course / MDM   Clinical Course as of 04/14/22 2150  Thu Apr 14, 8233  [id number] 66 year old with A-fib on Xarelto who presented with left upper extremity weakness and numbness and tingling with scapular pain since yesterday.  She was sent here from urgent care for evaluation of stroke.  Labs generally unremarkable.  Creatinine slightly elevated 1.05.  CT head unremarkable chest x-ray unremarkable.  CT C-spine with degenerative change. 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  It is acute/subacute in nature.  Patient has received her Xarelto tonight.  Paged neurology. [VB]  2115 Dr. Leonel Ramsay will be down to evaluate the patient he request admission to hospitalist.  He will put in his note with recommendations. [VB]  2149 Discussed case with hospitalist will be down to evaluate patient and put in orders for admission. [VB]    Clinical Course User Index [VB] Elgie Congo, MD   Medical Decision Making Amount and/or Complexity of Data Reviewed Labs: ordered. Radiology: ordered.  Risk Prescription drug management. Decision regarding hospitalization.          Elgie Congo, MD 04/14/22 2150

## 2022-04-14 NOTE — ED Provider Notes (Signed)
[facility]PART[facility] Provider Note   CSN: [id number] Arrival date & time: 04/13/22  1652     History  Chief Complaint  Patient presents with   Numbness    Brandy Love is a 66 y.o. female.  66 yo F with a chief complaints of arm weakness.  The patient was seen earlier today at urgent care and was sent here for emergent evaluation for possible stroke.  There were no beds available and the patient was placed in the hallway and would prefer not to provide any history or let me examine her until she is moved into a room.  I was able to get the patient into a room to conduct my history.  She tells me that she yesterday had developed some sharp left-sided scapular pain and then noticed that her left arm was numb and weak.  She also feels like the left side of her cheek is a bit numb as well.  Describes it more as of a positive feeling like she can feel it numb.  She went to urgent care and she had some weakness appreciated and was sent here to be evaluated for stroke or TIA.  She has a history of A-fib and is on Xarelto.  Has missed her doses while she is waited in the waiting room.        Home Medications Prior to Admission medications   Medication Sig Start Date End Date Taking? Authorizing Provider  albuterol (PROVENTIL HFA;VENTOLIN HFA) 108 (90 Base) MCG/ACT inhaler Inhale into the lungs every 6 (six) hours as needed for wheezing or shortness of breath.    [provider]  chlorthalidone (HYGROTON) 25 MG tablet Take 1 tablet (25 mg total) by mouth as needed. 02/15/22   Sherran Needs, NP  clotrimazole-betamethasone (LOTRISONE) cream Apply topically 2 (two) times daily as needed. 12/15/21   [provider]  dronedarone (MULTAQ) 400 MG tablet Take 1 tablet (400 mg total) by mouth 2 (two) times daily with a meal. 03/17/22   Sherran Needs, NP  fluticasone (FLOVENT HFA) 110 MCG/ACT inhaler Inhale 2 puffs into the lungs 2 (two) times daily.     [provider]  hyoscyamine (LEVSIN SL) 0.125 MG SL tablet Place under the tongue 2 (two) times daily as needed. 11/17/21   [provider]  losartan (COZAAR) 100 MG tablet Take 100 mg by mouth daily.    [provider]  metoprolol tartrate (LOPRESSOR) 25 MG tablet Take 0.5 tablets (12.5 mg total) by mouth 2 (two) times daily. 03/23/22 04/22/22  Sherran Needs, NP  omeprazole (PRILOSEC) 40 MG capsule Take 40 mg by mouth daily.    [provider]  rivaroxaban (XARELTO) 20 MG TABS tablet Take 1 tablet (20 mg total) by mouth daily with supper. 03/17/22   Sherran Needs, NP  sucralfate (CARAFATE) 1 g tablet Take 1 g by mouth as needed. 11/16/21   [provider]  triamcinolone (KENALOG) 0.1 % paste 2 (two) times daily as needed. 12/09/21   [provider]      Allergies    Adhesive [tape], Erythromycin, Ibuprofen, Shellfish allergy, Silicone, Amlodipine, Betadine [povidone iodine], and Povidone-iodine    Review of Systems   Review of Systems  Physical Exam Updated Vital Signs BP (!) 167/145 (BP Location: Left Arm)   Pulse (!) 114   Temp 98.6 F (37 C) (Oral)   Resp 19   SpO2 100%  Physical Exam Vitals and nursing note  reviewed.  Constitutional:      General: She is not in acute distress.    Appearance: She is well-developed. She is not diaphoretic.  HENT:     Head: Normocephalic and atraumatic.  Eyes:     Pupils: Pupils are equal, round, and reactive to light.  Cardiovascular:     Rate and Rhythm: Normal rate and regular rhythm.     Heart sounds: No murmur heard.    No friction rub. No gallop.  Pulmonary:     Effort: Pulmonary effort is normal.     Breath sounds: No wheezing or rales.  Abdominal:     General: There is no distension.     Palpations: Abdomen is soft.     Tenderness: There is no abdominal tenderness.  Musculoskeletal:        General: No tenderness.     Cervical back: Normal range of motion and neck supple.      Comments: She has some subjective decrease sensation to the left side of her face as well as to the left fourth and fifth digit of the left hand.  No midline C-spine tenderness step-offs or deformities.  She is able to rotate her head 45 degrees in either direction without pain.  She has some focal tenderness between the scapula and the thoracic spine about T7.  Intact pulse motor to the left upper extremity.  No appreciable weakness.  Skin:    General: Skin is warm and dry.  Neurological:     Mental Status: She is alert and oriented to person, place, and time.     Comments: Subjective decrease sensation to the left zygomatic arch.  No appreciable weakness to the left upper extremity.  Otherwise benign neurologic exam.  Psychiatric:        Behavior: Behavior normal.     ED Results / Procedures / Treatments   Labs (all labs ordered are listed, but only abnormal results are displayed) Labs Reviewed  BASIC METABOLIC PANEL - Abnormal; Notable for the following components:      Result Value   Glucose, Bld 106 (*)    Creatinine, Ser 1.05 (*)    GFR, Estimated 59 (*)    All other components within normal limits  CBC WITH DIFFERENTIAL/PLATELET  TROPONIN I (HIGH SENSITIVITY)  TROPONIN I (HIGH SENSITIVITY)    EKG None  Radiology DG Chest 2 View  Result Date: 04/13/2022 CLINICAL DATA:  Left arm weakness and numbness EXAM: CHEST - 2 VIEW COMPARISON:  02/04/2022 FINDINGS: The heart size and mediastinal contours are within normal limits. Both lungs are clear. The visualized skeletal structures are unremarkable. IMPRESSION: No active cardiopulmonary disease. Electronically Signed   By: Elmer Picker M.D.   On: 04/13/2022 19:58   CT HEAD WO CONTRAST (5MM)  Result Date: 04/13/2022 CLINICAL DATA:  Left-sided weakness, initial encounter EXAM: CT HEAD WITHOUT CONTRAST CT CERVICAL SPINE WITHOUT CONTRAST TECHNIQUE: Multidetector CT imaging of the head and cervical spine was performed following  the standard protocol without intravenous contrast. Multiplanar CT image reconstructions of the cervical spine were also generated. RADIATION DOSE REDUCTION: This exam was performed according to the departmental dose-optimization program which includes automated exposure control, adjustment of the mA and/or kV according to patient size and/or use of iterative reconstruction technique. COMPARISON:  08/27/2021 FINDINGS: CT HEAD FINDINGS Brain: No evidence of acute infarction, hemorrhage, hydrocephalus, extra-axial collection or mass lesion/mass effect. Mild atrophic changes are noted. Vascular: No hyperdense vessel or unexpected calcification. Skull: Normal. Negative for fracture or focal  lesion. Sinuses/Orbits: No acute finding. Other: None. CT CERVICAL SPINE FINDINGS Alignment: Within normal limits. Skull base and vertebrae: 7 cervical segments are well visualized. Mild osteophytic changes are seen. Multilevel facet hypertrophic changes are noted. No acute fracture or acute facet abnormality is noted. Soft tissues and spinal canal: Surrounding soft tissue structures are within normal limits. No adenopathy or hematoma is seen. Upper chest: Visualized lung apices are unremarkable. Other: None IMPRESSION: CT of the head: No acute intracranial abnormality noted. CT of the cervical spine: Multilevel degenerative change without acute abnormality. Electronically Signed   By: Inez Catalina M.D.   On: 04/13/2022 19:03   CT Cervical Spine Wo Contrast  Result Date: 04/13/2022 CLINICAL DATA:  Left-sided weakness, initial encounter EXAM: CT HEAD WITHOUT CONTRAST CT CERVICAL SPINE WITHOUT CONTRAST TECHNIQUE: Multidetector CT imaging of the head and cervical spine was performed following the standard protocol without intravenous contrast. Multiplanar CT image reconstructions of the cervical spine were also generated. RADIATION DOSE REDUCTION: This exam was performed according to the departmental dose-optimization program which  includes automated exposure control, adjustment of the mA and/or kV according to patient size and/or use of iterative reconstruction technique. COMPARISON:  08/27/2021 FINDINGS: CT HEAD FINDINGS Brain: No evidence of acute infarction, hemorrhage, hydrocephalus, extra-axial collection or mass lesion/mass effect. Mild atrophic changes are noted. Vascular: No hyperdense vessel or unexpected calcification. Skull: Normal. Negative for fracture or focal lesion. Sinuses/Orbits: No acute finding. Other: None. CT CERVICAL SPINE FINDINGS Alignment: Within normal limits. Skull base and vertebrae: 7 cervical segments are well visualized. Mild osteophytic changes are seen. Multilevel facet hypertrophic changes are noted. No acute fracture or acute facet abnormality is noted. Soft tissues and spinal canal: Surrounding soft tissue structures are within normal limits. No adenopathy or hematoma is seen. Upper chest: Visualized lung apices are unremarkable. Other: None IMPRESSION: CT of the head: No acute intracranial abnormality noted. CT of the cervical spine: Multilevel degenerative change without acute abnormality. Electronically Signed   By: Inez Catalina M.D.   On: 04/13/2022 19:03    Procedures Procedures    Medications Ordered in ED Medications  rivaroxaban (XARELTO) tablet 20 mg (has no administration in time range)  dronedarone (MULTAQ) tablet 400 mg (has no administration in time range)  losartan (COZAAR) tablet 100 mg (has no administration in time range)  metoprolol tartrate (LOPRESSOR) tablet 12.5 mg (12.5 mg Oral Given 04/14/22 1130)  pantoprazole (PROTONIX) EC tablet 40 mg (40 mg Oral Given 04/14/22 1130)  diazepam (VALIUM) tablet 5 mg (has no administration in time range)    ED Course/ Medical Decision Making/ A&P                           Medical Decision Making Amount and/or Complexity of Data Reviewed Radiology: ordered.  Risk Prescription drug management.   66 yo F with a chief complaints  of left arm weakness.  All of the history was obtained through record review, patient would prefer not to discuss her case over to let me perform an exam while she is in the hallway which unfortunately was the only bed available currently.  Will await for room to become available.  I reviewed the patient's chest x-ray and independently interpreted it without focal obstructive pneumothorax.  CT scan of the head without intracranial pathology CT of the C-spine without obvious C-spine fracture as independently interpreted by me.  She has 2 troponins that are negative.  No anemia no leukocytosis no concerning  electrolyte abnormality.  I obtained further history from her once I get her back into her room.  Left-sided numbness and weakness.  Sounds more like positive symptoms and more like a compressive neuropathy, she did have some reported weakness at urgent care and so could be an atypical TIA.  We will order an MRI of the brain.  Patient care signed out to Dr. Nechama Guard, please see their note for further details of care in the ED.  The patients results and plan were reviewed and discussed.   Any x-rays performed were independently reviewed by myself.   Differential diagnosis were considered with the presenting HPI.  Medications  rivaroxaban (XARELTO) tablet 20 mg (has no administration in time range)  dronedarone (MULTAQ) tablet 400 mg (has no administration in time range)  losartan (COZAAR) tablet 100 mg (has no administration in time range)  metoprolol tartrate (LOPRESSOR) tablet 12.5 mg (12.5 mg Oral Given 04/14/22 1130)  pantoprazole (PROTONIX) EC tablet 40 mg (40 mg Oral Given 04/14/22 1130)  diazepam (VALIUM) tablet 5 mg (has no administration in time range)    Vitals:   04/13/22 2226 04/14/22 0146 04/14/22 0222 04/14/22 1023  BP: 138/65 (!) 154/88  (!) 167/145  Pulse: (!) 56 79  (!) 114  Resp: '16 16  19  '$ Temp: 98 F (36.7 C)  98.5 F (36.9 C) 98.6 F (37 C)  TempSrc: Oral  Oral Oral   SpO2: 100% 99%  100%    Final diagnoses:  Left arm weakness    Admission/ observation were discussed with the admitting physician, patient and/or family and they are comfortable with the plan.          Final Clinical Impression(s) / ED Diagnoses Final diagnoses:  Left arm weakness    Rx / DC Orders ED Discharge Orders     None         Deno Etienne, DO 04/14/22 1603

## 2022-04-14 NOTE — ED Notes (Signed)
PT refused to have her Vitals. PT stated " I'm not taking any more vitals out here not until I go back to see doctor."

## 2022-04-14 NOTE — ED Notes (Signed)
MRI notified that device is fully charged. MRI advised due to one scanner being down, there was still quite a wait.

## 2022-04-14 NOTE — ED Notes (Signed)
At  9:38 This NT called this PT to go back to Hallway 1, At the time this NT went to  her husband who was sitting in the hospital wheel chair stated that he would see the PT when she come back. As this NT walked through the doors , The PT stated I don't know what kind Of nursing service this is they won't even push you back this NT thought the visitor decided to remain back. This NT stated Im sorry would you like me to push him , she stated no it's to late now Once we got back to Hallway 1. The PT stated Im not going in no hallway, I explained that this was the area that is open for you , she stated she was going to call CEO, this NT left and report to the RN .  At 10:00am This NT was walking down the hall to take another PT in the process The PT begin yelling [place] , This NT continued to walk with the PT  to avoid any further distrubution .This NT spoke with Orinda Kenner about this situation.

## 2022-04-14 NOTE — ED Notes (Signed)
Patient refused vitals. Stated "I'm not doing that stuff out here with these people until I get back to a room"

## 2022-04-14 NOTE — Telephone Encounter (Signed)
CC'd Chart Routing History  Routing History  From: Sueanne Margarita, MD On: 04/13/2022 02:27 PM  To: Cv Div Sleep Studies (Pool)  Priority: Routine  Routing Comments:  Please let patient know that sleep study showed no significant sleep apnea.

## 2022-04-14 NOTE — ED Notes (Signed)
MRI called and stated that since the patient has a bladder stimulator the patient has to have their controller. Patient stated she did not have her controller with her. Spouse of patient went home to retrieve the controller. Charge and EDP notified of delay. Patient appears to be understanding of the situation. MRI to be contacted once she has the controller.

## 2022-04-14 NOTE — Telephone Encounter (Signed)
The patient has been notified of the result and verbalized understanding.  All questions (if any) were answered. Marolyn Hammock, [place] 04/14/2022 2:52 PM    Pt is aware and agreeable to normal results.

## 2022-04-14 NOTE — ED Notes (Addendum)
Patient refused vital check

## 2022-04-14 NOTE — ED Notes (Signed)
Patient transported to MRI 

## 2022-04-14 NOTE — ED Notes (Addendum)
Charge RN over to speak with patient. Pt is "obtaining an attorney and contacting the state" at time this charge RN approached her. Charge RN heard patient and husbands's concerns about being sent over here "emergency from the urgent care for treatment for a stroke right away". Upon review of the notes from triage PA, it appears LKN was reported as "yesterday (10/3)", on review of notes from UC, pt reports wake up symptoms around 0400 on 10/4. On arrival to ED yesterday, PA in triage determined pt to be LVO negative and thus NOT activated as code stroke. Charge RN attempted to provide education as to both stroke and triage process to explain the wait time, however pt and husband continued to interrupt this RN, making it challenging to get full sentence or explanation. Security was at bedside during this conversation as they were contacted by the [facility] for repeatedly calling the operator to complain directly to the [facility]. After about 10 minutes at the bedside discussing pt concerns, this RN was unable to fully get explanation or complete sentences out without being yelled over or cut off. This RN asked if I could finish explaining time and pt state "go ahead and get your speech out so I can throw trash on it". Explained that things were not being explained to have trash thrown on them. Pt then stated "Say whatever you want to say I don't care". Asked pt if she would be willing to continue the conversation to allow for better understanding of wait time and pt stated "I don't know, are you going to bring an adult over here to have this conversation?". At that time, this RN left bedside due to ongoing hostility from pt and inability to complete conversation.

## 2022-04-15 ENCOUNTER — Observation Stay (HOSPITAL_COMMUNITY): Payer: Medicare Other

## 2022-04-15 ENCOUNTER — Other Ambulatory Visit: Payer: Self-pay | Admitting: Neurology

## 2022-04-15 ENCOUNTER — Encounter (HOSPITAL_COMMUNITY): Payer: Self-pay | Admitting: Internal Medicine

## 2022-04-15 ENCOUNTER — Other Ambulatory Visit: Payer: Self-pay

## 2022-04-15 DIAGNOSIS — J45909 Unspecified asthma, uncomplicated: Secondary | ICD-10-CM | POA: Diagnosis present

## 2022-04-15 DIAGNOSIS — E669 Obesity, unspecified: Secondary | ICD-10-CM | POA: Diagnosis present

## 2022-04-15 DIAGNOSIS — H409 Unspecified glaucoma: Secondary | ICD-10-CM | POA: Diagnosis present

## 2022-04-15 DIAGNOSIS — I69354 Hemiplegia and hemiparesis following cerebral infarction affecting left non-dominant side: Secondary | ICD-10-CM | POA: Diagnosis not present

## 2022-04-15 DIAGNOSIS — Z825 Family history of asthma and other chronic lower respiratory diseases: Secondary | ICD-10-CM | POA: Diagnosis not present

## 2022-04-15 DIAGNOSIS — I639 Cerebral infarction, unspecified: Secondary | ICD-10-CM | POA: Diagnosis present

## 2022-04-15 DIAGNOSIS — Z91048 Other nonmedicinal substance allergy status: Secondary | ICD-10-CM | POA: Diagnosis not present

## 2022-04-15 DIAGNOSIS — I6381 Other cerebral infarction due to occlusion or stenosis of small artery: Secondary | ICD-10-CM | POA: Diagnosis present

## 2022-04-15 DIAGNOSIS — I480 Paroxysmal atrial fibrillation: Secondary | ICD-10-CM | POA: Diagnosis present

## 2022-04-15 DIAGNOSIS — Z882 Allergy status to sulfonamides status: Secondary | ICD-10-CM | POA: Diagnosis not present

## 2022-04-15 DIAGNOSIS — Z833 Family history of diabetes mellitus: Secondary | ICD-10-CM | POA: Diagnosis not present

## 2022-04-15 DIAGNOSIS — Z888 Allergy status to other drugs, medicaments and biological substances status: Secondary | ICD-10-CM | POA: Diagnosis not present

## 2022-04-15 DIAGNOSIS — Z886 Allergy status to analgesic agent status: Secondary | ICD-10-CM | POA: Diagnosis not present

## 2022-04-15 DIAGNOSIS — N8110 Cystocele, unspecified: Secondary | ICD-10-CM | POA: Diagnosis present

## 2022-04-15 DIAGNOSIS — Z7902 Long term (current) use of antithrombotics/antiplatelets: Secondary | ICD-10-CM | POA: Diagnosis not present

## 2022-04-15 DIAGNOSIS — R29810 Facial weakness: Secondary | ICD-10-CM | POA: Diagnosis present

## 2022-04-15 DIAGNOSIS — I6389 Other cerebral infarction: Secondary | ICD-10-CM

## 2022-04-15 DIAGNOSIS — Z881 Allergy status to other antibiotic agents status: Secondary | ICD-10-CM | POA: Diagnosis not present

## 2022-04-15 DIAGNOSIS — K219 Gastro-esophageal reflux disease without esophagitis: Secondary | ICD-10-CM | POA: Diagnosis present

## 2022-04-15 DIAGNOSIS — I10 Essential (primary) hypertension: Secondary | ICD-10-CM | POA: Diagnosis present

## 2022-04-15 DIAGNOSIS — Z7982 Long term (current) use of aspirin: Secondary | ICD-10-CM | POA: Diagnosis not present

## 2022-04-15 DIAGNOSIS — T45516A Underdosing of anticoagulants, initial encounter: Secondary | ICD-10-CM | POA: Diagnosis present

## 2022-04-15 DIAGNOSIS — Z79899 Other long term (current) drug therapy: Secondary | ICD-10-CM | POA: Diagnosis not present

## 2022-04-15 DIAGNOSIS — Z90710 Acquired absence of both cervix and uterus: Secondary | ICD-10-CM | POA: Diagnosis not present

## 2022-04-15 DIAGNOSIS — Z91013 Allergy to seafood: Secondary | ICD-10-CM | POA: Diagnosis not present

## 2022-04-15 DIAGNOSIS — Z9849 Cataract extraction status, unspecified eye: Secondary | ICD-10-CM | POA: Diagnosis not present

## 2022-04-15 LAB — ECHOCARDIOGRAM COMPLETE
AR max vel: 2.26 cm2
AV Area VTI: 2.26 cm2
AV Area mean vel: 2.23 cm2
AV Mean grad: 7 mmHg
AV Peak grad: 12.1 mmHg
Ao pk vel: 1.74 m/s
Area-P 1/2: 3.48 cm2
S' Lateral: 3.1 cm

## 2022-04-15 LAB — HIV ANTIBODY (ROUTINE TESTING W REFLEX): HIV Screen 4th Generation wRfx: NONREACTIVE

## 2022-04-15 MED ORDER — LOSARTAN POTASSIUM 100 MG PO TABS: 100.0000 mg | ORAL_TABLET | Freq: Every day | ORAL | Status: DC

## 2022-04-15 MED ORDER — IOHEXOL 350 MG/ML SOLN
75.0000 mL | Freq: Once | INTRAVENOUS | Status: AC | PRN
  Administered 2022-04-15: 75 mL via INTRAVENOUS

## 2022-04-15 MED ORDER — ASPIRIN 81 MG PO TBEC
81.0000 mg | DELAYED_RELEASE_TABLET | Freq: Every day | ORAL | Status: DC
  Administered 2022-04-15: 81 mg via ORAL
  Filled 2022-04-15: qty 1

## 2022-04-15 MED ORDER — ATORVASTATIN CALCIUM 80 MG PO TABS: 80.0000 mg | ORAL_TABLET | Freq: Every day | ORAL | 0 refills | Status: DC

## 2022-04-15 MED ORDER — ASPIRIN 81 MG PO TBEC: 81.0000 mg | DELAYED_RELEASE_TABLET | Freq: Every day | ORAL | 0 refills | Status: DC

## 2022-04-15 MED ORDER — BUDESONIDE 0.25 MG/2ML IN SUSP
0.2500 mg | Freq: Two times a day (BID) | RESPIRATORY_TRACT | Status: DC
  Administered 2022-04-15 (×2): 0.25 mg via RESPIRATORY_TRACT
  Filled 2022-04-15 (×2): qty 2

## 2022-04-15 NOTE — Evaluation (Signed)
Physical Therapy Evaluation Patient Details Name: Brandy Love MRN: [id number] DOB: 1955-07-26 Today's Date: 04/15/2022  History of Present Illness  Pt is a 65 y/o female admitted from urgent care secondary to LUE numbness. MRI showed R lateral thalamus infarct. PMH includes glaucoma, HTN, and a fib.  Clinical Impression  Pt admitted secondary to problem above with deficits below. Pt requiring supervision for mobility tasks with occasional min guard for safety during higher level balance tasks. Pt reports some persistent numbness in L forearm and fingers and discussed precautions and exercises with pt. Discussed outpatient PT, but pt reports she does not feel she needs at this time. No further acute skilled PT needs. Will sign off. If needs change, please re-consult.        Recommendations for follow up therapy are one component of a multi-disciplinary discharge planning process, led by the attending physician.  Recommendations may be updated based on patient status, additional functional criteria and insurance authorization.  Follow Up Recommendations No PT follow up      Assistance Recommended at Discharge Intermittent Supervision/Assistance  Patient can return home with the following  Assistance with cooking/housework    Equipment Recommendations None recommended by PT  Recommendations for Other Services       Functional Status Assessment Patient has had a recent decline in their functional status and demonstrates the ability to make significant improvements in function in a reasonable and predictable amount of time.     Precautions / Restrictions Precautions Precautions: None Restrictions Weight Bearing Restrictions: No      Mobility  Bed Mobility Overal bed mobility: Modified Independent                  Transfers Overall transfer level: Modified independent                      Ambulation/Gait Ambulation/Gait assistance: Supervision, Min  guard Gait Distance (Feet): 150 Feet Assistive device: None Gait Pattern/deviations: Step-through pattern, Decreased stride length Gait velocity: Decreased     General Gait Details: Mild wooziness reported when weaving to the L and R, requiring min guard for safety. Mild instability when stepping over object, but pt reports difficulty with that normally. Otherwise requiring supervision for safety.  Stairs            Wheelchair Mobility    Modified Rankin (Stroke Patients Only)       Balance Overall balance assessment: Mild deficits observed, not formally tested                                           Pertinent Vitals/Pain Pain Assessment Pain Assessment: No/denies pain    Home Living Family/patient expects to be discharged to:: Private residence Living Arrangements: Spouse/significant other Available Help at Discharge: Family Type of Home: House Home Access: Stairs to enter Entrance Stairs-Rails: Psychiatric nurse of Steps: 5   Home Layout: One level Home Equipment: Grab bars - tub/shower;Grab bars - toilet;Rollator (4 wheels);Rolling Walker (2 wheels);BSC/3in1;Shower seat;Wheelchair - Press photographer      Prior Function Prior Level of Function : Independent/Modified Independent;Driving                     Hand Dominance   Dominant Hand: Right    Extremity/Trunk Assessment   Upper Extremity Assessment Upper Extremity Assessment: LUE deficits/detail LUE Deficits / Details: Reporting decreased  sensation from elbow down to fingers. Grip strength mildly less in LUE than the R.    Lower Extremity Assessment Lower Extremity Assessment: Generalized weakness    Cervical / Trunk Assessment Cervical / Trunk Assessment: Normal  Communication   Communication: No difficulties  Cognition Arousal/Alertness: Awake/alert Behavior During Therapy: WFL for tasks assessed/performed Overall Cognitive Status: Within  Functional Limits for tasks assessed                                          General Comments General comments (skin integrity, edema, etc.): Discussed outpatient PT, but pt reports she doesn't feel she needs at this time.    Exercises     Assessment/Plan    PT Assessment Patient does not need any further PT services  PT Problem List         PT Treatment Interventions      PT Goals (Current goals can be found in the Care Plan section)  Acute Rehab PT Goals Patient Stated Goal: to go home PT Goal Formulation: With patient Time For Goal Achievement: 04/29/22 Potential to Achieve Goals: Good    Frequency       Co-evaluation               AM-PAC PT "6 Clicks" Mobility  Outcome Measure Help needed turning from your back to your side while in a flat bed without using bedrails?: None Help needed moving from lying on your back to sitting on the side of a flat bed without using bedrails?: None Help needed moving to and from a bed to a chair (including a wheelchair)?: A Little Help needed standing up from a chair using your arms (e.g., wheelchair or bedside chair)?: A Little Help needed to walk in hospital room?: A Little Help needed climbing 3-5 steps with a railing? : A Little 6 Click Score: 20    End of Session   Activity Tolerance: Patient tolerated treatment well Patient left: in bed;with call bell/phone within reach;with family/visitor present (on stretcher in ED) Nurse Communication: Mobility status PT Visit Diagnosis: Other symptoms and signs involving the nervous system (V40.086)    Time: 7619-5093 PT Time Calculation (min) (ACUTE ONLY): 17 min   Charges:   PT Evaluation $PT Eval Low Complexity: 1 Low          Lou Miner, DPT  Acute Rehabilitation Services  Office: [phone]   Rudean Hitt 04/15/2022, 3:13 PM

## 2022-04-15 NOTE — Consult Note (Addendum)
Neurology Consultation Reason for Consult: Stroke Referring Physician: Nechama Guard, V  CC: Left-sided numbness  History is obtained from: Patient  HPI: Brandy Love is a 66 y.o. female with a history of atrial fibrillation on anticoagulation who presents with left-sided numbness and several episodes of weakness.  She states that she woke up at 4 AM yesterday and noticed that her left hand would not work, she said it would not open or close.  It also felt numb.  This improved, but she sought care in urgent care and while she was being examined the left side of her face became droopy.  Due to this, the physician referred her to the emergency department.  Her symptoms improved quickly.  The numbness in her arm, however, has never improved.  She states that she had a third episode of facial weakness while in the hallway, which also quickly improved.   LKW: 10/3 prior to bed tpa given?: no, outside of window NIHSS: 1  Past Medical History:  Diagnosis Date   A-fib ([place])    Asthma    controlled well   Bladder prolapse, female, acquired    Edema extremities    lower legs at times   Gastric ulcer    GERD (gastroesophageal reflux disease)    Glaucoma    High blood pressure    OAB (overactive bladder)    Pre-diabetes    Prediabetes      Family History  Problem Relation Age of Onset   Asthma Mother    Diabetes Mother    Hernia Mother    Obesity Mother    Heart disease Father    Prostate cancer Father      Social History:  reports that she has never smoked. She has never used smokeless tobacco. She reports that she does not drink alcohol and does not use drugs.   Exam: Current vital signs: BP 134/68   Pulse 70   Temp 98.3 F (36.8 C) (Oral)   Resp 17   SpO2 100%  Vital signs in last 24 hours: Temp:  [98.3 F (36.8 C)-98.6 F (37 C)] 98.3 F (36.8 C) (10/05 2118) Pulse Rate:  [70-114] 70 (10/06 0000) Resp:  [16-27] 17 (10/06 0000) BP: (125-167)/(62-145) 134/68 (10/06  0000) SpO2:  [94 %-100 %] 100 % (10/06 0000)   Physical Exam  Constitutional: Appears well-developed and well-nourished.  Psych: Affect appropriate to situation Eyes: No scleral injection HENT: No OP obstruction MSK: no joint deformities.  Cardiovascular: Normal rate and regular rhythm.  Respiratory: Effort normal, non-labored breathing GI: Soft.  No distension. There is no tenderness.  Skin: WDI  Neuro: Mental Status: Patient is awake, alert, oriented to person, place, month, year, and situation. Patient is able to give a clear and coherent history. No signs of aphasia or neglect Cranial Nerves: II: Visual Fields are full. Pupils are equal, round, and reactive to light.   III,IV, VI: EOMI without ptosis or diploplia.  V: Facial sensation is symmetric to temperature VII: Facial movement is symmetric.  VIII: hearing is intact to voice X: Uvula elevates symmetrically XI: Shoulder shrug is symmetric. XII: tongue is midline without atrophy or fasciculations.  Motor: Tone is normal. Bulk is normal. 5/5 strength was present in all four extremities.  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  This is more likely to be small vessel disease rather than breakthrough on her anticoagulation.  The stroke is small, and she has already received her anticoagulant tonight, but I would still favor adding antiplatelet therapy given that she has had three events of motor involvement.  She will need secondary risk factor modification.  Recommendations: - HgbA1c, fasting lipid panel - MRI of the brain without contrast - Frequent  neuro checks - Echocardiogram - CTA head and neck - Prophylactic therapy-aspirin 81 mg daily - Risk factor modification - Telemetry monitoring - PT consult, OT consult, Speech consult - Stroke team to follow    Roland Rack, MD [facility] [phone]  If 7pm- 7am, please page neurology on call as listed in [place].

## 2022-04-15 NOTE — Progress Notes (Signed)
Notified by radiology that patient has significant started about history of iodine allergy.  Appears that this is due to topical iodine's causing rash per chart.  She has had prior CTs with contrast in the past.  However radiology tech reports patient continued significant anxiety.  We will hold off on exam tonight and further clarify allergy status with plan for exam tomorrow once cleared/clarified.  Pearson Grippe

## 2022-04-15 NOTE — Evaluation (Signed)
Speech Language Pathology Evaluation Patient Details Name: Brandy Love MRN: [id number] DOB: 08/14/1955 Today's Date: 04/15/2022 Time: 8250-5397 SLP Time Calculation (min) (ACUTE ONLY): 13 min  Problem List:  Patient Active Problem List   Diagnosis Date Noted   Stroke ([place]) 04/15/2022   Acute CVA (cerebrovascular accident) ([place]) 04/14/2022   Paroxysmal atrial fibrillation ([place]) 04/04/2022   Cystocele with prolapse 06/04/2019   Other fatigue 10/17/2017   Shortness of breath on exertion 10/17/2017   Essential hypertension 10/17/2017   Prediabetes 10/17/2017   CHRONIC RHINITIS 11/17/2008   COUGH 08/13/2008   Unspecified glaucoma 07/18/2008   Asthma 07/18/2008   GERD 07/18/2008   Past Medical History:  Past Medical History:  Diagnosis Date   A-fib ([place])    Asthma    controlled well   Bladder prolapse, female, acquired    Edema extremities    lower legs at times   Gastric ulcer    GERD (gastroesophageal reflux disease)    Glaucoma    High blood pressure    OAB (overactive bladder)    Pre-diabetes    Prediabetes    Past Surgical History:  Past Surgical History:  Procedure Laterality Date   ABDOMINAL HYSTERECTOMY  1993   ANTERIOR AND POSTERIOR REPAIR N/A 06/04/2019   Procedure: CYSTOSCOPY ANTERIOR (CYSTOCELE);  Surgeon: Bjorn Loser, MD;  Location: [facility];  Service: Urology;  Laterality: N/A;   APPENDECTOMY     CATARACT EXTRACTION  2016   CYSTECTOMY     1994 and 1982   INTERSTIM IMPLANT PLACEMENT  04/10/2012   Procedure: Barrie Lyme IMPLANT FIRST STAGE;  Surgeon: [name], MD;  Location: [facility];  Service: Urology;  Laterality: N/A;  rad tech ok per vicki at main    INTERSTIM IMPLANT PLACEMENT  04/10/2012   Procedure: Barrie Lyme IMPLANT SECOND STAGE;  Surgeon: [name], MD;  Location: [facility];  Service: Urology;  Laterality: N/A;   LOWER LEG SOFT TISSUE TUMOR EXCISION  1994   cyst left ankle -involved muscle  removed-limited mobility now   NASAL SEPTUM SURGERY     PUBOVAGINAL SLING N/A 06/04/2019   Procedure: Gaynelle Arabian;  Surgeon: Bjorn Loser, MD;  Location: [facility];  Service: Urology;  Laterality: N/A;   HPI:  Brandy Love is a 66 year old female who presented to ED with LUE and left facial weakness/numbness.  PMHx atrial fibrillation, glaucoma, asthma, GERD, hypertension, bladder prolapse. MR brain showed a 6 mm nonhemorrhagic acute/subacute infarct of the right thalamus.   Assessment / Plan / Recommendation Clinical Impression  Pt presents with normal attention, awareness, reasoning, and short-term memory. Speech/language are WNL. Pt reports mild numbness persisting left side of face. No dysarthria. No SLP f/u is recommended. Pt is being D/Cd from the ED this afternoon.    SLP Assessment  SLP Recommendation/Assessment: Patient does not need any further Speech [facility] SLP Visit Diagnosis: Cognitive communication deficit (R41.841)    Recommendations for follow up therapy are one component of a multi-disciplinary discharge planning process, led by the attending physician.  Recommendations may be updated based on patient status, additional functional criteria and insurance authorization.    Follow Up Recommendations  No SLP follow up                       SLP Evaluation Cognition  Overall Cognitive Status: Within Functional Limits for tasks assessed Arousal/Alertness: Awake/alert Orientation Level: Oriented X4 Attention: Alternating Alternating Attention: Appears intact Memory: Appears intact Awareness:  Appears intact Problem Solving: Appears intact Safety/Judgment: Appears intact       Comprehension  Auditory Comprehension Overall Auditory Comprehension: Appears within functional limits for tasks assessed    Expression Expression Primary Mode of Expression: Verbal Verbal Expression Overall Verbal Expression: Appears within functional limits  for tasks assessed Written Expression Dominant Hand: Right Written Expression: Not tested   Oral / Motor  Oral Motor/Sensory Function Overall Oral Motor/Sensory Function: Within functional limits Motor Speech Overall Motor Speech: Appears within functional limits for tasks assessed            Juan Quam Laurice 04/15/2022, 2:45 PM  Shanayah Kaffenberger L. Tivis Ringer, MA CCC/SLP Clinical Specialist - [employer] Office number [phone]

## 2022-04-15 NOTE — Progress Notes (Addendum)
STROKE TEAM PROGRESS NOTE   INTERVAL HISTORY Her daughter is at the bedside.  Prior to admission, she was feeling left sided weakness and noticed facial droop. She came to the ED om 10/04. She had a HA at the time. She had a severe HA last night. Reporting numbness in her left cheek.  Vitals:   04/15/22 0530 04/15/22 0600 04/15/22 0711 04/15/22 1117  BP: (!) 118/52 (!) 125/58    Pulse: 65 66    Resp: 17 (!) 22    Temp:   97.7 F (36.5 C) 98.3 F (36.8 C)  TempSrc:   Oral Oral  SpO2: 100% 97%     CBC:  Recent Labs  Lab 04/13/22 1724  WBC 6.9  NEUTROABS 2.8  HGB 13.7  HCT 41.2  MCV 86.4  PLT 025   Basic Metabolic Panel:  Recent Labs  Lab 04/13/22 1724  NA 139  K 4.1  CL 103  CO2 26  GLUCOSE 106*  BUN 13  CREATININE 1.05*  CALCIUM 9.5   Lipid Panel:  Recent Labs  Lab 04/14/22 2218  CHOL 181  TRIG 65  HDL 49  CHOLHDL 3.7  VLDL 13  LDLCALC 119*   HgbA1c:  Recent Labs  Lab 04/14/22 2218  HGBA1C 5.6   Urine Drug Screen: No results for input(s): "LABOPIA", "COCAINSCRNUR", "LABBENZ", "AMPHETMU", "THCU", "LABBARB" in the last 168 hours.  Alcohol Level No results for input(s): "ETH" in the last 168 hours.  IMAGING past 24 hours CT ANGIO HEAD NECK W WO CM  Result Date: 04/15/2022 CLINICAL DATA:  Stroke workup. EXAM: CT ANGIOGRAPHY HEAD AND NECK TECHNIQUE: Multidetector CT imaging of the head and neck was performed using the standard protocol during bolus administration of intravenous contrast. Multiplanar CT image reconstructions and MIPs were obtained to evaluate the vascular anatomy. Carotid stenosis measurements (when applicable) are obtained utilizing NASCET criteria, using the distal internal carotid diameter as the denominator. RADIATION DOSE REDUCTION: This exam was performed according to the departmental dose-optimization program which includes automated exposure control, adjustment of the mA and/or kV according to patient size and/or use of iterative  reconstruction technique. CONTRAST:  51m OMNIPAQUE IOHEXOL 350 MG/ML SOLN COMPARISON:  CT head from 2 days prior, brain MRI from 1 day prior FINDINGS: CT HEAD FINDINGS Brain: The small infarct in the right thalamus seen on the prior brain MRI is not well seen on the current study. There is no evidence of new acute infarct. There is no acute intracranial hemorrhage or extra-axial fluid collection. Parenchymal volume is normal. The ventricles are normal in size. Gray-white differentiation is preserved There is no mass lesion.  There is no mass effect or midline shift. Vascular: See below. Skull: Normal. Negative for fracture or focal lesion. Sinuses/Orbits: The paranasal sinuses are clear. Bilateral lens implants are in place. The globes and orbits are otherwise unremarkable. Other: None. Review of the MIP images confirms the above findings CTA NECK FINDINGS Aortic arch: The imaged aortic arch is normal. The origins of the major branch vessels are patent. The subclavian arteries are patent to the level imaged. Right carotid system: The right common, internal, and external carotid arteries are patent, with minimal plaque of the bifurcation but no hemodynamically significant stenosis or occlusion. There is no dissection or aneurysm. Left carotid system: The left common, internal, and external carotid arteries are patent, with minimal plaque at the bifurcation but no hemodynamically significant stenosis or occlusion. There is no dissection or aneurysm. Vertebral arteries: The vertebral arteries are  patent, without hemodynamically significant stenosis or occlusion. There is no dissection or aneurysm. Skeleton: There is no acute osseous abnormality or suspicious osseous lesion. There is no visible canal hematoma. Other neck: Soft tissues of the neck are unremarkable. Upper chest: The imaged lung apices are clear. Review of the MIP images confirms the above findings CTA HEAD FINDINGS Anterior circulation: The intracranial  ICAs are patent. The bilateral MCAs are patent The bilateral ACAs are patent. The anterior communicating artery is normal. There is no aneurysm or AVM. Posterior circulation: The bilateral V4 segments are patent. The basilar artery is patent. The major cerebellar arteries are patent. The bilateral PCAs are patent. Bilateral posterior communicating arteries are identified. There is no aneurysm or AVM. Venous sinuses: Patent. Anatomic variants: None. Review of the MIP images confirms the above findings IMPRESSION: 1. The small right thalamic infarcts seen on the prior brain MRI is not well seen on the current study. No evidence of new acute intracranial pathology. 2. Patent vasculature of the head and neck with no hemodynamically significant stenosis or occlusion. Electronically Signed   By: Valetta Mole M.D.   On: 04/15/2022 10:52   ECHOCARDIOGRAM COMPLETE  Result Date: 04/15/2022    ECHOCARDIOGRAM REPORT   Patient Name:   Brandy Love Date of Exam: 04/15/2022 Medical Rec #:  [id number]     Height:       67.0 in Accession #:    [id number]    Weight:       230.0 lb Date of Birth:  [birth year]    BSA:          2.146 m Patient Age:    66 years      BP:           125/58 mmHg Patient Gender: F             HR:           59 bpm. Exam Location:  Inpatient Procedure: 2D Echo, Cardiac Doppler and Color Doppler Indications:    Stroke I63.9  History:        Patient has prior history of Echocardiogram examinations, most                 recent 03/02/2022. Stroke, Arrythmias:Atrial Fibrillation,                 Signs/Symptoms:Shortness of Breath; Risk Factors:Hypertension                 and Diabetes.  Sonographer:    Ronny Flurry Referring Phys: [id number] [place]  1. Left ventricular ejection fraction, by estimation, is 55 to 60%. The left ventricle has normal function. The left ventricle has no regional wall motion abnormalities. Left ventricular diastolic parameters are consistent with Grade I  diastolic dysfunction (impaired relaxation).  2. Right ventricular systolic function is normal. The right ventricular size is normal. Tricuspid regurgitation signal is inadequate for assessing PA pressure.  3. Left atrial size was mildly dilated.  4. The mitral valve is grossly normal. Trivial mitral valve regurgitation.  5. The aortic valve was not well visualized. Aortic valve regurgitation is not visualized. Aortic valve sclerosis/calcification is present, without any evidence of aortic stenosis.  6. The inferior vena cava is normal in size with greater than 50% respiratory variability, suggesting right atrial pressure of 3 mmHg. Comparison(s): Compared to prior TTE in 03/02/22, the MR appears less. Otherwise, there is no significant change. Conclusion(s)/Recommendation(s): No intracardiac source of embolism detected on this  transthoracic study. Consider a transesophageal echocardiogram to exclude cardiac source of embolism if clinically indicated. FINDINGS  Left Ventricle: Left ventricular ejection fraction, by estimation, is 55 to 60%. The left ventricle has normal function. The left ventricle has no regional wall motion abnormalities. The left ventricular internal cavity size was normal in size. There is  no left ventricular hypertrophy. Left ventricular diastolic parameters are consistent with Grade I diastolic dysfunction (impaired relaxation). Right Ventricle: The right ventricular size is normal. No increase in right ventricular wall thickness. Right ventricular systolic function is normal. Tricuspid regurgitation signal is inadequate for assessing PA pressure. Left Atrium: Left atrial size was mildly dilated. Right Atrium: Right atrial size was normal in size. Pericardium: There is no evidence of pericardial effusion. Mitral Valve: The mitral valve is grossly normal. Mild mitral annular calcification. Trivial mitral valve regurgitation. Tricuspid Valve: The tricuspid valve is normal in structure. Tricuspid  valve regurgitation is not demonstrated. Aortic Valve: The aortic valve was not well visualized. Aortic valve regurgitation is not visualized. Aortic valve sclerosis/calcification is present, without any evidence of aortic stenosis. Aortic valve mean gradient measures 7.0 mmHg. Aortic valve peak gradient measures 12.1 mmHg. Aortic valve area, by VTI measures 2.26 cm. Pulmonic Valve: The pulmonic valve was normal in structure. Pulmonic valve regurgitation is trivial. Aorta: The aortic root is normal in size and structure. Venous: The inferior vena cava is normal in size with greater than 50% respiratory variability, suggesting right atrial pressure of 3 mmHg. IAS/Shunts: The atrial septum is grossly normal.  LEFT VENTRICLE PLAX 2D LVIDd:         4.70 cm   Diastology LVIDs:         3.10 cm   LV e' medial:    6.53 cm/s LV PW:         0.90 cm   LV E/e' medial:  14.1 LV IVS:        0.80 cm   LV e' lateral:   7.72 cm/s LVOT diam:     2.00 cm   LV E/e' lateral: 11.9 LV SV:         88 LV SV Index:   41 LVOT Area:     3.14 cm  RIGHT VENTRICLE             IVC RV S prime:     13.60 cm/s  IVC diam: 1.90 cm TAPSE (M-mode): 1.9 cm LEFT ATRIUM             Index        RIGHT ATRIUM           Index LA diam:        3.80 cm 1.77 cm/m   RA Area:     18.50 cm LA Vol (A2C):   58.2 ml 27.12 ml/m  RA Volume:   43.10 ml  20.08 ml/m LA Vol (A4C):   36.9 ml 17.19 ml/m LA Biplane Vol: 48.9 ml 22.79 ml/m  AORTIC VALVE AV Area (Vmax):    2.26 cm AV Area (Vmean):   2.23 cm AV Area (VTI):     2.26 cm AV Vmax:           174.00 cm/s AV Vmean:          117.000 cm/s AV VTI:            0.391 m AV Peak Grad:      12.1 mmHg AV Mean Grad:      7.0 mmHg LVOT Vmax:  125.00 cm/s LVOT Vmean:        83.050 cm/s LVOT VTI:          0.281 m LVOT/AV VTI ratio: 0.72  AORTA Ao Root diam: 3.30 cm Ao Asc diam:  2.80 cm MITRAL VALVE MV Area (PHT): 3.48 cm     SHUNTS MV Decel Time: 218 msec     Systemic VTI:  0.28 m MV E velocity: 92.00 cm/s    Systemic Diam: 2.00 cm MV A velocity: 104.00 cm/s MV E/A ratio:  0.88 Gwyndolyn Kaufman MD Electronically signed by Gwyndolyn Kaufman MD Signature Date/Time: 04/15/2022/10:31:16 AM    Final    MR BRAIN WO CONTRAST  Result Date: 04/14/2022 CLINICAL DATA:  Neuro deficit, acute, stroke suspected. Numbness involving the left upper extremity left face. EXAM: MRI HEAD WITHOUT CONTRAST TECHNIQUE: Multiplanar, multiecho pulse sequences of the brain and surrounding structures were obtained without intravenous contrast. COMPARISON:  CT head 04/13/2022 FINDINGS: Brain: A 6 mm nonhemorrhagic acute/subacute infarct is present in the lateral right thalamus. No other acute infarct is present. Scattered subcortical T2 hyperintensities are mildly advanced for age. Mild generalized atrophy is present. Ventricles are of normal size. No significant extraaxial fluid collection is present. Dilated perivascular spaces are present the basal ganglia. Minimal white matter changes are present within the brainstem. The cerebellum is within normal limits. Vascular: Flow is present in the major intracranial arteries. Skull and upper cervical spine: The craniocervical junction is normal. Upper cervical spine is within normal limits. Marrow signal is unremarkable. Sinuses/Orbits: The paranasal sinuses and mastoid air cells are clear. Bilateral lens replacements are noted. Globes and orbits are otherwise unremarkable. IMPRESSION: 1. 6 mm nonhemorrhagic acute/subacute infarct of the lateral right thalamus. 2. Scattered subcortical T2 hyperintensities bilaterally are mildly advanced for age. The finding is nonspecific but can be seen in the setting of chronic microvascular ischemia, a demyelinating process such as multiple sclerosis, vasculitis, complicated migraine headaches, or as the sequelae of a prior infectious or inflammatory process. These results were called by telephone at the time of interpretation on 04/14/2022 at 9:03 pm to provider  Dr. Nechama Guard, who verbally acknowledged these results. Electronically Signed   By: San Morelle M.D.   On: 04/14/2022 21:03    PHYSICAL EXAM Mental Status -  Level of arousal and orientation to time, place, and person were intact. Language including expression, naming, repetition, comprehension was assessed and found intact. Fund of Knowledge was assessed and was intact.   Cranial Nerves II - XII - II - Visual field intact OU. III, IV, VI - Extraocular movements intact. V - Facial sensation intact bilaterally. VII - Facial movement intact bilaterally. VIII - Hearing & vestibular intact bilaterally. X - Palate elevates symmetrically. XI - Chin turning & shoulder shrug intact bilaterally. XII - Tongue protrusion intact.   Motor Strength - The patient's strength was normal in RUE, RLE, LUE, and LLE. Bulk was normal and fasciculations were absent.   Motor Tone - Muscle tone was assessed at the neck and appendages and was normal.   Sensory - Light touch was more sensitive on the right side compared to the left side for the upper extremities. Lower extremity sensitivity is normal bilaterally.    ASSESSMENT/PLAN NYKIRA REDDIX is a 66 y.o. female with a history of atrial fibrillation on anticoagulation who presents with left-sided numbness and several episodes of weakness.   Stroke:  lateral right thalamus, likely due to small vessel disease   CT head No acute abnormality.  CTA head & neck show no evidence of new acute intracranial pathology. MRI brain show 6 mm nonhemorrhagic acute to subacute infarct of the right thalamus 2D Echo EF 55-60%, aortic valve sclerosis/calcification is present LDL 119 HgbA1c 5.6 VTE prophylaxis - xarelto  Xarelto (missed 2 days) prior to admission, now on aspirin 81 mg and xarelto '20mg'$  daily. Can continue on aspirin and xarelto. Therapy recommendations:  none Disposition:  home  Atrial fibrillation - Continue home metoprolol - Continue home  Xarelto  Hypertension Home meds:  home metoprolol,  losartan and chlorthalidone Stable Continuing metaprolol Long-term BP goal normotensive  Hyperlipidemia Home meds:  none LDL 119, goal < 70 Start lipitor 80 mg Continue statin at discharge  Other Stroke Risk Factors Advanced Age >/= 24  Obesity, BMI >/= 30 associated with increased stroke risk, recommend weight loss, diet and exercise as appropriate  Afib Headache history  Other Active Problems   Asthma - Continue home Flovent and as needed albuterol   GERD - Continue home PPI  Hospital day # 0  Stormy Fabian, MD PGY-1 Psychiatry   ATTENDING NOTE: I reviewed above note and agree with the assessment and plan. Pt was seen and examined.   66 year old female with history of A-fib on Xarelto, prediabetes admitted for left-sided numbness and weakness, on and off.  Symptoms has improved.  CT no acute abnormality.  MRI concerning for right small thalamic infarct.  CTA head and neck unremarkable, EF 55 to 60%.  LDL 119, A1c 5.6, creatinine 1.05.  On exam, daughter at bedside, patient neurologically intact except mild decreased light touch sensation on the left cheek and the left arm.  Etiology for current stroke likely due to small vessel disease given location.  We will continue Xarelto for stroke prevention due to A-fib, will add aspirin 81 on top of Xarelto for stroke prevention.  Add Lipitor 80 for stroke prevention.  Continue metoprolol.  PT/OT no recommendation.  For detailed assessment and plan, please refer to above/below as I have made changes wherever appropriate.   Neurology will sign off. Please call with questions. Pt will follow up with stroke clinic NP at [facility] in about 4 weeks. Thanks for the consult.  Rosalin Hawking, MD PhD Stroke Neurology 04/15/2022 11:51 PM     To contact Stroke Continuity provider, please refer to [email or web address] After hours, contact General Neurology

## 2022-04-15 NOTE — ED Notes (Signed)
Patient has been alert and oriented X4, denies any new concerns or sensations, has ambulated to the restroom, denies any issues swallowing, family present at bedside, will continue to monitor.

## 2022-04-15 NOTE — Discharge Summary (Signed)
Physician Discharge Summary  Brandy Love TXM:[id number] DOB: 12/21/1955 DOA: 04/13/2022  PCP: Brandy Love  Admit date: 04/13/2022 Discharge date: 04/15/2022  Admitted From: Home Disposition: Home  Recommendations for Outpatient Follow-up:  Follow up with PCP in 1 week Outpatient follow-up with neurology Follow up in ED if symptoms worsen or new appear   Home Health: No Equipment/Devices: None  Discharge Condition: Stable CODE STATUS: Full Diet recommendation: Heart healthy  Brief/Interim Summary:  66 y.o. female with medical history significant of glaucoma, asthma, GERD, hypertension, paroxysmal atrial fibrillation, bladder prolapse presented with left sided numbness and weakness.  Patient apparently missed 2 doses of Xarelto at home.  On presentation, CT head and C-spine showed no acute abnormality.  MRI brain  showed a 6 mm nonhemorrhagic acute/subacute infarct of the right thalamus.  Neurology was consulted.  He was started on aspirin and statin on top of Xarelto.  During the hospitalization, symptoms are improving.  She has tolerated PT.  Neurology has cleared the patient for discharge home.  Discharge home today with outpatient follow-up with neurology.  Discharge Diagnoses:   Acute CVA presenting with left-sided weakness and numbness -Possibly secondary to small vessel disease as per neurology.  Imaging as above -Currently on aspirin, statin and Xarelto has been resumed.  LDL 119.  A1c 5.6. -Symptoms improving.  Tolerated PT. -Echo showed EF of 55 to 60% with grade 1 diastolic dysfunction. -Neurology has cleared the patient for discharge home.  Discharge home today with outpatient follow-up with neurology. -Continue aspirin, statin and Xarelto on discharge.    Paroxysmal A-fib -Continue metoprolol, Multaq and Xarelto.  Outpatient follow-up with PCP/cardiology  Hypertension -Blood pressure currently controlled.  Metoprolol being continued.  Will keep chlorthalidone  on hold till reevaluation by PCP.  Can resume losartan after week: Allowing for permissive hypertension  Asthma -Stable.  Continue home regimen  Obesity -Outpatient follow-up   Discharge Instructions   Allergies as of 04/15/2022       Reactions   Shellfish Allergy Anaphylaxis, Nausea Only, Swelling, Other (See Comments)   Swelling of the throat   Erythromycin Hives   Ibuprofen Other (See Comments)   Vertigo   Sulfa Antibiotics Hives   Adhesive [tape] Rash, Other (See Comments)   Rash with transderm   Amlodipine Hives, Swelling, Rash   Betadine [povidone Iodine] Rash, Other (See Comments)   ALL TOPICAL IODINES   Povidone-iodine Rash, Other (See Comments)   ALL TOPICAL IODINES   Silicone Rash, Other (See Comments)   Rash with transderm        Medication List     STOP taking these medications    chlorthalidone 25 MG tablet Commonly known as: HYGROTON   hydrochlorothiazide 25 MG tablet Commonly known as: HYDRODIURIL       TAKE these medications    albuterol 108 (90 Base) MCG/ACT inhaler Commonly known as: VENTOLIN HFA Inhale 2 puffs into the lungs every 6 (six) hours as needed for wheezing or shortness of breath.   aspirin EC 81 MG tablet Take 1 tablet (81 mg total) by mouth daily. Swallow whole. Start taking on: April 16, 2022   atorvastatin 80 MG tablet Commonly known as: LIPITOR Take 1 tablet (80 mg total) by mouth daily.   clotrimazole-betamethasone cream Commonly known as: LOTRISONE Apply 1 Application topically 2 (two) times daily as needed (for irritation).   fluticasone 110 MCG/ACT inhaler Commonly known as: FLOVENT HFA Inhale 2 puffs into the lungs 2 (two) times daily as needed (for seasonal  flares/asthma).   hyoscyamine 0.125 MG SL tablet Commonly known as: LEVSIN SL Place 0.125 mg under the tongue 2 (two) times daily as needed for cramping.   losartan 100 MG tablet Commonly known as: COZAAR Take 1 tablet (100 mg total) by mouth daily.  Restart from 04/22/22 Start taking on: April 22, 2022 What changed:  additional instructions These instructions start on April 22, 2022. If you are unsure what to do until then, ask your doctor or other care provider.   metoprolol tartrate 25 MG tablet Commonly known as: LOPRESSOR Take 0.5 tablets (12.5 mg total) by mouth 2 (two) times daily.   Multaq 400 MG tablet Generic drug: dronedarone Take 1 tablet (400 mg total) by mouth 2 (two) times daily with a meal.   omeprazole 40 MG capsule Commonly known as: PRILOSEC Take 40 mg by mouth daily before breakfast.   rivaroxaban 20 MG Tabs tablet Commonly known as: XARELTO Take 1 tablet (20 mg total) by mouth daily with supper.   sucralfate 1 g tablet Commonly known as: CARAFATE Take 1 g by mouth daily as needed (to coat the stomach).   triamcinolone 0.1 % paste Commonly known as: KENALOG 1 Application 2 (two) times daily as needed (for fever blisters- apply to the lips).   TYLENOL 500 MG tablet Generic drug: acetaminophen Take 500-1,000 mg by mouth every 6 (six) hours as needed for mild pain or headache.         Follow-up Information     Brandy Love. Schedule an appointment as soon as possible for a visit in 1 week(s).   Specialty: Family Medicine Contact information: [place] [address] [phone]                Allergies  Allergen Reactions   Shellfish Allergy Anaphylaxis, Nausea Only, Swelling and Other (See Comments)    Swelling of the throat   Erythromycin Hives   Ibuprofen Other (See Comments)    Vertigo   Sulfa Antibiotics Hives   Adhesive [Tape] Rash and Other (See Comments)    Rash with transderm   Amlodipine Hives, Swelling and Rash   Betadine [Povidone Iodine] Rash and Other (See Comments)    ALL TOPICAL IODINES   Povidone-Iodine Rash and Other (See Comments)    ALL TOPICAL IODINES   Silicone Rash and Other (See Comments)    Rash with transderm     Consultations: Neurology   Procedures/Studies: ECHOCARDIOGRAM COMPLETE  Result Date: 04/15/2022    ECHOCARDIOGRAM REPORT   Patient Name:   Brandy Love Date of Exam: 04/15/2022 Medical Rec #:  [id number]     Height:       67.0 in Accession #:    [id number]    Weight:       230.0 lb Date of Birth:  [birth year]    BSA:          2.146 m Patient Age:    66 years      BP:           125/58 mmHg Patient Gender: F             HR:           59 bpm. Exam Location:  Inpatient Procedure: 2D Echo, Cardiac Doppler and Color Doppler Indications:    Stroke I63.9  History:        Patient has prior history of Echocardiogram examinations, most  recent 03/02/2022. Stroke, Arrythmias:Atrial Fibrillation,                 Signs/Symptoms:Shortness of Breath; Risk Factors:Hypertension                 and Diabetes.  Sonographer:    Ronny Flurry Referring Phys: [id number] [place]  1. Left ventricular ejection fraction, by estimation, is 55 to 60%. The left ventricle has normal function. The left ventricle has no regional wall motion abnormalities. Left ventricular diastolic parameters are consistent with Grade I diastolic dysfunction (impaired relaxation).  2. Right ventricular systolic function is normal. The right ventricular size is normal. Tricuspid regurgitation signal is inadequate for assessing PA pressure.  3. Left atrial size was mildly dilated.  4. The mitral valve is grossly normal. Trivial mitral valve regurgitation.  5. The aortic valve was not well visualized. Aortic valve regurgitation is not visualized. Aortic valve sclerosis/calcification is present, without any evidence of aortic stenosis.  6. The inferior vena cava is normal in size with greater than 50% respiratory variability, suggesting right atrial pressure of 3 mmHg. Comparison(s): Compared to prior TTE in 03/02/22, the MR appears less. Otherwise, there is no significant change. Conclusion(s)/Recommendation(s): No  intracardiac source of embolism detected on this transthoracic study. Consider a transesophageal echocardiogram to exclude cardiac source of embolism if clinically indicated. FINDINGS  Left Ventricle: Left ventricular ejection fraction, by estimation, is 55 to 60%. The left ventricle has normal function. The left ventricle has no regional wall motion abnormalities. The left ventricular internal cavity size was normal in size. There is  no left ventricular hypertrophy. Left ventricular diastolic parameters are consistent with Grade I diastolic dysfunction (impaired relaxation). Right Ventricle: The right ventricular size is normal. No increase in right ventricular wall thickness. Right ventricular systolic function is normal. Tricuspid regurgitation signal is inadequate for assessing PA pressure. Left Atrium: Left atrial size was mildly dilated. Right Atrium: Right atrial size was normal in size. Pericardium: There is no evidence of pericardial effusion. Mitral Valve: The mitral valve is grossly normal. Mild mitral annular calcification. Trivial mitral valve regurgitation. Tricuspid Valve: The tricuspid valve is normal in structure. Tricuspid valve regurgitation is not demonstrated. Aortic Valve: The aortic valve was not well visualized. Aortic valve regurgitation is not visualized. Aortic valve sclerosis/calcification is present, without any evidence of aortic stenosis. Aortic valve mean gradient measures 7.0 mmHg. Aortic valve peak gradient measures 12.1 mmHg. Aortic valve area, by VTI measures 2.26 cm. Pulmonic Valve: The pulmonic valve was normal in structure. Pulmonic valve regurgitation is trivial. Aorta: The aortic root is normal in size and structure. Venous: The inferior vena cava is normal in size with greater than 50% respiratory variability, suggesting right atrial pressure of 3 mmHg. IAS/Shunts: The atrial septum is grossly normal.  LEFT VENTRICLE PLAX 2D LVIDd:         4.70 cm   Diastology LVIDs:          3.10 cm   LV e' medial:    6.53 cm/s LV PW:         0.90 cm   LV E/e' medial:  14.1 LV IVS:        0.80 cm   LV e' lateral:   7.72 cm/s LVOT diam:     2.00 cm   LV E/e' lateral: 11.9 LV SV:         88 LV SV Index:   41 LVOT Area:     3.14 cm  RIGHT VENTRICLE  IVC RV S prime:     13.60 cm/s  IVC diam: 1.90 cm TAPSE (M-mode): 1.9 cm LEFT ATRIUM             Index        RIGHT ATRIUM           Index LA diam:        3.80 cm 1.77 cm/m   RA Area:     18.50 cm LA Vol (A2C):   58.2 ml 27.12 ml/m  RA Volume:   43.10 ml  20.08 ml/m LA Vol (A4C):   36.9 ml 17.19 ml/m LA Biplane Vol: 48.9 ml 22.79 ml/m  AORTIC VALVE AV Area (Vmax):    2.26 cm AV Area (Vmean):   2.23 cm AV Area (VTI):     2.26 cm AV Vmax:           174.00 cm/s AV Vmean:          117.000 cm/s AV VTI:            0.391 m AV Peak Grad:      12.1 mmHg AV Mean Grad:      7.0 mmHg LVOT Vmax:         125.00 cm/s LVOT Vmean:        83.050 cm/s LVOT VTI:          0.281 m LVOT/AV VTI ratio: 0.72  AORTA Ao Root diam: 3.30 cm Ao Asc diam:  2.80 cm MITRAL VALVE MV Area (PHT): 3.48 cm     SHUNTS MV Decel Time: 218 msec     Systemic VTI:  0.28 m MV E velocity: 92.00 cm/s   Systemic Diam: 2.00 cm MV A velocity: 104.00 cm/s MV E/A ratio:  0.88 Gwyndolyn Kaufman Love Electronically signed by Gwyndolyn Kaufman Love Signature Date/Time: 04/15/2022/10:31:16 AM    Final    MR BRAIN WO CONTRAST  Result Date: 04/14/2022 CLINICAL DATA:  Neuro deficit, acute, stroke suspected. Numbness involving the left upper extremity left face. EXAM: MRI HEAD WITHOUT CONTRAST TECHNIQUE: Multiplanar, multiecho pulse sequences of the brain and surrounding structures were obtained without intravenous contrast. COMPARISON:  CT head 04/13/2022 FINDINGS: Brain: A 6 mm nonhemorrhagic acute/subacute infarct is present in the lateral right thalamus. No other acute infarct is present. Scattered subcortical T2 hyperintensities are mildly advanced for age. Mild generalized atrophy is present.  Ventricles are of normal size. No significant extraaxial fluid collection is present. Dilated perivascular spaces are present the basal ganglia. Minimal white matter changes are present within the brainstem. The cerebellum is within normal limits. Vascular: Flow is present in the major intracranial arteries. Skull and upper cervical spine: The craniocervical junction is normal. Upper cervical spine is within normal limits. Marrow signal is unremarkable. Sinuses/Orbits: The paranasal sinuses and mastoid air cells are clear. Bilateral lens replacements are noted. Globes and orbits are otherwise unremarkable. IMPRESSION: 1. 6 mm nonhemorrhagic acute/subacute infarct of the lateral right thalamus. 2. Scattered subcortical T2 hyperintensities bilaterally are mildly advanced for age. The finding is nonspecific but can be seen in the setting of chronic microvascular ischemia, a demyelinating process such as multiple sclerosis, vasculitis, complicated migraine headaches, or as the sequelae of a prior infectious or inflammatory process. These results were called by telephone at the time of interpretation on 04/14/2022 at 9:03 pm to provider Dr. Nechama Guard, who verbally acknowledged these results. Electronically Signed   By: San Morelle M.D.   On: 04/14/2022 21:03   DG Chest 2 View  Result  Date: 04/13/2022 CLINICAL DATA:  Left arm weakness and numbness EXAM: CHEST - 2 VIEW COMPARISON:  02/04/2022 FINDINGS: The heart size and mediastinal contours are within normal limits. Both lungs are clear. The visualized skeletal structures are unremarkable. IMPRESSION: No active cardiopulmonary disease. Electronically Signed   By: Elmer Picker M.D.   On: 04/13/2022 19:58   CT HEAD WO CONTRAST (5MM)  Result Date: 04/13/2022 CLINICAL DATA:  Left-sided weakness, initial encounter EXAM: CT HEAD WITHOUT CONTRAST CT CERVICAL SPINE WITHOUT CONTRAST TECHNIQUE: Multidetector CT imaging of the head and cervical spine was  performed following the standard protocol without intravenous contrast. Multiplanar CT image reconstructions of the cervical spine were also generated. RADIATION DOSE REDUCTION: This exam was performed according to the departmental dose-optimization program which includes automated exposure control, adjustment of the mA and/or kV according to patient size and/or use of iterative reconstruction technique. COMPARISON:  08/27/2021 FINDINGS: CT HEAD FINDINGS Brain: No evidence of acute infarction, hemorrhage, hydrocephalus, extra-axial collection or mass lesion/mass effect. Mild atrophic changes are noted. Vascular: No hyperdense vessel or unexpected calcification. Skull: Normal. Negative for fracture or focal lesion. Sinuses/Orbits: No acute finding. Other: None. CT CERVICAL SPINE FINDINGS Alignment: Within normal limits. Skull base and vertebrae: 7 cervical segments are well visualized. Mild osteophytic changes are seen. Multilevel facet hypertrophic changes are noted. No acute fracture or acute facet abnormality is noted. Soft tissues and spinal canal: Surrounding soft tissue structures are within normal limits. No adenopathy or hematoma is seen. Upper chest: Visualized lung apices are unremarkable. Other: None IMPRESSION: CT of the head: No acute intracranial abnormality noted. CT of the cervical spine: Multilevel degenerative change without acute abnormality. Electronically Signed   By: Inez Catalina M.D.   On: 04/13/2022 19:03   CT Cervical Spine Wo Contrast  Result Date: 04/13/2022 CLINICAL DATA:  Left-sided weakness, initial encounter EXAM: CT HEAD WITHOUT CONTRAST CT CERVICAL SPINE WITHOUT CONTRAST TECHNIQUE: Multidetector CT imaging of the head and cervical spine was performed following the standard protocol without intravenous contrast. Multiplanar CT image reconstructions of the cervical spine were also generated. RADIATION DOSE REDUCTION: This exam was performed according to the departmental  dose-optimization program which includes automated exposure control, adjustment of the mA and/or kV according to patient size and/or use of iterative reconstruction technique. COMPARISON:  08/27/2021 FINDINGS: CT HEAD FINDINGS Brain: No evidence of acute infarction, hemorrhage, hydrocephalus, extra-axial collection or mass lesion/mass effect. Mild atrophic changes are noted. Vascular: No hyperdense vessel or unexpected calcification. Skull: Normal. Negative for fracture or focal lesion. Sinuses/Orbits: No acute finding. Other: None. CT CERVICAL SPINE FINDINGS Alignment: Within normal limits. Skull base and vertebrae: 7 cervical segments are well visualized. Mild osteophytic changes are seen. Multilevel facet hypertrophic changes are noted. No acute fracture or acute facet abnormality is noted. Soft tissues and spinal canal: Surrounding soft tissue structures are within normal limits. No adenopathy or hematoma is seen. Upper chest: Visualized lung apices are unremarkable. Other: None IMPRESSION: CT of the head: No acute intracranial abnormality noted. CT of the cervical spine: Multilevel degenerative change without acute abnormality. Electronically Signed   By: Inez Catalina M.D.   On: 04/13/2022 19:03   SLEEP STUDY DOCUMENTS  Result Date: 04/12/2022 Ordered by an unspecified provider.     Subjective: Patient seen and examined at bedside.  Her symptoms are improving although still has some residual numbness.  Hoping to go home today.  No overnight fever, chest pain, nausea, vomiting reported.  Discharge Exam: Vitals:   04/15/22 0600 04/15/22  0711  BP: (!) 125/58   Pulse: 66   Resp: (!) 22   Temp:  97.7 F (36.5 C)  SpO2: 97%     General: Pt is alert, awake, not in acute distress.  Currently on room air. Cardiovascular: rate controlled, S1/S2 + Respiratory: bilateral decreased breath sounds at bases; intermittent tachypnea present Abdominal: Soft, obese, NT, ND, bowel sounds + Extremities: no  edema, no cyanosis    The results of significant diagnostics from this hospitalization (including imaging, microbiology, ancillary and laboratory) are listed below for reference.     Microbiology: No results found for this or any previous visit (from the past 240 hour(s)).   Labs: BNP (last 3 results) Recent Labs    02/04/22 1937  BNP 65.9   Basic Metabolic Panel: Recent Labs  Lab 04/13/22 1724  NA 139  K 4.1  CL 103  CO2 26  GLUCOSE 106*  BUN 13  CREATININE 1.05*  CALCIUM 9.5   Liver Function Tests: No results for input(s): "AST", "ALT", "ALKPHOS", "BILITOT", "PROT", "ALBUMIN" in the last 168 hours. No results for input(s): "LIPASE", "AMYLASE" in the last 168 hours. No results for input(s): "AMMONIA" in the last 168 hours. CBC: Recent Labs  Lab 04/13/22 1724  WBC 6.9  NEUTROABS 2.8  HGB 13.7  HCT 41.2  MCV 86.4  PLT 214   Cardiac Enzymes: No results for input(s): "CKTOTAL", "CKMB", "CKMBINDEX", "TROPONINI" in the last 168 hours. BNP: Invalid input(s): "POCBNP" CBG: No results for input(s): "GLUCAP" in the last 168 hours. D-Dimer No results for input(s): "DDIMER" in the last 72 hours. Hgb A1c Recent Labs    04/14/22 2218  HGBA1C 5.6   Lipid Profile Recent Labs    04/14/22 2218  CHOL 181  HDL 49  LDLCALC 119*  TRIG 65  CHOLHDL 3.7   Thyroid function studies No results for input(s): "TSH", "T4TOTAL", "T3FREE", "THYROIDAB" in the last 72 hours.  Invalid input(s): "FREET3" Anemia work up No results for input(s): "VITAMINB12", "FOLATE", "FERRITIN", "TIBC", "IRON", "RETICCTPCT" in the last 72 hours. Urinalysis    Component Value Date/Time   COLORURINE YELLOW 09/07/2010 0248   APPEARANCEUR CLOUDY (A) 09/07/2010 0248   LABSPEC 1.024 09/07/2010 0248   PHURINE 6.0 09/07/2010 0248   HGBUR NEGATIVE 09/07/2010 0248   BILIRUBINUR SMALL (A) 09/07/2010 0248   KETONESUR NEGATIVE 09/07/2010 0248   PROTEINUR 30 (A) 09/07/2010 0248   UROBILINOGEN 0.2  09/07/2010 0248   NITRITE NEGATIVE 09/07/2010 0248   LEUKOCYTESUR NEGATIVE 09/07/2010 0248   Sepsis Labs Recent Labs  Lab 04/13/22 1724  WBC 6.9   Microbiology No results found for this or any previous visit (from the past 240 hour(s)).   Time coordinating discharge: 35 minutes  SIGNED:   Aline August, Love  [facility] 04/15/2022, 10:47 AM

## 2022-04-16 ENCOUNTER — Emergency Department
Admission: EM | Admit: 2022-04-16 | Discharge: 2022-04-16 | Disposition: A | Discharge status: Home / Self Care | Payer: Medicare Other | Attending: Emergency Medicine | Admitting: Emergency Medicine

## 2022-04-16 ENCOUNTER — Other Ambulatory Visit: Payer: Self-pay

## 2022-04-16 ENCOUNTER — Emergency Department: Payer: Medicare Other

## 2022-04-16 DIAGNOSIS — N390 Urinary tract infection, site not specified: Secondary | ICD-10-CM | POA: Diagnosis not present

## 2022-04-16 DIAGNOSIS — Y900 Blood alcohol level of less than 20 mg/100 ml: Secondary | ICD-10-CM | POA: Insufficient documentation

## 2022-04-16 DIAGNOSIS — I6381 Other cerebral infarction due to occlusion or stenosis of small artery: Secondary | ICD-10-CM | POA: Insufficient documentation

## 2022-04-16 DIAGNOSIS — Z79899 Other long term (current) drug therapy: Secondary | ICD-10-CM | POA: Insufficient documentation

## 2022-04-16 DIAGNOSIS — Z7901 Long term (current) use of anticoagulants: Secondary | ICD-10-CM | POA: Diagnosis not present

## 2022-04-16 DIAGNOSIS — I4891 Unspecified atrial fibrillation: Secondary | ICD-10-CM | POA: Diagnosis not present

## 2022-04-16 DIAGNOSIS — Z8673 Personal history of transient ischemic attack (TIA), and cerebral infarction without residual deficits: Secondary | ICD-10-CM | POA: Diagnosis not present

## 2022-04-16 DIAGNOSIS — R9082 White matter disease, unspecified: Secondary | ICD-10-CM | POA: Diagnosis not present

## 2022-04-16 DIAGNOSIS — R202 Paresthesia of skin: Secondary | ICD-10-CM | POA: Diagnosis present

## 2022-04-16 LAB — COMPREHENSIVE METABOLIC PANEL
ALT: 17 U/L (ref 0–44)
AST: 21 U/L (ref 15–41)
Albumin: 3.8 g/dL (ref 3.5–5.0)
Alkaline Phosphatase: 72 U/L (ref 38–126)
Anion gap: 4 — ABNORMAL LOW (ref 5–15)
BUN: 16 mg/dL (ref 8–23)
CO2: 29 mmol/L (ref 22–32)
Calcium: 8.8 mg/dL — ABNORMAL LOW (ref 8.9–10.3)
Chloride: 107 mmol/L (ref 98–111)
Creatinine, Ser: 1.1 mg/dL — ABNORMAL HIGH (ref 0.44–1.00)
GFR, Estimated: 56 mL/min — ABNORMAL LOW (ref >60)
Glucose, Bld: 120 mg/dL — ABNORMAL HIGH (ref 70–99)
Potassium: 3.8 mmol/L (ref 3.5–5.1)
Sodium: 140 mmol/L (ref 135–145)
Total Bilirubin: 0.7 mg/dL (ref 0.3–1.2)
Total Protein: 7.1 g/dL (ref 6.5–8.1)

## 2022-04-16 LAB — URINALYSIS, ROUTINE W REFLEX MICROSCOPIC
Bilirubin Urine: NEGATIVE
Glucose, UA: NEGATIVE mg/dL
Ketones, ur: NEGATIVE mg/dL
Nitrite: NEGATIVE
Protein, ur: 30 mg/dL — AB
Specific Gravity, Urine: 1.025 (ref 1.005–1.030)
WBC, UA: >50 WBC/hpf — ABNORMAL HIGH (ref 0–5)
pH: 5 (ref 5.0–8.0)

## 2022-04-16 LAB — APTT: aPTT: 35 seconds (ref 24–36)

## 2022-04-16 LAB — PROTIME-INR
INR: 2.3 — ABNORMAL HIGH (ref 0.8–1.2)
Prothrombin Time: 25.4 seconds — ABNORMAL HIGH (ref 11.4–15.2)

## 2022-04-16 LAB — ETHANOL: Alcohol, Ethyl (B): <10 mg/dL (ref <10)

## 2022-04-16 LAB — URINE DRUG SCREEN, QUALITATIVE (ARMC ONLY)
Amphetamines, Ur Screen: NOT DETECTED
Barbiturates, Ur Screen: NOT DETECTED
Benzodiazepine, Ur Scrn: POSITIVE — AB
Cannabinoid 50 Ng, Ur ~~LOC~~: NOT DETECTED
Cocaine Metabolite,Ur ~~LOC~~: NOT DETECTED
MDMA (Ecstasy)Ur Screen: NOT DETECTED
Methadone Scn, Ur: NOT DETECTED
Opiate, Ur Screen: NOT DETECTED
Phencyclidine (PCP) Ur S: NOT DETECTED
Tricyclic, Ur Screen: NOT DETECTED

## 2022-04-16 LAB — CBC
HCT: 38.6 % (ref 36.0–46.0)
Hemoglobin: 12.6 g/dL (ref 12.0–15.0)
MCH: 28.4 pg (ref 26.0–34.0)
MCHC: 32.6 g/dL (ref 30.0–36.0)
MCV: 87.1 fL (ref 80.0–100.0)
Platelets: 181 10*3/uL (ref 150–400)
RBC: 4.43 MIL/uL (ref 3.87–5.11)
RDW: 13.2 % (ref 11.5–15.5)
WBC: 5.3 10*3/uL (ref 4.0–10.5)
nRBC: 0 % (ref 0.0–0.2)

## 2022-04-16 LAB — DIFFERENTIAL
Abs Immature Granulocytes: 0.01 10*3/uL (ref 0.00–0.07)
Basophils Absolute: 0 10*3/uL (ref 0.0–0.1)
Basophils Relative: 1 %
Eosinophils Absolute: 0.2 10*3/uL (ref 0.0–0.5)
Eosinophils Relative: 3 %
Immature Granulocytes: 0 %
Lymphocytes Relative: 38 %
Lymphs Abs: 2 10*3/uL (ref 0.7–4.0)
Monocytes Absolute: 0.4 10*3/uL (ref 0.1–1.0)
Monocytes Relative: 7 %
Neutro Abs: 2.7 10*3/uL (ref 1.7–7.7)
Neutrophils Relative %: 51 %

## 2022-04-16 MED ORDER — CEPHALEXIN 500 MG PO CAPS: 500.0000 mg | ORAL_CAPSULE | Freq: Two times a day (BID) | ORAL | 0 refills | Status: AC

## 2022-04-16 MED ORDER — LORAZEPAM 2 MG/ML IJ SOLN
0.5000 mg | Freq: Once | INTRAMUSCULAR | Status: AC | PRN
  Administered 2022-04-16: 0.5 mg via INTRAVENOUS
  Filled 2022-04-16: qty 1

## 2022-04-16 MED ORDER — SODIUM CHLORIDE 0.9 % IV SOLN
2.0000 g | Freq: Once | INTRAVENOUS | Status: AC
  Administered 2022-04-16: 2 g via INTRAVENOUS
  Filled 2022-04-16: qty 20

## 2022-04-16 MED ORDER — CEPHALEXIN 500 MG PO CAPS: 500.0000 mg | ORAL_CAPSULE | Freq: Two times a day (BID) | ORAL | 0 refills | Status: DC

## 2022-04-16 NOTE — ED Triage Notes (Signed)
Pt arrives with worsening left side numbness and weakness. States she was evaluated at [facility] on Wednesday for TIA and discharged on Friday. Pt had left side numbness at that time.  Pt reports she had some numbness on left arm yesterday when she went to bed at 2100, and when she woke up today at 0700 the whole left side was numb and weaker. No arm drift in triage

## 2022-04-16 NOTE — ED Provider Notes (Addendum)
[facility] Provider Note    Event Date/Time   First MD Initiated Contact with Patient 04/16/22 [phone]     (approximate)   History   Numbness   HPI  Brandy Love is a 66 y.o. female with recently diagnosed A-fib on Xarelto who was recently admitted on 10/4 with concerns for acute to subacute right thalamic stroke.  MRI showed a 6 mm nonhemorrhagic acute to subacute infarct of the right thalamus.  Patient was started on aspirin and statin on top of her Xarelto.  Patient had CTA as well as echocardiogram that was done she was plan to follow-up outpatient with neurology.  Patient reports that when she was discharged she still had some of the tingling and numbness in her left arm and a little bit on her left face and that she went to bed at 9:30 PM and woke up this morning with significant more numbness of her left arm and now into her left leg and her left face.  She reports some weakness as well on her left side which she reports she did not have earlier.  She reports that she did take her Xarelto yesterday.  She denies any chest pain or shortness of breath. Physical Exam   Triage Vital Signs: Blood pressure (!) 158/76, pulse 75, temperature 98.2 F (36.8 C), temperature source Oral, resp. rate 17, SpO2 98 %.  Most recent vital signs: Vitals:   04/16/22 0929  BP: (!) 158/76  Pulse: 75  Resp: 17  Temp: 98.2 F (36.8 C)  SpO2: 98%     General: Awake, no distress.  CV:  Good peripheral perfusion.  Resp:  Normal effort.  Abd:  No distention.  Other:  Sensation changes on the left side.    ED Results / Procedures / Treatments   Labs (all labs ordered are listed, but only abnormal results are displayed) Labs Reviewed  COMPREHENSIVE METABOLIC PANEL - Abnormal; Notable for the following components:      Result Value   Glucose, Bld 120 (*)    Creatinine, Ser 1.10 (*)    Calcium 8.8 (*)    GFR, Estimated 56 (*)    Anion gap 4 (*)    All other  components within normal limits  ETHANOL  CBC  DIFFERENTIAL  PROTIME-INR  APTT  URINE DRUG SCREEN, QUALITATIVE ([facility] ONLY)  URINALYSIS, ROUTINE W REFLEX MICROSCOPIC     EKG  My interpretation of EKG:  Atrial fibrillation rate of 79 without any ST elevation or T wave inversions except for aVL with normal intervals  RADIOLOGY I have reviewed the CT head personally and interpreted evolving thalamic stroke   PROCEDURES:  Critical Care performed: No  .1-3 Lead EKG Interpretation  Performed by: Vanessa Drumright, MD Authorized by: Vanessa [place], MD     Interpretation: abnormal     ECG rate:  75   ECG rate assessment: normal     Rhythm: atrial fibrillation     Ectopy: none     Conduction: normal      MEDICATIONS ORDERED IN ED: Medications  LORazepam (ATIVAN) injection 0.5 mg (has no administration in time range)     IMPRESSION / MDM / ASSESSMENT AND PLAN / ED COURSE  I reviewed the triage vital signs and the nursing notes.   Patient's presentation is most consistent with acute presentation with potential threat to life or bodily function.   Differential includes stroke, hemorrhagic conversion, MS.  She denies any chest pain or  shortness of breath to suggest ACS or dissection.  Will get repeat CT head.  Holding off on stroke code given patient out of the window for TNK due to time, being on blood thinner, recent stroke.  Does not appear to have LVO based upon examination.  CT head shows evolving right thalamic infarct but no evidence of any hemorrhage  Labs show creatinine that is similar to 3 days ago.  EtOH negative.  CBC normal.  We will discuss the case with neurology  10:38 AM Discussed with neuro Dr. Cheral Marker recommend MRI given pt already has had stroke workup need to see if new infarct or more just same infarct to decide on treatment.    Patient's urine looks kind concerning for possible UTI we will give a dose of ceftriaxone and give her some Keflex for  discharge  IMPRESSION: 1. Acute nonhemorrhagic infarct of the right thalamus is better defined on today's study. This likely represents expected evolution of the infarct rather than any significant progression 2. Remote lacunar infarct of the left thalamus is stable. 3. Scattered white matter disease is otherwise stable. This likely reflects the sequela of chronic microvascular ischemia.    Discussed case with neurology Dr. Cheral Marker -and denies needing any additional changes to patient's work-up and is okay with patient being discharged home.  On repeat evaluation patient reports feeling better her symptoms are slightly resolving.  I considered the possibility of recurrent process from UTI so we will start on medications for that.  We discussed that if she develops change in symptoms however that she should still come to the ER to be reevaluated due to concern that this could turn into a hemorrhagic bleed but at this time there is no signs of that.  She expressed understanding at this time will be discharged home   The patient is on the cardiac monitor to evaluate for evidence of arrhythmia and/or significant heart rate changes.      FINAL CLINICAL IMPRESSION(S) / ED DIAGNOSES   Final diagnoses:  Thalamic stroke ([place])  Urinary tract infection without hematuria, site unspecified     Rx / DC Orders   ED Discharge Orders     None        Note:  This document was prepared using Dragon voice recognition software and may include unintentional dictation errors.   Vanessa [place], MD 04/16/22 1416    Vanessa [place], MD 04/16/22 [phone]

## 2022-04-16 NOTE — ED Notes (Signed)
Patient transported to MRI 

## 2022-04-16 NOTE — Discharge Instructions (Addendum)
We have started you on some antibiotics for possible UTI.  I discussed the case with neurology who does not have any new changes but if you develop changes in symptoms return to the ER for repeat CT head to make sure this is not evolving into a bleed.  There is no evidence of that at this time.  Follow-up with neurology outpatient as planned and make sure you continue to take your blood thinners.   MRI   IMPRESSION: 1. Acute nonhemorrhagic infarct of the right thalamus is better defined on today's study. This likely represents expected evolution of the infarct rather than any significant progression 2. Remote lacunar infarct of the left thalamus is stable. 3. Scattered white matter disease is otherwise stable. This likely reflects the sequela of chronic microvascular ischemia.

## 2022-04-17 ENCOUNTER — Encounter (HOSPITAL_COMMUNITY): Payer: Self-pay

## 2022-04-19 LAB — URINE CULTURE: Culture: 80000 — AB

## 2022-05-03 ENCOUNTER — Ambulatory Visit (HOSPITAL_COMMUNITY): Payer: Medicare Other | Admitting: Nurse Practitioner

## 2022-05-05 ENCOUNTER — Ambulatory Visit (INDEPENDENT_AMBULATORY_CARE_PROVIDER_SITE_OTHER): Payer: Medicare Other | Admitting: Adult Health

## 2022-05-05 ENCOUNTER — Encounter: Payer: Self-pay | Admitting: Adult Health

## 2022-05-05 VITALS — BP 107/48 | HR 49 | Ht 67.0 in | Wt 228.0 lb

## 2022-05-05 DIAGNOSIS — M79609 Pain in unspecified limb: Secondary | ICD-10-CM

## 2022-05-05 DIAGNOSIS — I6381 Other cerebral infarction due to occlusion or stenosis of small artery: Secondary | ICD-10-CM | POA: Diagnosis not present

## 2022-05-05 DIAGNOSIS — R202 Paresthesia of skin: Secondary | ICD-10-CM

## 2022-05-05 DIAGNOSIS — R29898 Other symptoms and signs involving the musculoskeletal system: Secondary | ICD-10-CM | POA: Diagnosis not present

## 2022-05-05 DIAGNOSIS — Z09 Encounter for follow-up examination after completed treatment for conditions other than malignant neoplasm: Secondary | ICD-10-CM | POA: Diagnosis not present

## 2022-05-05 NOTE — Progress Notes (Signed)
I agree with the above plan 

## 2022-05-05 NOTE — Patient Instructions (Addendum)
Referral placed to OT for hopeful further recovery  Continue aspirin 81 mg daily and Xarelto (rivaroxaban) daily  and atorvastatin  for secondary stroke prevention  Continue to follow up with PCP regarding cholesterol and blood pressure management  Maintain strict control of hypertension with blood pressure goal below 130/90 and cholesterol with LDL cholesterol (bad cholesterol) goal below 70 mg/dL.   Signs of a Stroke? Follow the BEFAST method:  Balance Watch for a sudden loss of balance, trouble with coordination or vertigo Eyes Is there a sudden loss of vision in one or both eyes? Or double vision?  Face: Ask the person to smile. Does one side of the face droop or is it numb?  Arms: Ask the person to raise both arms. Does one arm drift downward? Is there weakness or numbness of a leg? Speech: Ask the person to repeat a simple phrase. Does the speech sound slurred/strange? Is the person confused ? Time: If you observe any of these signs, call 911.       Thank you for coming to see [place] at [facility]. I hope we have been able to provide you high quality care today.  You may receive a patient satisfaction survey over the next few weeks. We would appreciate your feedback and comments so that we may continue to improve ourselves and the health of our patients.   Stroke Prevention Some medical conditions and lifestyle choices can lead to a higher risk for a stroke. You can help to prevent a stroke by eating healthy foods and exercising. It also helps to not smoke and to manage any health problems you may have. How can this condition affect me? A stroke is an emergency. It should be treated right away. A stroke can lead to brain damage or threaten your life. There is a better chance of surviving and getting better after a stroke if you get medical help right away. What can increase my risk? The following medical conditions may increase your risk of a stroke: Diseases of  the heart and blood vessels (cardiovascular disease). High blood pressure (hypertension). Diabetes. High cholesterol. Sickle cell disease. Problems with blood clotting. Being very overweight. Sleeping problems (obstructivesleep apnea). Other risk factors include: Being older than age 32. A history of blood clots, stroke, or mini-stroke (TIA). Race, ethnic background, or a family history of stroke. Smoking or using tobacco products. Taking birth control pills, especially if you smoke. Heavy alcohol and drug use. Not being active. What actions can I take to prevent this? Manage your health conditions High cholesterol. Eat a healthy diet. If this is not enough to manage your cholesterol, you may need to take medicines. Take medicines as told by your doctor. High blood pressure. Try to keep your blood pressure below 130/80. If your blood pressure cannot be managed through a healthy diet and regular exercise, you may need to take medicines. Take medicines as told by your doctor. Ask your doctor if you should check your blood pressure at home. Have your blood pressure checked every year. Diabetes. Eat a healthy diet and get regular exercise. If your blood sugar (glucose) cannot be managed through diet and exercise, you may need to take medicines. Take medicines as told by your doctor. Talk to your doctor about getting checked for sleeping problems. Signs of a problem can include: Snoring a lot. Feeling very tired. Make sure that you manage any other conditions you have. Nutrition  Follow instructions from your doctor about what to eat or  drink. You may be told to: Eat and drink fewer calories each day. Limit how much salt (sodium) you use to 1,500 milligrams (mg) each day. Use only healthy fats for cooking, such as olive oil, canola oil, and sunflower oil. Eat healthy foods. To do this: Choose foods that are high in fiber. These include whole grains, and fresh fruits and  vegetables. Eat at least 5 servings of fruits and vegetables a day. Try to fill one-half of your plate with fruits and vegetables at each meal. Choose low-fat (lean) proteins. These include low-fat cuts of meat, chicken without skin, fish, tofu, beans, and nuts. Eat low-fat dairy products. Avoid foods that: Are high in salt. Have saturated fat. Have trans fat. Have cholesterol. Are processed or pre-made. Count how many carbohydrates you eat and drink each day. Lifestyle If you drink alcohol: Limit how much you have to: 0-1 drink a day for women who are not pregnant. 0-2 drinks a day for men. Know how much alcohol is in your drink. In the [place] one drink equals one 12 oz bottle of beer (332m), one 5 oz glass of wine (1439m, or one 1 oz glass of hard liquor (4476m Do not smoke or use any products that have nicotine or tobacco. If you need help quitting, ask your doctor. Avoid secondhand smoke. Do not use drugs. Activity  Try to stay at a healthy weight. Get at least 30 minutes of exercise on most days, such as: Fast walking. Biking. Swimming. Medicines Take over-the-counter and prescription medicines only as told by your doctor. Avoid taking birth control pills. Talk to your doctor about the risks of taking birth control pills if: You are over 35 61ars old. You smoke. You get very bad headaches. You have had a blood clot. Where to find more information American Stroke Association: www.strokeassociation.org Get help right away if: You or a loved one has any signs of a stroke. "BE FAST" is an easy way to remember the warning signs: B - Balance. Dizziness, sudden trouble walking, or loss of balance. E - Eyes. Trouble seeing or a change in how you see. F - Face. Sudden weakness or loss of feeling of the face. The face or eyelid may droop on one side. A - Arms. Weakness or loss of feeling in an arm. This happens all of a sudden and most often on one side of the body. S -  Speech. Sudden trouble speaking, slurred speech, or trouble understanding what people say. T - Time. Time to call emergency services. Write down what time symptoms started. You or a loved one has other signs of a stroke, such as: A sudden, very bad headache with no known cause. Feeling like you may vomit (nausea). Vomiting. A seizure. These symptoms may be an emergency. Get help right away. Call your local emergency services (911 in the [place] Do not wait to see if the symptoms will go away. Do not drive yourself to the hospital. Summary You can help to prevent a stroke by eating healthy, exercising, and not smoking. It also helps to manage any health problems you have. Do not smoke or use any products that contain nicotine or tobacco. Get help right away if you or a loved one has any signs of a stroke. This information is not intended to replace advice given to you by your health care provider. Make sure you discuss any questions you have with your health care provider. Document Revised: 01/27/2020 Document Reviewed: 01/27/2020 Elsevier Patient Education  2023 [employer]  

## 2022-05-05 NOTE — Progress Notes (Signed)
[facility] [address] [place]. [place] [address] [phone]       HOSPITAL FOLLOW UP NOTE  [name] Date of Birth:  1956/04/02 Medical Record Number:  [id number]   Reason for Referral:  hospital stroke follow up    SUBJECTIVE:   CHIEF COMPLAINT:  Chief Complaint  Patient presents with   Hospitalization Follow-up stroke    Pt reports she stays exhausted and that her left arm is always numb and has no feeling in it. Room 2 alone    HPI:   Brandy Love is a 66 y.o. female with a history of atrial fibrillation on anticoagulation who presented on 04/13/2022 with left-sided numbness and several episodes of weakness.  Evaluated by Dr. 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  CTA head/neck unremarkable.  EF 55 to 60%.  LDL 119.  A1c 5.6.  Resumed home dose Xarelto and added aspirin 81 mg daily.  Also added atorvastatin 80 mg daily.  PT/OT no therapy needs.  She returned to ED on 10/7 with worsening left-sided numbness with MRI negative for new infarct or extension of prior infarct.  Urine concerning for UTI and placed on antibiotics at discharge.  Symptoms improved prior to discharge.   Today, 05/05/2022, patient is being seen for initial hospital follow-up.  She reports continued left arm numbness with occasional cold sensation and some hand weakness but this has been gradually improving.  Occasional left facial numbness.  Denies any symptoms in leg or side.  She is interested in working with therapies.  Denies any new stroke/TIA symptoms.  Compliant on Xarelto and aspirin as well as atorvastatin.  Blood pressure well controlled.  Routinely follows with PCP Dr. Kenton Love.         PERTINENT IMAGING  Per hospitalization 04/13/2022 -04/15/2022 CT head No acute abnormality.  CTA head & neck show no evidence of new acute intracranial pathology. MRI brain show 6 mm nonhemorrhagic acute to subacute infarct of the right  thalamus 2D Echo EF 55-60%, aortic valve sclerosis/calcification is present LDL 119 HgbA1c 5.6  MRI 04/16/2022 acute nonhemorrhagic infarct in the right thalamus likely representing expected evolution of prior infarct rather than any significant progression, remote lacunar infarct in the left thalamus, scattered white matter disease         ROS:   14 system review of systems performed and negative with exception of those listed in HPI  PMH:  Past Medical History:  Diagnosis Date   A-fib ([place])    Asthma    controlled well   Bladder prolapse, female, acquired    Edema extremities    lower legs at times   Gastric ulcer    GERD (gastroesophageal reflux disease)    Glaucoma    High blood pressure    OAB (overactive bladder)    Pre-diabetes    Prediabetes     PSH:  Past Surgical History:  Procedure Laterality Date   ABDOMINAL HYSTERECTOMY  1993   ANTERIOR AND POSTERIOR REPAIR N/A 06/04/2019   Procedure: CYSTOSCOPY ANTERIOR (CYSTOCELE);  Surgeon: Brandy Loser, MD;  Location: [facility];  Service: Urology;  Laterality: N/A;   APPENDECTOMY     CATARACT EXTRACTION  2016   CYSTECTOMY     1994 and 1982   INTERSTIM IMPLANT PLACEMENT  04/10/2012   Procedure: Barrie Lyme IMPLANT FIRST STAGE;  Surgeon: Brandy Packer, MD;  Location: [facility];  Service: Urology;  Laterality: N/A;  rad tech ok  per vicki at [facility]    INTERSTIM IMPLANT PLACEMENT  04/10/2012   Procedure: Barrie Lyme IMPLANT SECOND STAGE;  Surgeon: Brandy Packer, MD;  Location: [facility];  Service: Urology;  Laterality: N/A;   LOWER LEG SOFT TISSUE TUMOR EXCISION  1994   cyst left ankle -involved muscle removed-limited mobility now   NASAL SEPTUM SURGERY     PUBOVAGINAL SLING N/A 06/04/2019   Procedure: Brandy Love;  Surgeon: Brandy Loser, MD;  Location: [facility];  Service: Urology;  Laterality: N/A;    Social History:  Social History   Socioeconomic History    Marital status: Married    Spouse name: Brandy Love   Number of children: 3   Years of education: Not on file   Highest education level: Not on file  Occupational History   Occupation: Building services engineer    Occupation: Retired from [employer] Use   Smoking status: Never   Smokeless tobacco: Never  Scientific laboratory technician Use: Never used  Substance and Sexual Activity   Alcohol use: No   Drug use: No   Sexual activity: Not on file  Other Topics Concern   Not on file  Social History Narrative   Not on file   Social Determinants of Health   Financial Resource Strain: Not on file  Food Insecurity: Not on file  Transportation Needs: Not on file  Physical Activity: Not on file  Stress: Not on file  Social Connections: Not on file  Intimate Partner Violence: Not on file    Family History:  Family History  Problem Relation Age of Onset   Asthma Mother    Diabetes Mother    Hernia Mother    Obesity Mother    Heart disease Father    Prostate cancer Father     Medications:   Current Outpatient Medications on File Prior to Visit  Medication Sig Dispense Refill   albuterol (PROVENTIL HFA;VENTOLIN HFA) 108 (90 Base) MCG/ACT inhaler Inhale 2 puffs into the lungs every 6 (six) hours as needed for wheezing or shortness of breath.     aspirin EC 81 MG tablet Take 1 tablet (81 mg total) by mouth daily. Swallow whole. 30 tablet 0   atorvastatin (LIPITOR) 80 MG tablet Take 1 tablet (80 mg total) by mouth daily. 30 tablet 0   benzonatate (TESSALON) 200 MG capsule Take 200 mg by mouth 3 (three) times daily.     clotrimazole-betamethasone (LOTRISONE) cream Apply 1 Application topically 2 (two) times daily as needed (for irritation).     dronedarone (MULTAQ) 400 MG tablet Take 1 tablet (400 mg total) by mouth 2 (two) times daily with a meal. 180 tablet 1   fluticasone (FLOVENT HFA) 110 MCG/ACT inhaler Inhale 2 puffs into the lungs 2 (two) times daily as needed (for seasonal  flares/asthma).     hydrochlorothiazide (HYDRODIURIL) 25 MG tablet Take 25 mg by mouth every morning.     hyoscyamine (LEVSIN SL) 0.125 MG SL tablet Place 0.125 mg under the tongue 2 (two) times daily as needed for cramping.     losartan (COZAAR) 100 MG tablet Take 1 tablet (100 mg total) by mouth daily. Restart from 04/22/22     omeprazole (PRILOSEC) 40 MG capsule Take 40 mg by mouth daily before breakfast.     rivaroxaban (XARELTO) 20 MG TABS tablet Take 1 tablet (20 mg total) by mouth daily with supper. 90 tablet 1   sertraline (ZOLOFT) 100 MG tablet Take 200 mg by  mouth daily.     sucralfate (CARAFATE) 1 g tablet Take 1 g by mouth daily as needed (to coat the stomach).     triamcinolone (KENALOG) 0.1 % paste 1 Application 2 (two) times daily as needed (for fever blisters- apply to the lips).     TYLENOL 500 MG tablet Take 500-1,000 mg by mouth every 6 (six) hours as needed for mild pain or headache.     metoprolol tartrate (LOPRESSOR) 25 MG tablet Take 0.5 tablets (12.5 mg total) by mouth 2 (two) times daily. 90 tablet 3   No current facility-administered medications on file prior to visit.    Allergies:   Allergies  Allergen Reactions   Shellfish Allergy Anaphylaxis, Nausea Only, Swelling and Other (See Comments)    Swelling of the throat   Erythromycin Hives   Ibuprofen Other (See Comments)    Vertigo   Sulfa Antibiotics Hives   Adhesive [Tape] Rash and Other (See Comments)    Rash with transderm   Amlodipine Hives, Swelling and Rash   Betadine [Povidone Iodine] Rash and Other (See Comments)    ALL TOPICAL IODINES   Povidone-Iodine Rash and Other (See Comments)    ALL TOPICAL IODINES   Silicone Rash and Other (See Comments)    Rash with transderm      OBJECTIVE:  Physical Exam  Vitals:   05/05/22 1115  BP: (!) 107/48  Pulse: (!) 49  Weight: 228 lb (103.4 kg)  Height: '5\' 7"'$  (1.702 m)   Body mass index is 35.71 kg/m. No results found.  General: well developed,  well nourished, pleasant middle-age Caucasian female, seated, in no evident distress Head: head normocephalic and atraumatic.   Neck: supple with no carotid or supraclavicular bruits Cardiovascular: regular rate and rhythm, no murmurs Musculoskeletal: no deformity Skin:  no rash/petichiae Vascular:  Normal pulses all extremities   Neurologic Exam Mental Status: Awake and fully alert.  Fluent speech and language.  Oriented to place and time. Recent and remote memory intact. Attention span, concentration and fund of knowledge appropriate. Mood and affect appropriate.  Cranial Nerves: Fundoscopic exam reveals sharp disc margins. Pupils equal, briskly reactive to light. Extraocular movements full without nystagmus. Visual fields full to confrontation. Hearing intact. Facial sensation intact. Face, tongue, palate moves normally and symmetrically.  Motor: Normal bulk and tone. Normal strength in all tested extremity muscles except slight decrease left hand dexterity Sensory.: intact to touch , pinprick , position and vibratory sensation except odd sensation over left arm distally Coordination: Rapid alternating movements normal in all extremities except slightly decreased left hand dexterity. Finger-to-nose and heel-to-shin performed accurately bilaterally. Gait and Station: Arises from chair without difficulty. Stance is normal. Gait demonstrates normal stride length and balance without use of AD. Reflexes: 1+ and symmetric. Toes downgoing.     NIHSS  1 Modified Rankin  2      ASSESSMENT: Brandy Love is a 66 y.o. 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 Vascular risk factors include HTN, HLD, A-fib on Xarelto, advanced age and headache history.      PLAN:  Right thalamic stroke:  Residual deficit: LUE weakness with paresthesias.  Referral placed to neuro rehab OT. Discussed potential benefit with compression glove. Currently,  denies pain - advised to call if she starts to experience painful sensation that interferes with daily activity or sleep Continue aspirin '81mg'$  daily and Xarelto  and atorvastatin (Lipitor) for secondary stroke prevention and history of  A-fib.   Discussed secondary stroke prevention measures and importance of close PCP follow up for aggressive stroke risk factor management including BP goal<130/90, HLD with LDL goal<70 and DM with A1c.<7 .  Stroke labs 04/2022: LDL 119, A1c 5.6 I have gone over the pathophysiology of stroke, warning signs and symptoms, risk factors and their management in some detail with instructions to go to the closest emergency room for symptoms of concern.   Overall stable from stroke standpoint without further recommendations and risk factors are managed by PCP. She may follow up PRN, as usual for our patients who are strictly being followed for stroke. If any new neurological issues should arise, request PCP place referral for evaluation by one of our neurologists. Thank you.     CC:  [facility] provider: Dr. Leonie Man PCP: Shirline Frees, MD    I spent 57 minutes of face-to-face and non-face-to-face time with patient.  This included previsit chart review including review of recent hospitalization, lab review, study review, order entry, electronic health record documentation, patient education regarding recent stroke including etiology, secondary stroke prevention measures and importance of managing stroke risk factors, residual deficits and typical recovery time and answered all other questions to patient satisfaction   Frann Rider, AGNP-BC  [facility] [address] [place], [place] [address]  Phone [phone] Fax [phone] Note: This document was prepared with digital dictation and possible smart phrase technology. Any transcriptional errors that result from this process are unintentional.

## 2022-05-10 ENCOUNTER — Emergency Department
Admission: EM | Admit: 2022-05-10 | Discharge: 2022-05-10 | Disposition: A | Discharge status: Home / Self Care | Payer: Medicare Other | Attending: Emergency Medicine | Admitting: Emergency Medicine

## 2022-05-10 ENCOUNTER — Inpatient Hospital Stay: Payer: Self-pay | Admitting: Adult Health

## 2022-05-10 ENCOUNTER — Ambulatory Visit (HOSPITAL_COMMUNITY): Payer: Medicare Other | Admitting: Nurse Practitioner

## 2022-05-10 ENCOUNTER — Emergency Department: Payer: Medicare Other

## 2022-05-10 ENCOUNTER — Other Ambulatory Visit: Payer: Self-pay

## 2022-05-10 DIAGNOSIS — Z7901 Long term (current) use of anticoagulants: Secondary | ICD-10-CM | POA: Diagnosis not present

## 2022-05-10 DIAGNOSIS — I4891 Unspecified atrial fibrillation: Secondary | ICD-10-CM | POA: Insufficient documentation

## 2022-05-10 DIAGNOSIS — M79604 Pain in right leg: Secondary | ICD-10-CM | POA: Diagnosis not present

## 2022-05-10 DIAGNOSIS — I10 Essential (primary) hypertension: Secondary | ICD-10-CM | POA: Diagnosis not present

## 2022-05-10 MED ORDER — TRAMADOL HCL 50 MG PO TABS: 50.0000 mg | ORAL_TABLET | Freq: Four times a day (QID) | ORAL | 0 refills | Status: DC | PRN

## 2022-05-10 NOTE — ED Triage Notes (Signed)
Pt to Ed via [facility] from home. Pt reports she fell on the 13th and has been having right leg pain since. Pt reports right leg pain is worse and felt like something popped behind her right knee.

## 2022-05-10 NOTE — ED Provider Notes (Signed)
   [facility] Provider Note    Event Date/Time   First MD Initiated Contact with Patient 05/10/22 1109     (approximate)  History   Chief Complaint: Leg Pain (Right leg)  HPI  Brandy Love is a 66 y.o. female with a past medical history of atrial fibrillation on Xarelto, gastric reflux, hypertension, presents to the emergency department for right leg pain.  According to the patient 2 weeks ago she tripped over her dog landing on her right leg.  Patient states pain behind the right knee which has been fairly constant however since this morning she felt a pop in the leg and has had worsening pain especially around the calf.  Physical Exam   Triage Vital Signs: ED Triage Vitals [05/10/22 1037]  Enc Vitals Group     BP (!) 121/52     Pulse Rate (!) 46     Resp 16     Temp 98.1 F (36.7 C)     Temp Source Oral     SpO2 100 %     Weight 227 lb 1.2 oz (103 kg)     Height '5\' 7"'$  (1.702 m)     Head Circumference      Peak Flow      Pain Score 10     Pain Loc      Pain Edu?      Excl. in [place]?     Most recent vital signs: Vitals:   05/10/22 1037  BP: (!) 121/52  Pulse: (!) 46  Resp: 16  Temp: 98.1 F (36.7 C)  SpO2: 100%    General: Awake, no distress.  CV:  Good peripheral perfusion.  Regular rate and rhythm  Resp:  Normal effort.  Equal breath sounds bilaterally.  Abd:  No distention.  Soft, nontender.  No rebound or guarding. Other:  Warm lower extremity no calf tenderness.  Patient has no significant edema.  Patient has good range of motion in the knee.   ED Results / Procedures / Treatments   RADIOLOGY  I have reviewed and interpreted the knee x-ray images I do not see any obvious fracture on my evaluation. Radiology has read a small suprapatellar joint effusion with arthritis of the knee joint.   MEDICATIONS ORDERED IN ED: Medications - No data to display   IMPRESSION / MDM / [place] / ED COURSE  I reviewed the triage  vital signs and the nursing notes.  Patient's presentation is most consistent with acute presentation with potential threat to life or bodily function.  Patient presents emergency department for continued right leg pain.  Patient states she has not been very ambulatory since her fall 2 weeks ago due to worsening pain in the right leg.  Patient now describes pain mostly in the right calf that has worsened since this morning.  We will obtain an ultrasound as a precaution.  Reassuringly patient's knee x-ray shows a small suprapatellar joint effusion but otherwise no significant or acute finding.  Patient's leg appears well, good signs of circulation, no edema and good range of motion in the knee joint.   Ultrasound is negative.  We will discharge patient home with orthopedic follow-up.  Patient agreeable to plan of care.  FINAL CLINICAL IMPRESSION(S) / ED DIAGNOSES   Right leg pain    Note:  This document was prepared using Dragon voice recognition software and may include unintentional dictation errors.   Harvest Dark, MD 05/10/22 1256

## 2022-05-10 NOTE — ED Provider Triage Note (Signed)
Emergency Medicine Provider Triage Evaluation Note  YSABELLE GOODROE , a 66 y.o. female  was evaluated in triage.  Pt complains of right posterior knee pain. She fell a couple of weeks ago and had some knee pain, but it became acutely worse today. Unable to bear weight.   Physical Exam  BP (!) 121/52   Pulse (!) 46   Temp 98.1 F (36.7 C) (Oral)   Resp 16   Ht '5\' 7"'$  (1.702 m)   Wt 103 kg   SpO2 100%   BMI 35.56 kg/m  Gen:   Awake, no distress   Resp:  Normal effort  MSK:   Moves extremities without difficulty  Other:    Medical Decision Making  Medically screening exam initiated at 10:38 AM.  Appropriate orders placed.  DONTA MCINROY was informed that the remainder of the evaluation will be completed by another provider, this initial triage assessment does not replace that evaluation, and the importance of remaining in the ED until their evaluation is complete.   Victorino Dike, [place] 05/10/22 1039

## 2022-05-10 NOTE — ED Triage Notes (Signed)
Ems report - pt walking and felt pop behind right knee. Now 10/10 pain.

## 2022-05-13 ENCOUNTER — Ambulatory Visit: Payer: Medicare Other | Admitting: Physical Therapy

## 2022-05-25 NOTE — Therapy (Signed)
OUTPATIENT OCCUPATIONAL THERAPY NEURO EVALUATION  Patient Name: Brandy Love MRN: [id number] DOB:[birth year], 66 y.o., female Today's Date: 05/27/2022  PCP: Dr. Kenton Kingfisher REFERRING PROVIDER: Frann Rider, NP   OT End of Session - 05/27/22 0901     Visit Number 1    Number of Visits 25    Date for OT Re-Evaluation 08/19/22    Authorization Type [employer] Medicare    Authorization Time Period 12 weeks- POC written for 12 to account for scheduling    Authorization - Visit Number 1    Authorization - Number of Visits 10    Progress Note Due on Visit 10    OT Start Time 0756    OT Stop Time 0840    OT Time Calculation (min) 44 min    Activity Tolerance Patient tolerated treatment well    Behavior During Therapy WFL for tasks assessed/performed             Past Medical History:  Diagnosis Date   A-fib ([place])    Asthma    controlled well   Bladder prolapse, female, acquired    Edema extremities    lower legs at times   Gastric ulcer    GERD (gastroesophageal reflux disease)    Glaucoma    High blood pressure    OAB (overactive bladder)    Pre-diabetes    Prediabetes    Past Surgical History:  Procedure Laterality Date   ABDOMINAL HYSTERECTOMY  1993   ANTERIOR AND POSTERIOR REPAIR N/A 06/04/2019   Procedure: CYSTOSCOPY ANTERIOR (CYSTOCELE);  Surgeon: Bjorn Loser, MD;  Location: [facility];  Service: Urology;  Laterality: N/A;   APPENDECTOMY     CATARACT EXTRACTION  2016   CYSTECTOMY     1994 and 1982   INTERSTIM IMPLANT PLACEMENT  04/10/2012   Procedure: Barrie Lyme IMPLANT FIRST STAGE;  Surgeon: [name], MD;  Location: [facility];  Service: Urology;  Laterality: N/A;  rad tech ok per vicki at [facility]    INTERSTIM IMPLANT PLACEMENT  04/10/2012   Procedure: Barrie Lyme IMPLANT SECOND STAGE;  Surgeon: [name], MD;  Location: [facility];  Service: Urology;  Laterality: N/A;   LOWER LEG SOFT TISSUE TUMOR EXCISION  1994   cyst  left ankle -involved muscle removed-limited mobility now   NASAL SEPTUM SURGERY     PUBOVAGINAL SLING N/A 06/04/2019   Procedure: Gaynelle Arabian;  Surgeon: Bjorn Loser, MD;  Location: [facility];  Service: Urology;  Laterality: N/A;   Patient Active Problem List   Diagnosis Date Noted   Stroke ([place]) 04/15/2022   Acute CVA (cerebrovascular accident) ([place]) 04/14/2022   Paroxysmal atrial fibrillation ([place]) 04/04/2022   Cystocele with prolapse 06/04/2019   Other fatigue 10/17/2017   Shortness of breath on exertion 10/17/2017   Essential hypertension 10/17/2017   Prediabetes 10/17/2017   CHRONIC RHINITIS 11/17/2008   COUGH 08/13/2008   Unspecified glaucoma 07/18/2008   Asthma 07/18/2008   GERD 07/18/2008    ONSET DATE: 04/13/22  REFERRING DIAG:  Diagnosis  R29.898 (ICD-10-CM) - Left hand weakness  M79.609,R20.2 (ICD-10-CM) - Paresthesia and pain of left extremity  I63.81 (ICD-10-CM) - Right thalamic infarction ([place])    THERAPY DIAG:  Other lack of coordination - Plan: Ot plan of care cert/re-cert  Other disturbances of skin sensation - Plan: Ot plan of care cert/re-cert  Muscle weakness (generalized) - Plan: Ot plan of care cert/re-cert  Other abnormalities of gait and mobility - Plan: Ot plan of care cert/re-cert  Pain in left arm - Plan: Ot plan of care cert/re-cert  Pain in left hand - Plan: Ot plan of care cert/re-cert  Rationale for Evaluation and Treatment: Rehabilitation  SUBJECTIVE:   SUBJECTIVE STATEMENT: Pt reports a fall and injuring LLE Pt accompanied by: self  PERTINENT HISTORY: Brandy Love is a 66 year old female who presented to ED 04/13/22 with LUE and left facial weakness/numbness.  PMHx atrial fibrillation, glaucoma, asthma, GERD, hypertension, bladder prolapse recent fall with LLE injury, hx of old R shoulder injury. MR brain showed a 6 mm nonhemorrhagic acute/subacute infarct of the right thalamus. Pt with remote lacunar infarct of left  thalamus, and scatter white matter disease.  PRECAUTIONS: Fall, L hand numbness, PWB LLE due to recent fall(sees MD on Dec 6)  WEIGHT BEARING RESTRICTIONS: Yes recent fall,using walker to reduce weightbearing  PAIN:  Are you having pain? Yes: NPRS scale: 2/10 Pain location: LUE Pain description: numbness, tingling Aggravating factors: use Relieving factors: tylenol  FALLS: Has patient fallen in last 6 months? Yes. Number of falls 1  LIVING ENVIRONMENT: Lives with: lives with their family Lives in: House/apartment Stairs: Yes: External: 3 steps; on right going up and on left going up Has following equipment at home:  walker  PLOF: Independent  PATIENT GOALS: regain use of LUE  OBJECTIVE:   HAND DOMINANCE: Right  ADLs: Overall ADLs: mod I Transfers/ambulation related to ADLs: Eating: mod I Grooming: mod I UB Dressing: mod I, difficulty with buttons  LB Dressing: mod I  Toileting: mod I Bathing: mod I Tub Shower transfers: mod I walk in shower, has shower seat Equipment: Walk in shower  IADLs: Shopping: online Light housekeeping: dependent Meal Prep: dependent Community mobility: mod I with walker Medication management: mod I Financial management:  m od I Handwriting: 100% legible Typing: 23 WPM, with 70% accuracy= 16 WPM net speed  MOBILITY STATUS: Hx of falls and mod I-supervision with hx of falls    ACTIVITY TOLERANCE: Activity tolerance: 5 mins standing prior to rest   UPPER EXTREMITY ROM: RUE WFLS despite pt report of old injury,  remaining AROM WFLS  Active ROM Right eval Left eval  Shoulder flexion  110  Shoulder abduction  100  Shoulder adduction    Shoulder extension    Shoulder internal rotation    Shoulder external rotation    Elbow flexion    Elbow extension    Wrist flexion    Wrist extension    Wrist ulnar deviation    Wrist radial deviation    Wrist pronation    Wrist supination    (Blank rows = not tested)  UPPER EXTREMITY  MMT:   NT   HAND FUNCTION: Grip strength: Right: 53.7 lbs; Left: 36.1 lbs  COORDINATION: 9 Hole Peg test: Right: 22.72 sec; Left: 27.31 sec Box and Blocks:  Right 51blocks, Left 43 blocks  SENSATION:LUE Light touch: Impaired  Hot/Cold: Impaired  Proprioception: Impaired   EDEMA: n/a   COGNITION: Overall cognitive status: Impaired, slower processing    VISION ASSESSMENT: Not tested denies visual changes        TODAY'S TREATMENT:  DATE: n/a   PATIENT EDUCATION: Education details: role of OT, potential OT goals Person educated: Patient Education method: Explanation Education comprehension: verbalized understanding  HOME EXERCISE PROGRAM: N/a   GOALS: Goals reviewed with patient? Yes potential goals discussed  SHORT TERM GOALS: Target date: 06/25/22  I with initial HEP for coordination Baseline: Goal status: INITIAL  2.  I with sensory precautions for LUE and pain reduction strategies prn. Baseline:  Goal status: INITIAL  3.  Pt will demonstrate improved  Baseline:  Goal status: INITIAL  4.  Pt will resume performance of light home management activities at supervision level Baseline:  Goal status: INITIAL  5.  Pt will perform basic cooking with supervision demonstrating good safety awareness. Baseline:  Goal status: INITIAL  6 Pt will demonstrate improved UE functional use coordination as evidenced by increasing   box/ blocks for LUE to 47 blocks or greater.  Baseline:Box and Blocks:  Right 51blocks, Left 43 blocks Goal status: intial   LONG TERM GOALS: Target date: 08/19/22   Pt will complete FOTO at d/c. Baseline:  Goal status: INITIAL  2.  I with HEP for proximal strengthening prn. Baseline:  Goal status: INITIAL  3.  Pt will increase net typing speed to 25 wpm Baseline: Typing: 23 WPM, with 70% accuracy= 16 WPM  net speed Goal status: INITIAL  4.  Pt will perform basic home management mod I demonstrating good safety awareness. Baseline:  Goal status: INITIAL  5.  Pt will perform basic cooking mod I demonstrating good safety awareness. Baseline:  Goal status: INITIAL    ASSESSMENT:  CLINICAL IMPRESSION: Patient is a 66 y.o. female  who was seen today for occupational therapy evaluation for acute/subacute infarct of the right thalamus. Pt with remote lacunar infarct of left thalamus, and scatter white matter disease.[name] PMHx atrial fibrillation, glaucoma, asthma, GERD, hypertension, bladder prolapse, recent fall with LLE injury, pt is currently PWB per ortho. 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 PERFORMANCE DEFICITS: in functional skills including ADLs, IADLs, coordination, dexterity, sensation, ROM, strength, pain, Fine motor control, Gross motor control, mobility, balance, endurance, decreased knowledge of precautions, decreased knowledge of use of DME, and UE functional use, cognitive skills including thought, slower processing speed and psychosocial skills including coping strategies, environmental adaptation, habits, interpersonal interactions, and routines and behaviors.   IMPAIRMENTS: are limiting patient from ADLs, IADLs, work, play, leisure, and social participation.   CO-MORBIDITIES: may have co-morbidities  that affects occupational performance. Patient will benefit from skilled OT to address above impairments and improve overall function.  MODIFICATION OR ASSISTANCE TO COMPLETE EVALUATION: No modification of tasks or assist necessary to complete an evaluation.  OT OCCUPATIONAL PROFILE AND HISTORY: Detailed assessment: Review of records and additional review of physical, cognitive, psychosocial history related to current functional performance.  CLINICAL DECISION MAKING: LOW - limited treatment options, no task modification necessary  REHAB POTENTIAL: Good  EVALUATION COMPLEXITY:  Low    PLAN:  OT FREQUENCY: 2x/week  OT DURATION: 12 weeks anticipate d/c after 6-8 weeks, however POC written for 12 weeks to account for holiday scheduling  PLANNED INTERVENTIONS: self care/ADL training, therapeutic exercise, therapeutic activity, neuromuscular re-education, manual therapy, passive range of motion, gait training, balance training, functional mobility training, aquatic therapy, splinting, electrical stimulation, ultrasound, paraffin, fluidotherapy, moist heat, cryotherapy, contrast bath, patient/family education, cognitive remediation/compensation, energy conservation, coping strategies training, DME and/or AE instructions, and Re-evaluation  RECOMMENDED OTHER SERVICES: PT  CONSULTED AND AGREED WITH PLAN OF CARE: Patient  PLAN FOR NEXT  SESSION: coordiantion HEP for LUE sensory precautions   Wilfred Siverson, OT 05/27/2022, 9:25 AM

## 2022-05-27 ENCOUNTER — Ambulatory Visit: Payer: Medicare Other | Admitting: Physical Therapy

## 2022-05-27 ENCOUNTER — Ambulatory Visit: Payer: Medicare Other | Attending: Family Medicine | Admitting: Occupational Therapy

## 2022-05-27 ENCOUNTER — Encounter: Payer: Self-pay | Admitting: Physical Therapy

## 2022-05-27 VITALS — BP 123/71 | HR 53

## 2022-05-27 DIAGNOSIS — M79602 Pain in left arm: Secondary | ICD-10-CM | POA: Diagnosis present

## 2022-05-27 DIAGNOSIS — R2681 Unsteadiness on feet: Secondary | ICD-10-CM | POA: Diagnosis present

## 2022-05-27 DIAGNOSIS — R2689 Other abnormalities of gait and mobility: Secondary | ICD-10-CM

## 2022-05-27 DIAGNOSIS — R208 Other disturbances of skin sensation: Secondary | ICD-10-CM | POA: Insufficient documentation

## 2022-05-27 DIAGNOSIS — M25561 Pain in right knee: Secondary | ICD-10-CM | POA: Diagnosis present

## 2022-05-27 DIAGNOSIS — M6281 Muscle weakness (generalized): Secondary | ICD-10-CM

## 2022-05-27 DIAGNOSIS — R278 Other lack of coordination: Secondary | ICD-10-CM | POA: Diagnosis not present

## 2022-05-27 DIAGNOSIS — M79642 Pain in left hand: Secondary | ICD-10-CM | POA: Diagnosis present

## 2022-05-27 NOTE — Therapy (Signed)
OUTPATIENT PHYSICAL THERAPY NEURO EVALUATION   Patient Name: Brandy Love MRN: [id number] DOB:1956/01/09, 66 y.o., female Today's Date: 05/27/2022   PCP: Shirline Frees, MD  REFERRING PROVIDER:  Shirline Frees, MD  END OF SESSION:  PT End of Session - 05/27/22 0935     Visit Number 1    Number of Visits 17    Date for PT Re-Evaluation 08/25/22    Authorization Type U[employer] Medicare    PT Start Time 0845    PT Stop Time 0927    PT Time Calculation (min) 42 min    Equipment Utilized During Treatment Gait belt    Activity Tolerance Patient tolerated treatment well;Patient limited by pain    Behavior During Therapy WFL for tasks assessed/performed             Past Medical History:  Diagnosis Date   A-fib ([place])    Asthma    controlled well   Bladder prolapse, female, acquired    Edema extremities    lower legs at times   Gastric ulcer    GERD (gastroesophageal reflux disease)    Glaucoma    High blood pressure    OAB (overactive bladder)    Pre-diabetes    Prediabetes    Past Surgical History:  Procedure Laterality Date   ABDOMINAL HYSTERECTOMY  1993   ANTERIOR AND POSTERIOR REPAIR N/A 06/04/2019   Procedure: CYSTOSCOPY ANTERIOR (CYSTOCELE);  Surgeon: Bjorn Loser, MD;  Location: [facility];  Service: Urology;  Laterality: N/A;   APPENDECTOMY     CATARACT EXTRACTION  2016   CYSTECTOMY     1994 and 1982   INTERSTIM IMPLANT PLACEMENT  04/10/2012   Procedure: Barrie Lyme IMPLANT FIRST STAGE;  Surgeon: [name], MD;  Location: [facility];  Service: Urology;  Laterality: N/A;  rad tech ok per vicki at [facility]    INTERSTIM IMPLANT PLACEMENT  04/10/2012   Procedure: Barrie Lyme IMPLANT SECOND STAGE;  Surgeon: [name], MD;  Location: [facility];  Service: Urology;  Laterality: N/A;   LOWER LEG SOFT TISSUE TUMOR EXCISION  1994   cyst left ankle -involved muscle removed-limited mobility now   NASAL SEPTUM SURGERY      PUBOVAGINAL SLING N/A 06/04/2019   Procedure: Gaynelle Arabian;  Surgeon: Bjorn Loser, MD;  Location: [facility];  Service: Urology;  Laterality: N/A;   Patient Active Problem List   Diagnosis Date Noted   Stroke ([place]) 04/15/2022   Acute CVA (cerebrovascular accident) ([place]) 04/14/2022   Paroxysmal atrial fibrillation ([place]) 04/04/2022   Cystocele with prolapse 06/04/2019   Other fatigue 10/17/2017   Shortness of breath on exertion 10/17/2017   Essential hypertension 10/17/2017   Prediabetes 10/17/2017   CHRONIC RHINITIS 11/17/2008   COUGH 08/13/2008   Unspecified glaucoma 07/18/2008   Asthma 07/18/2008   GERD 07/18/2008    ONSET DATE: 04/26/2022  REFERRING DIAG: I67.9 (ICD-10-CM) - Cerebrovascular disease, unspecified   THERAPY DIAG:  Muscle weakness (generalized)  Other abnormalities of gait and mobility  Unsteadiness on feet  Acute pain of right knee  Rationale for Evaluation and Treatment: Rehabilitation  SUBJECTIVE:  SUBJECTIVE STATEMENT: Prior to CVA, pt was not using any AD to help her walk. Was completely independent. Now using RW to help with walking due to R knee pain. When trying to walk with no AD, can walk for about 6 steps and feels more pressure in her R knee. Not driving right now. No numbness in LLE, just in LUE and can't feel with her LUE. Only walking small distances in the house. Had to walk farther coming in today. It didn't hurt, it was just an achy feeling. Tries not to put too much weight on RLE, due to fear of trying to hurt it more. When she follows up with orthopedic doctor, going to get a cortisone injection in R knee.  Pt accompanied by: self  PERTINENT HISTORY: Pt is a 66 y/o female admitted from urgent care on 04/14/22 secondary to LUE numbness. MRI showed R  lateral thalamus infarct. PMH includes glaucoma, HTN, and a fib.Returned to ED on 10/7 with worsening left-sided numbness with MRI negative for new infarct or extension of prior infarct.  Went to ED on 05/10/22 for R knee and calf pain after she heard a pop after a recent fall and pt wasn't unable to weight on her leg, ultrasound was negative for DVT and x-ray showed no acute finding but moderate right knee osteoarthritic changes. Pt discharged with orthopedic follow up.   PAIN:  Are you having pain? No Vitals:   05/27/22 0900  BP: 123/71  Pulse: (!) 53    PRECAUTIONS: Fall  WEIGHT BEARING RESTRICTIONS: No  FALLS: Has patient fallen in last 6 months? Yes. 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    OBJECTIVE:   DIAGNOSTIC FINDINGS:  MRI: 04/16/22 IMPRESSION: 1. Acute nonhemorrhagic infarct of the right thalamus is better defined on today's study. This likely represents expected evolution of the infarct rather than any significant progression 2. Remote lacunar infarct of the left thalamus is stable. 3. Scattered white matter disease is otherwise stable. This likely reflects the sequela of chronic microvascular ischemia.  COGNITION: Overall cognitive status: Within functional limits for tasks assessed   SENSATION: Light touch: Impaired  and LUE starting at upper arm and down to hands. LUE feels ice cold  Hot/Cold: Impaired  and difficult to detect with LUE   Light touch WFL - BLE    COORDINATION: Heel to shin: more difficulty to perform with RLE.    POSTURE: forward head  LOWER EXTREMITY ROM:    Pt with incr pain with R knee extension and knee flexion.    LOWER EXTREMITY MMT:    MMT Right Eval Left Eval  Hip flexion 4/5 4+/5  Hip extension    Hip abduction 5/5 5/5  Hip adduction 5/5 5/5  Hip internal rotation    Hip external rotation    Knee flexion 4+/5 (incr pain) 5/5  Knee extension 4+/5 (incr pain) 5/5  Ankle dorsiflexion 5/5 5/5  Ankle plantarflexion    Ankle inversion    Ankle eversion    (  Blank rows = not tested)  All tested in sitting.   BED MOBILITY:  Has been sleeping in her recliner (has slept in one even before the CVA, has been sleeping in one after husband's hip surgery). Would one day want to get back to sleeping in a bed.   TRANSFERS: Assistive device utilized: None  Sit to stand: SBA Stand to sit: SBA  GAIT: Gait pattern: step through pattern, decreased step length- Left, decreased stance time- Right, decreased stride length, and decreased hip/knee flexion- Right Distance walked: Clinic distances. Assistive device utilized: Environmental consultant - 2 wheeled Level of assistance: SBA Comments: Pt reporting incr R knee pain after ambulating small distances in the clinic. Pt ambulated into clinic with standard RW (having to pick it up and place it back down). Educated on using 2 wheeled RW for improved safety (pt has one at home), used it in session today with pt feeling much better with this.   FUNCTIONAL TESTS:  5 times sit to stand: 21.88 seconds with BUE support, pt reporting 4/10 knee pain afterwards  Timed up and go (TUG): 22.31 seconds with RW 10 meter walk test: 27.4 seconds with RW = 1.20 ft/sec     [facility] PT Assessment - 05/27/22 0918       Standardized Balance Assessment   Standardized Balance Assessment Berg Balance Test      Berg Balance Test   Sit to Stand Able to stand  independently using hands    Standing Unsupported  Able to stand 30 seconds unsupported   Pt able to stand for 1 minute 40 seconds before needing to sit due to knee pain   Sitting with Back Unsupported but Feet Supported on Floor or Stool Able to sit safely and securely 2 minutes    Stand to Sit Controls descent by using hands    Standing Unsupported with Eyes Closed Able to stand 10 seconds safely    Standing Unsupported with Feet Together Able to place feet together independently and stand 1 minute safely            Will finish at next session.   TODAY'S TREATMENT:                                                                                                                              N/A during eval.    PATIENT EDUCATION: Education details: Clinical findings, POC, Using 2 wheeled RW instead of standard RW for safety with gait.  Person educated: Patient Education method: Customer service manager Education comprehension: verbalized understanding  HOME EXERCISE PROGRAM: Will provide at next session.   GOALS: Goals reviewed with patient? Yes  SHORT TERM GOALS: Target date: 06/24/2022  Pt will be independent with initial HEP in order to build upon functional gains made in therapy.  Baseline: Goal status: INITIAL  2.  BERG to be completed with STG/LTG written. Baseline:  Goal status: INITIAL  3.  Pt will improve gait speed with  RW to at least 2.0 ft/sec in order to demo improved community mobility/decr fall risk.   Baseline: 27.4 seconds with RW = 1.20 ft/sec  Goal status: INITIAL  4.  Pt will improve TUG time to 19 seconds or less with RW vs. LRAD in order to demo decrease fall risk. Baseline: 22.31 seconds with RW Goal status: INITIAL  5.  Pt will improve 5x sit<>stand to less than or equal to 18 sec to demonstrate improved functional strength and transfer efficiency.  Baseline: 21.88 seconds with BUE support, pt reporting 4/10 knee pain afterwards  Goal status: INITIAL    LONG TERM GOALS: Target date:  07/22/2022  Pt will be independent with final HEP in order to build upon functional gains made in therapy.  Baseline:  Goal status: INITIAL  2.  BERG goal to be written as appropriate. Baseline:  Goal status: INITIAL  3.  Pt will improve gait speed with RW  vs. LRAD to at least 2.6 ft/sec in order to demo improved community mobility/decr fall risk.   Baseline:  27.4 seconds with RW = 1.20 ft/sec  Goal status: INITIAL  4.  Pt will improve TUG time to 15 seconds or less with LRAD vs. No AD in order to demo decrease fall risk. Baseline: 22.31 seconds with RW Goal status: INITIAL  5.  Pt will ambulate at least 345' with LRAD vs. No AD with supervision in order to demo improved household mobility.  Baseline: currently needing to use RW  Goal status: INITIAL  6.  Pt will improve 5x sit<>stand to less than or equal to 15 sec to demonstrate improved functional strength and transfer efficiency.  Baseline: 21.88 seconds with BUE support, pt reporting 4/10 knee pain afterwards  Goal status: INITIAL  ASSESSMENT:  CLINICAL IMPRESSION: Patient is a 66 year old female referred to Neuro OPPT for R lateral thalamus CVA. After being home from hospital, pt had a fall and later on heard a pop in her R knee/calf and was unable to bear weight through it. Pt went to ED and X-ray was negative for any acute findings  but moderate right knee osteoarthritic changes and ultrasound was negative for DVT. Pt currently following up with an orthopedist. Due to R knee pain, pt is currently using a RW for ambulation. Prior to CVA/R knee pain, pt did not need an AD.    Pt's PMH is significant for: glaucoma, HTN, and a fib. The following deficits were present during the exam: R knee pain, decr activity tolerance, impaired balance, gait abnormalities, decr strength, impaired LUE sensation. Based on 5x sit <> stand, TUG, and gait speed with RW pt is an incr risk for falls. Pt would benefit from skilled PT to address these  impairments and functional limitations to maximize functional mobility independence and decr fall risk.    OBJECTIVE IMPAIRMENTS: Abnormal gait, decreased activity tolerance, decreased balance, decreased coordination, decreased endurance, decreased knowledge of use of DME, decreased mobility, difficulty walking, decreased strength, increased fascial restrictions, impaired sensation, impaired UE functional use, and pain.   ACTIVITY LIMITATIONS: carrying, bending, standing, squatting, stairs, transfers, bed mobility, and locomotion level  PARTICIPATION LIMITATIONS: driving, shopping, and community activity  PERSONAL FACTORS: Age, Behavior pattern, Past/current experiences, Time since onset of injury/illness/exacerbation, and 3+ comorbidities: glaucoma, HTN, and a fib, R knee pain are also affecting patient's functional outcome.   REHAB POTENTIAL: Good  CLINICAL DECISION MAKING: Stable/uncomplicated  EVALUATION COMPLEXITY: Low  PLAN:  PT FREQUENCY: 2x/week  PT DURATION: 12 weeks  PLANNED INTERVENTIONS: Therapeutic exercises, Therapeutic activity, Neuromuscular re-education, Balance training, Gait training, Patient/Family education, Self Care, Joint mobilization, Stair training, Vestibular training, DME instructions, Aquatic Therapy, Manual therapy, and Re-evaluation  PLAN FOR NEXT SESSION: Finish BERG and write STG/LTG. Initial HEP for balance as tolerated, R knee strengthening/stretching.    Arliss Journey, PT, DPT  05/27/2022, 9:37 AM

## 2022-06-01 ENCOUNTER — Encounter: Payer: Self-pay | Admitting: Occupational Therapy

## 2022-06-01 ENCOUNTER — Ambulatory Visit: Payer: Medicare Other

## 2022-06-06 ENCOUNTER — Ambulatory Visit: Payer: Medicare Other | Admitting: Physical Therapy

## 2022-06-06 ENCOUNTER — Encounter: Payer: Self-pay | Admitting: Physical Therapy

## 2022-06-06 DIAGNOSIS — R278 Other lack of coordination: Secondary | ICD-10-CM | POA: Diagnosis not present

## 2022-06-06 DIAGNOSIS — R2689 Other abnormalities of gait and mobility: Secondary | ICD-10-CM

## 2022-06-06 DIAGNOSIS — M6281 Muscle weakness (generalized): Secondary | ICD-10-CM

## 2022-06-06 DIAGNOSIS — M25561 Pain in right knee: Secondary | ICD-10-CM

## 2022-06-06 DIAGNOSIS — R2681 Unsteadiness on feet: Secondary | ICD-10-CM

## 2022-06-06 NOTE — Therapy (Signed)
OUTPATIENT PHYSICAL THERAPY NEURO EVALUATION   Patient Name: Brandy Love MRN: [id number] DOB:January 26, 1956, 66 y.o., female Today's Date: 06/06/2022   PCP: Shirline Frees, MD  REFERRING PROVIDER:  Shirline Frees, MD  PT End of Session - 06/06/22 1243     Visit Number 2    Number of Visits 17    Date for PT Re-Evaluation 08/25/22    Authorization Type [employer] Medicare    PT Start Time 1231    PT Stop Time 1311    PT Time Calculation (min) 40 min    Activity Tolerance Patient tolerated treatment well;Patient limited by pain    Behavior During Therapy WFL for tasks assessed/performed                   Past Medical History:  Diagnosis Date   A-fib ([place])    Asthma    controlled well   Bladder prolapse, female, acquired    Edema extremities    lower legs at times   Gastric ulcer    GERD (gastroesophageal reflux disease)    Glaucoma    High blood pressure    OAB (overactive bladder)    Pre-diabetes    Prediabetes    Past Surgical History:  Procedure Laterality Date   ABDOMINAL HYSTERECTOMY  1993   ANTERIOR AND POSTERIOR REPAIR N/A 06/04/2019   Procedure: CYSTOSCOPY ANTERIOR (CYSTOCELE);  Surgeon: Bjorn Loser, MD;  Location: [facility];  Service: Urology;  Laterality: N/A;   APPENDECTOMY     CATARACT EXTRACTION  2016   CYSTECTOMY     1994 and 1982   INTERSTIM IMPLANT PLACEMENT  04/10/2012   Procedure: Barrie Lyme IMPLANT FIRST STAGE;  Surgeon: [name], MD;  Location:  [facility];  Service: Urology;  Laterality: N/A;  rad tech ok per vicki at [facility]    INTERSTIM IMPLANT PLACEMENT  04/10/2012   Procedure: Barrie Lyme IMPLANT SECOND STAGE;  Surgeon: [name], MD;  Location: [facility];  Service: Urology;  Laterality: N/A;   LOWER LEG SOFT TISSUE TUMOR EXCISION  1994   cyst left ankle -involved muscle removed-limited mobility now   NASAL SEPTUM SURGERY     PUBOVAGINAL SLING N/A 06/04/2019   Procedure: Gaynelle Arabian;  Surgeon: Bjorn Loser, MD;  Location: [facility];  Service: Urology;  Laterality: N/A;   Patient Active Problem List   Diagnosis Date Noted   Stroke ([place]) 04/15/2022   Acute CVA (cerebrovascular accident) ([place]) 04/14/2022   Paroxysmal atrial fibrillation ([place]) 04/04/2022   Cystocele with prolapse 06/04/2019   Other fatigue 10/17/2017   Shortness of breath on exertion 10/17/2017   Essential hypertension 10/17/2017   Prediabetes 10/17/2017   CHRONIC RHINITIS 11/17/2008   COUGH 08/13/2008   Unspecified glaucoma 07/18/2008   Asthma 07/18/2008   GERD 07/18/2008    ONSET DATE: 04/26/2022  REFERRING DIAG: I67.9 (ICD-10-CM) - Cerebrovascular disease, unspecified   THERAPY DIAG:  Muscle weakness (generalized)  Other abnormalities of gait and mobility  Unsteadiness on feet  Acute pain of right knee  Rationale for Evaluation and Treatment: Rehabilitation  SUBJECTIVE:  SUBJECTIVE STATEMENT:  Things are going well at home, the hardest thing for me to do is walk. No falls or close calls at home. HEP makes me sore. Supposed to get an injection in the R knee on 12/6 for arthritis.   Pt accompanied by: self  PERTINENT HISTORY: Pt is a 66 y/o female admitted from urgent care on 04/14/22 secondary to LUE numbness. MRI showed R lateral thalamus infarct. PMH includes glaucoma, HTN, and a fib.Returned to ED on 10/7 with worsening left-sided numbness with MRI negative for new infarct or extension of prior infarct.  Went to ED on 05/10/22 for R knee and calf pain after she heard a pop after a recent fall and pt wasn't unable to weight on her leg, ultrasound was negative for DVT and x-ray showed no acute finding but moderate right knee osteoarthritic changes. Pt discharged with orthopedic follow up.    PAIN:  Are you having pain? Yes: NPRS scale: 2/10 Pain location: R knee  Pain description: constant ache  Aggravating factors: walking  too much  Relieving factors: rest/propping LE up  There were no vitals filed for this visit.   PRECAUTIONS: Fall  WEIGHT BEARING RESTRICTIONS: No  FALLS: Has patient fallen in last 6 months? Yes. 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    OBJECTIVE:   DIAGNOSTIC FINDINGS:  MRI: 04/16/22 IMPRESSION: 1. Acute nonhemorrhagic infarct of the right thalamus is better defined on today's study. This likely represents expected evolution of the infarct rather than any significant progression 2. Remote lacunar infarct of the left thalamus is stable. 3. Scattered white matter disease is otherwise stable. This likely reflects the sequela of chronic microvascular ischemia.  COGNITION: Overall cognitive status: Within functional limits for tasks assessed   SENSATION: Light touch: Impaired  and LUE starting at upper arm and down to hands. LUE feels ice cold  Hot/Cold: Impaired  and difficult to detect with LUE   Light touch WFL - BLE   COORDINATION: Heel to shin: more difficulty to perform with RLE.    POSTURE: forward head  LOWER EXTREMITY ROM:    Pt with incr pain with R knee extension and knee flexion.    LOWER EXTREMITY MMT:    MMT Right Eval Left Eval  Hip flexion 4/5 4+/5  Hip extension    Hip abduction 5/5 5/5  Hip adduction 5/5 5/5  Hip internal  rotation    Hip external rotation    Knee flexion 4+/5 (incr pain) 5/5  Knee extension 4+/5 (incr pain) 5/5  Ankle dorsiflexion 5/5 5/5  Ankle plantarflexion    Ankle inversion    Ankle eversion    (Blank rows = not tested)  All tested in sitting.   BED MOBILITY:  Has been sleeping in her recliner (has slept in one even before the CVA, has been sleeping in one after husband's hip surgery). Would one day want to get back to sleeping in a bed.   TRANSFERS: Assistive device utilized: None  Sit  to stand: SBA Stand to sit: SBA  GAIT: Gait pattern: step through pattern, decreased step length- Left, decreased stance time- Right, decreased stride length, and decreased hip/knee flexion- Right Distance walked: Clinic distances. Assistive device utilized: Environmental consultant - 2 wheeled Level of assistance: SBA Comments: Pt reporting incr R knee pain after ambulating small distances in the clinic. Pt ambulated into clinic with standard RW (having to pick it up and place it back down). Educated on using 2 wheeled RW for improved safety (pt has one at home), used it in session today with pt feeling much better with this.   FUNCTIONAL TESTS:  5 times sit to stand: 21.88 seconds with BUE support, pt reporting 4/10 knee pain afterwards  Timed up and go (TUG): 22.31 seconds with RW 10 meter walk test: 27.4 seconds with RW = 1.20 ft/sec  Berg 47/56 (11/27)    [facility] PT Assessment - 06/06/22 0001       Berg Balance Test   Sit to Stand Able to stand  independently using hands    Standing Unsupported Able to stand 30 seconds unsupported    Sitting with Back Unsupported but Feet Supported on Floor or Stool Able to sit safely and securely 2 minutes    Stand to Sit Controls descent by using hands    Transfers Able to transfer safely, definite need of hands    Standing Unsupported with Eyes Closed Able to stand 10 seconds safely    Standing Unsupported with Feet Together Able to place feet together independently  and stand 1 minute safely    From Standing, Reach Forward with Outstretched Arm Can reach forward >5 cm safely (2")    From Standing Position, Pick up Object from Floor Able to pick up shoe safely and easily    From Standing Position, Turn to Look Behind Over each Shoulder Turn sideways only but maintains balance    Turn 360 Degrees Able to turn 360 degrees safely in 4 seconds or less    Standing Unsupported, Alternately Place Feet on Step/Stool Able to stand independently and safely and complete 8 steps in 20 seconds    Standing Unsupported, One Foot in Front Able to place foot tandem independently and hold 30 seconds    Standing on One Leg Able to lift leg independently and hold > 10 seconds    Total Score 47    Berg comment: more limited by R knee pain than anything               TODAY'S TREATMENT:                                                                                                                                06/06/22  Merrilee Jansky 47/56  TherEx  Bridges with red TB x10 2 second holds Sidelying clams red TB x10 B Walking bridges x5  SLRs with 2# 1x10 L, 2x5 R  Seated LAQs green TB 1x15 L,  1x12 R  3 second holds  Seated HS curls green TB 1x12 B      PATIENT EDUCATION: Education details: Chief Strategy Officer, encouraged safe use of RW for balance and knee pain Person educated: Patient Education method: Customer service manager Education comprehension: verbalized understanding  HOME EXERCISE PROGRAM: Will provide at next session.   GOALS: Goals reviewed with patient? Yes  SHORT TERM GOALS: Target date: 06/24/2022  Pt will be independent with initial HEP in order to build upon functional gains made in therapy.  Baseline: Goal status: INITIAL  2.  BERG to be assessed Baseline:  Goal status: MET  3.  Pt will improve gait speed with RW to at least 2.0 ft/sec in order to demo improved community mobility/decr fall risk.   Baseline: 27.4  seconds with RW = 1.20 ft/sec  Goal status: INITIAL  4.  Pt will improve TUG time to 19 seconds or less with RW vs. LRAD in order to demo decrease fall risk. Baseline: 22.31 seconds with RW Goal status: INITIAL  5.  Pt will improve 5x sit<>stand to less than or equal to 18 sec to demonstrate improved functional strength and transfer efficiency.  Baseline: 21.88 seconds with BUE support, pt reporting 4/10 knee pain afterwards  Goal status: INITIAL    LONG TERM GOALS: Target date: 07/22/2022  Pt will be independent with final HEP in order to build upon functional gains made in therapy.  Baseline:  Goal status: INITIAL  2.  Will score at least 54/56 on Berg balance test  Baseline:  Goal status: INITIAL  3.  Pt will improve gait speed with RW  vs. LRAD to at least 2.6 ft/sec in order to demo improved community mobility/decr fall risk.   Baseline:  27.4 seconds with RW = 1.20 ft/sec  Goal status: INITIAL  4.  Pt will improve TUG time to 15 seconds or less with LRAD vs. No AD in order to demo decrease fall risk. Baseline: 22.31 seconds with RW Goal status: INITIAL  5.  Pt will ambulate at least 345' with LRAD vs. No AD with supervision in order to demo improved household mobility.  Baseline: currently needing to use RW  Goal status: INITIAL  6.  Pt will improve 5x sit<>stand to less than or equal to 15 sec to demonstrate improved functional strength and transfer efficiency.  Baseline: 21.88 seconds with BUE support, pt reporting 4/10 knee pain afterwards  Goal status: INITIAL  ASSESSMENT:  CLINICAL IMPRESSION:  Micaela arrives today doing OK. Finished Berg balance test- actually did quite well, she tells me she feels she struggled on this test more bc of knee pain than difficulty with balance tasks. Had increased pain after WB tasks during [place] so we modified session per her request and focused primarily on non-WB strengthening for the rest of the session. Getting injection R knee  12/6 hopefully this will help her pain and mobility considerably. Session very limited today due to knee pain.    OBJECTIVE IMPAIRMENTS: Abnormal gait, decreased activity tolerance, decreased balance, decreased coordination, decreased endurance, decreased knowledge of use of DME, decreased mobility, difficulty walking, decreased strength, increased fascial restrictions, impaired sensation, impaired UE functional use, and pain.   ACTIVITY LIMITATIONS: carrying, bending, standing, squatting, stairs, transfers, bed mobility, and locomotion level  PARTICIPATION LIMITATIONS: driving, shopping, and community activity  PERSONAL FACTORS: Age, Behavior pattern, Past/current experiences, Time since onset of injury/illness/exacerbation, and 3+ comorbidities: glmore aucoma, HTN, and a fib, R knee pain are also affecting patient's  functional outcome.   REHAB POTENTIAL: Good  CLINICAL DECISION MAKING: Stable/uncomplicated  EVALUATION COMPLEXITY: Low  PLAN:  PT FREQUENCY: 2x/week  PT DURATION: 12 weeks  PLANNED INTERVENTIONS: Therapeutic exercises, Therapeutic activity, Neuromuscular re-education, Balance training, Gait training, Patient/Family education, Self Care, Joint mobilization, Stair training, Vestibular training, DME instructions, Aquatic Therapy, Manual therapy, and Re-evaluation  PLAN FOR NEXT SESSION: R knee strengthening/stretching, balance training, gait training    Trystan Eads U PT DPT PN2  06/06/2022, 1:12 PM

## 2022-06-08 ENCOUNTER — Ambulatory Visit (HOSPITAL_COMMUNITY)
Admission: RE | Admit: 2022-06-08 | Discharge: 2022-06-08 | Disposition: A | Discharge status: Home / Self Care | Payer: Medicare Other | Source: Ambulatory Visit | Attending: Nurse Practitioner | Admitting: Nurse Practitioner

## 2022-06-08 ENCOUNTER — Encounter (HOSPITAL_COMMUNITY): Payer: Self-pay | Admitting: Nurse Practitioner

## 2022-06-08 VITALS — BP 146/70 | HR 50 | Ht 67.0 in | Wt 226.2 lb

## 2022-06-08 DIAGNOSIS — D6869 Other thrombophilia: Secondary | ICD-10-CM

## 2022-06-08 DIAGNOSIS — I480 Paroxysmal atrial fibrillation: Secondary | ICD-10-CM | POA: Insufficient documentation

## 2022-06-08 DIAGNOSIS — Z8673 Personal history of transient ischemic attack (TIA), and cerebral infarction without residual deficits: Secondary | ICD-10-CM | POA: Insufficient documentation

## 2022-06-08 DIAGNOSIS — I10 Essential (primary) hypertension: Secondary | ICD-10-CM | POA: Insufficient documentation

## 2022-06-08 DIAGNOSIS — R531 Weakness: Secondary | ICD-10-CM | POA: Insufficient documentation

## 2022-06-08 DIAGNOSIS — Z79899 Other long term (current) drug therapy: Secondary | ICD-10-CM | POA: Diagnosis not present

## 2022-06-08 MED ORDER — METOPROLOL SUCCINATE ER 25 MG PO TB24: 12.5000 mg | ORAL_TABLET | Freq: Every day | ORAL | 2 refills | Status: DC

## 2022-06-08 NOTE — Progress Notes (Signed)
Primary Care Physician: Shirline Frees, MD Referring Physician: ED f/u    Brandy Love is a 66 y.o. female with a h/o new onset afib presented to the ED 02/04/22. She started having symptoms of feeling hot alternating with cold and short of breath. She felt  possibly her asthma was flaring while riding in the car. She had noted some palpitations in the past but  not that severe. EKG showed afib with controlled rates at  91 bpm. She was started on metoprolol tartrate 25 mg bid with eliquis 5 mg bid with a CHA2DS2VASc  score of 3 and d/c home in afib.   Today, ekg shows SR. She  is taking her eliquis bid. No alcohol, moderate caffeine, no tobacco. Snores with probable apnea per husband. Obese with no exercise routine.    F/u in afib clinic, 03/15/22. I reviewed echo and monitor with pt. She had 2% afib burden and 1.8% sinus rhythm with PAC's, which was what pt triggered on  monitor.   F/u in afib clinic, 06/08/22,                                          for starting Multaq in  September. This is working well for her keeping her in rhythm. She did have an ED visit for thalamic stroke 10/7. She was placed on asa as well as her xarelto and a statin. She still has residual weakness in left arm and is under going  occupational therapy.  She then tripped over the dog at home and injured her rt calf muscle. Per pt this should improve with rest and staying off feet for a while.   Today, she denies symptoms of palpitations, chest pain, shortness of breath, orthopnea, PND, lower extremity edema, dizziness, presyncope, syncope, or neurologic sequela. The patient is tolerating medications without difficulties and is otherwise without complaint today.   Past Medical History:  Diagnosis Date   A-fib ([place])    Asthma    controlled well   Bladder prolapse, female, acquired    Edema extremities    lower legs at times   Gastric ulcer    GERD (gastroesophageal reflux disease)    Glaucoma    High blood  pressure    OAB (overactive bladder)    Pre-diabetes    Prediabetes    Past Surgical History:  Procedure Laterality Date   ABDOMINAL HYSTERECTOMY  1993   ANTERIOR AND POSTERIOR REPAIR N/A 06/04/2019   Procedure: CYSTOSCOPY ANTERIOR (CYSTOCELE);  Surgeon: Bjorn Loser, MD;  Location: [facility];  Service: Urology;  Laterality: N/A;   APPENDECTOMY     CATARACT EXTRACTION  2016   CYSTECTOMY     1994 and 1982   INTERSTIM IMPLANT PLACEMENT  04/10/2012   Procedure: Barrie Lyme IMPLANT FIRST STAGE;  Surgeon: [name], MD;  Location: [facility];  Service: Urology;  Laterality: N/A;  rad tech ok per vicki at main    INTERSTIM IMPLANT PLACEMENT  04/10/2012   Procedure: Barrie Lyme IMPLANT SECOND STAGE;  Surgeon: [name], MD;  Location: [facility];  Service: Urology;  Laterality: N/A;   LOWER LEG SOFT TISSUE TUMOR EXCISION  1994   cyst left ankle -involved muscle removed-limited mobility now   NASAL SEPTUM SURGERY     PUBOVAGINAL SLING N/A 06/04/2019   Procedure: Gaynelle Arabian;  Surgeon: Bjorn Loser, MD;  Location:  [facility];  Service: Urology;  Laterality: N/A;    Current Outpatient Medications  Medication Sig Dispense Refill   albuterol (PROVENTIL HFA;VENTOLIN HFA) 108 (90 Base) MCG/ACT inhaler Inhale 2 puffs into the lungs every 6 (six) hours as needed for wheezing or shortness of breath.     aspirin EC 81 MG tablet Take 1 tablet (81 mg total) by mouth daily. Swallow whole. 30 tablet 0   atorvastatin (LIPITOR) 80 MG tablet Take 1 tablet (80 mg total) by mouth daily. 30 tablet 0   benzonatate (TESSALON) 200 MG capsule Take 200 mg by mouth as needed.     clotrimazole-betamethasone (LOTRISONE) cream Apply 1 Application topically 2 (two) times daily as needed (for irritation).     dronedarone (MULTAQ) 400 MG tablet Take 1 tablet (400 mg total) by mouth 2 (two) times daily with a meal. 180 tablet 1   fluticasone (FLOVENT HFA) 110 MCG/ACT  inhaler Inhale 2 puffs into the lungs 2 (two) times daily as needed (for seasonal flares/asthma).     hydrochlorothiazide (HYDRODIURIL) 25 MG tablet Take 25 mg by mouth every morning.     hyoscyamine (LEVSIN SL) 0.125 MG SL tablet Place 0.125 mg under the tongue 2 (two) times daily as needed for cramping.     losartan (COZAAR) 100 MG tablet Take 1 tablet (100 mg total) by mouth daily. Restart from 04/22/22     metoprolol succinate (TOPROL XL) 25 MG 24 hr tablet Take 0.5 tablets (12.5 mg total) by mouth at bedtime. STOP lopressor 45 tablet 2   omeprazole (PRILOSEC) 40 MG capsule Take 40 mg by mouth daily before breakfast.     rivaroxaban (XARELTO) 20 MG TABS tablet Take 1 tablet (20 mg total) by mouth daily with supper. 90 tablet 1   sertraline (ZOLOFT) 100 MG tablet Take 200 mg by mouth daily.     sucralfate (CARAFATE) 1 g tablet Take 1 g by mouth daily as needed (to coat the stomach).     traMADol (ULTRAM) 50 MG tablet Take 1 tablet (50 mg total) by mouth every 6 (six) hours as needed. 20 tablet 0   triamcinolone (KENALOG) 0.1 % paste 1 Application 2 (two) times daily as needed (for fever blisters- apply to the lips).     TYLENOL 500 MG tablet Take 500-1,000 mg by mouth every 6 (six) hours as needed for mild pain or headache.     No current facility-administered medications for this encounter.    Allergies  Allergen Reactions   Shellfish Allergy Anaphylaxis, Nausea Only, Swelling and Other (See Comments)    Swelling of the throat   Sulfa Antibiotics Hives   Erythromycin Hives   Ibuprofen Other (See Comments)    Vertigo   Adhesive [Tape] Rash and Other (See Comments)    Rash with transderm   Amlodipine Hives, Swelling and Rash   Betadine [Povidone Iodine] Rash and Other (See Comments)    ALL TOPICAL IODINES   Povidone-Iodine Rash and Other (See Comments)    ALL TOPICAL IODINES   Silicone Rash and Other (See Comments)    Rash with transderm    Social History   Socioeconomic  History   Marital status: Married    Spouse name: Armed forces training and education officer   Number of children: 3   Years of education: Not on file   Highest education level: Not on file  Occupational History   Occupation: Building services engineer    Occupation: Retired from [employer]   Smoking status: Never  Smokeless tobacco: Never  Vaping Use   Vaping Use: Never used  Substance and Sexual Activity   Alcohol use: No   Drug use: No   Sexual activity: Not on file  Other Topics Concern   Not on file  Social History Narrative   Not on file   Social Determinants of Health   Financial Resource Strain: Not on file  Food Insecurity: Not on file  Transportation Needs: Not on file  Physical Activity: Not on file  Stress: Not on file  Social Connections: Not on file  Intimate Partner Violence: Not on file    Family History  Problem Relation Age of Onset   Asthma Mother    Diabetes Mother    Hernia Mother    Obesity Mother    Heart disease Father    Prostate cancer Father     ROS- All systems are reviewed and negative except as per the HPI above  Physical Exam: Vitals:   06/08/22 0910  BP: (!) 146/70  Pulse: (!) 50  Weight: 102.6 kg  Height: '5\' 7"'$  (1.702 m)    Wt Readings from Last 3 Encounters:  06/08/22 102.6 kg  05/10/22 103 kg  05/05/22 103.4 kg    Labs: Lab Results  Component Value Date   NA 140 04/16/2022   K 3.8 04/16/2022   CL 107 04/16/2022   CO2 29 04/16/2022   GLUCOSE 120 (H) 04/16/2022   BUN 16 04/16/2022   CREATININE 1.10 (H) 04/16/2022   CALCIUM 8.8 (L) 04/16/2022   MG 2.0 02/04/2022   Lab Results  Component Value Date   INR 2.3 (H) 04/16/2022   Lab Results  Component Value Date   CHOL 181 04/14/2022   HDL 49 04/14/2022   LDLCALC 119 (H) 04/14/2022   TRIG 65 04/14/2022     GEN- The patient is well appearing, alert and oriented x 3 today.   Head- normocephalic, atraumatic Eyes-  Sclera clear, conjunctiva pink Ears- hearing  intact Oropharynx- clear Neck- supple, no JVP Lymph- no cervical lymphadenopathy Lungs- Clear to ausculation bilaterally, normal work of breathing Heart- Regular rate and rhythm, no murmurs, rubs or gallops, PMI not laterally displaced GI- soft, NT, ND, + BS Extremities- no clubbing, cyanosis, or edema MS- no significant deformity or atrophy Skin- no rash or lesion Psych- euthymic mood, full affect Neuro- strength and sensation are intact  EKG-Vent. rate 50 BPM PR interval 200 ms QRS duration 94 ms QT/QTcB 440/401 ms P-R-T axes 54 45 39 Sinus bradycardia Cannot rule out Anterior infarct , age undetermined Abnormal ECG When compared with ECG of 16-Apr-2022 09:27, PREVIOUS ECG IS PRESENT Atrial fibrillation RESOLVED SINCE PREVIOUS Confirmed by Kirk Ruths [id number]) on 06/08/2022 12:35:08 PM  Echo-1. Left ventricular ejection fraction, by estimation, is 55 to 60%. The  left ventricle has normal function. The left ventricle has no regional  wall motion abnormalities. Left ventricular diastolic parameters are  consistent with Grade I diastolic  dysfunction (impaired relaxation).   2. Right ventricular systolic function is normal. The right ventricular  size is normal. Tricuspid regurgitation signal is inadequate for assessing  PA pressure.   3. Left atrial size was mild to moderately dilated.   4. The mitral valve is normal in structure. Mild to moderate mitral valve  regurgitation.   5. The aortic valve is tricuspid. Aortic valve regurgitation is not  visualized. No aortic stenosis is present.   6. The inferior vena cava is normal in size with greater than 50%  respiratory  variability, suggesting right atrial pressure of 3 mmHg.   Zio patch-Patch Wear Time: 14 days and 0 hours    Predominant rhythm was sinus rhythm 2% atrial fibrillation burden 1.8% supraventricular ectopy Less than 1% ventricular ectopy Triggered episodes associated with both sinus rhythm and sinus  rhythm with PACs  Assessment and Plan:  1. New onset afib  Staying in SR with Multaq 400 mg bid  Hepatic enzymes checked  10/7 in normal range   With heart tate of 50 and fatigue, will change metoprolol tartrate to succinate 1/2 tab at hs   2. CHA2DS2VASc  score of 3 Xarelto 20 mg daily    3. HTN Stable today     F/u in 3 months in afib clinic    [place]. Ryaan Vanwagoner, [facility] [address] [place], [place] [address] [phone]

## 2022-06-09 ENCOUNTER — Ambulatory Visit: Payer: Medicare Other | Admitting: Physical Therapy

## 2022-06-09 ENCOUNTER — Encounter: Payer: Medicare Other | Admitting: Occupational Therapy

## 2022-06-13 ENCOUNTER — Ambulatory Visit: Payer: Medicare Other | Admitting: Occupational Therapy

## 2022-06-13 ENCOUNTER — Ambulatory Visit: Payer: Medicare Other | Admitting: Physical Therapy

## 2022-06-15 ENCOUNTER — Ambulatory Visit: Payer: Medicare Other | Admitting: Physical Therapy

## 2022-06-15 ENCOUNTER — Encounter: Payer: Medicare Other | Admitting: Occupational Therapy

## 2022-06-21 ENCOUNTER — Ambulatory Visit: Payer: Medicare Other

## 2022-06-21 ENCOUNTER — Ambulatory Visit: Payer: Medicare Other | Admitting: Occupational Therapy

## 2022-06-24 ENCOUNTER — Ambulatory Visit: Payer: Medicare Other | Admitting: Physical Therapy

## 2022-06-24 ENCOUNTER — Ambulatory Visit: Payer: Medicare Other | Admitting: Occupational Therapy

## 2022-06-28 ENCOUNTER — Ambulatory Visit: Payer: Medicare Other

## 2022-06-28 ENCOUNTER — Ambulatory Visit: Payer: Medicare Other | Admitting: Occupational Therapy

## 2022-07-06 ENCOUNTER — Encounter: Payer: Medicare Other | Admitting: Occupational Therapy

## 2022-07-06 ENCOUNTER — Ambulatory Visit: Payer: Medicare Other | Admitting: Physical Therapy

## 2022-07-13 ENCOUNTER — Ambulatory Visit: Payer: Medicare Other | Admitting: Physical Therapy

## 2022-07-13 ENCOUNTER — Encounter: Payer: Medicare Other | Admitting: Occupational Therapy

## 2022-07-15 ENCOUNTER — Encounter: Payer: Medicare Other | Admitting: Occupational Therapy

## 2022-07-15 ENCOUNTER — Ambulatory Visit: Payer: Medicare Other | Admitting: Physical Therapy

## 2022-07-18 ENCOUNTER — Encounter: Payer: Medicare Other | Admitting: Occupational Therapy

## 2022-07-18 ENCOUNTER — Ambulatory Visit: Payer: Medicare Other

## 2022-07-20 ENCOUNTER — Ambulatory Visit: Payer: Medicare Other | Admitting: Physical Therapy

## 2022-07-20 ENCOUNTER — Encounter: Payer: Medicare Other | Admitting: Occupational Therapy

## 2022-07-25 ENCOUNTER — Ambulatory Visit: Payer: Medicare Other | Admitting: Physical Therapy

## 2022-07-25 ENCOUNTER — Encounter: Payer: Medicare Other | Admitting: Occupational Therapy

## 2022-07-27 ENCOUNTER — Encounter: Payer: Medicare Other | Admitting: Occupational Therapy

## 2022-07-27 ENCOUNTER — Ambulatory Visit: Payer: Medicare Other | Admitting: Physical Therapy

## 2022-08-01 ENCOUNTER — Encounter: Payer: Medicare Other | Admitting: Occupational Therapy

## 2022-08-01 ENCOUNTER — Ambulatory Visit: Payer: Medicare Other | Admitting: Physical Therapy

## 2022-08-03 ENCOUNTER — Encounter: Payer: Medicare Other | Admitting: Occupational Therapy

## 2022-08-03 ENCOUNTER — Encounter (HOSPITAL_COMMUNITY): Payer: Self-pay

## 2022-08-03 ENCOUNTER — Telehealth (HOSPITAL_COMMUNITY): Payer: Self-pay | Admitting: *Deleted

## 2022-08-03 ENCOUNTER — Ambulatory Visit: Payer: Medicare Other | Admitting: Physical Therapy

## 2022-08-03 NOTE — Telephone Encounter (Signed)
Patient approved for multaq assistance through 08/03/23 PAT [id number]

## 2022-08-08 ENCOUNTER — Encounter: Payer: Medicare Other | Admitting: Occupational Therapy

## 2022-08-08 ENCOUNTER — Ambulatory Visit: Payer: Medicare Other | Admitting: Physical Therapy

## 2022-08-18 ENCOUNTER — Encounter (HOSPITAL_COMMUNITY): Payer: Self-pay | Admitting: *Deleted

## 2022-08-30 ENCOUNTER — Encounter (HOSPITAL_COMMUNITY): Payer: Self-pay | Admitting: Nurse Practitioner

## 2022-08-30 ENCOUNTER — Ambulatory Visit (HOSPITAL_COMMUNITY)
Admission: RE | Admit: 2022-08-30 | Discharge: 2022-08-30 | Disposition: A | Discharge status: Home / Self Care | Payer: Medicare Other | Source: Ambulatory Visit | Attending: Nurse Practitioner | Admitting: Nurse Practitioner

## 2022-08-30 VITALS — BP 162/92 | HR 73 | Ht 67.0 in | Wt 216.2 lb

## 2022-08-30 DIAGNOSIS — I10 Essential (primary) hypertension: Secondary | ICD-10-CM | POA: Insufficient documentation

## 2022-08-30 DIAGNOSIS — D6869 Other thrombophilia: Secondary | ICD-10-CM

## 2022-08-30 DIAGNOSIS — I480 Paroxysmal atrial fibrillation: Secondary | ICD-10-CM | POA: Insufficient documentation

## 2022-08-30 DIAGNOSIS — I159 Secondary hypertension, unspecified: Secondary | ICD-10-CM

## 2022-08-30 DIAGNOSIS — I340 Nonrheumatic mitral (valve) insufficiency: Secondary | ICD-10-CM | POA: Insufficient documentation

## 2022-08-30 NOTE — Progress Notes (Addendum)
Primary Care Physician: Shirline Frees, MD Referring Physician: ED f/u    Brandy Love is a 67 y.o. female with a h/o new onset afib presented to the ED 02/04/22. She started having symptoms of feeling hot alternating with cold and short of breath. She Love  possibly her asthma was flaring while riding in the car. She had noted some palpitations in the past but  not that severe. EKG showed afib with controlled rates at  91 bpm. She was started on metoprolol tartrate 25 mg bid with eliquis 5 mg bid with a CHA2DS2VASc  score of 3 and d/c home in afib.   Today, ekg shows SR. She  is taking her eliquis bid. No alcohol, moderate caffeine, no tobacco. Snores with probable apnea per husband. Obese with no exercise routine.    F/u in afib clinic, 03/15/22. I reviewed echo and monitor with pt. She had 2% afib burden and 1.8%  SV ectopy, sinus rhythm with PAC's, which was what pt triggered on  monitor.   F/u in afib clinic, 06/08/22,  for starting Multaq in  September. This is working well for her keeping her in rhythm. She did have an ED visit for thalamic stroke 10/7. She was placed on asa as well as her xarelto and a statin. She still has residual weakness in left arm and is under going  occupational therapy.  She then tripped over the dog at home and injured her rt calf muscle. Per pt this should improve with rest and staying off feet for a while.   F/u in afib clinic, 08/30/22. She is n SR. No sustained afib to report. Some palps at times. She is hypertensive here today and BP's at home running around [id number] systolic. She has lost weight from  276 to 216 thru reducing caloric intake.   Today, she denies symptoms of palpitations, chest pain, shortness of breath, orthopnea, PND, lower extremity edema, dizziness, presyncope, syncope, or neurologic sequela. The patient is tolerating medications without difficulties and is otherwise without complaint today.   Past Medical History:  Diagnosis Date   A-fib  ([place])    Asthma    controlled well   Bladder prolapse, female, acquired    Edema extremities    lower legs at times   Gastric ulcer    GERD (gastroesophageal reflux disease)    Glaucoma    High blood pressure    OAB (overactive bladder)    Pre-diabetes    Prediabetes    Past Surgical History:  Procedure Laterality Date   ABDOMINAL HYSTERECTOMY  1993   ANTERIOR AND POSTERIOR REPAIR N/A 06/04/2019   Procedure: CYSTOSCOPY ANTERIOR (CYSTOCELE);  Surgeon: Bjorn Loser, MD;  Location: [facility];  Service: Urology;  Laterality: N/A;   APPENDECTOMY     CATARACT EXTRACTION  2016   CYSTECTOMY     1994 and 1982   INTERSTIM IMPLANT PLACEMENT  04/10/2012   Procedure: Barrie Lyme IMPLANT FIRST STAGE;  Surgeon: [name], MD;  Location: [facility];  Service: Urology;  Laterality: N/A;  rad tech ok per vicki at [facility]    INTERSTIM IMPLANT PLACEMENT  04/10/2012   Procedure: Barrie Lyme IMPLANT SECOND STAGE;  Surgeon: [name], MD;  Location: [facility];  Service: Urology;  Laterality: N/A;   LOWER LEG SOFT TISSUE TUMOR EXCISION  1994   cyst left ankle -involved muscle removed-limited mobility now   NASAL SEPTUM SURGERY     PUBOVAGINAL SLING N/A 06/04/2019   Procedure:  PUBO-VAGINAL SLING;  Surgeon: Bjorn Loser, MD;  Location: [facility];  Service: Urology;  Laterality: N/A;    Current Outpatient Medications  Medication Sig Dispense Refill   albuterol (PROVENTIL HFA;VENTOLIN HFA) 108 (90 Base) MCG/ACT inhaler Inhale 2 puffs into the lungs every 6 (six) hours as needed for wheezing or shortness of breath.     aspirin EC 81 MG tablet Take 1 tablet (81 mg total) by mouth daily. Swallow whole. 30 tablet 0   atorvastatin (LIPITOR) 80 MG tablet Take 1 tablet (80 mg total) by mouth daily. 30 tablet 0   benzonatate (TESSALON) 200 MG capsule Take 200 mg by mouth as needed.     clotrimazole-betamethasone (LOTRISONE) cream Apply 1 Application topically 2 (two)  times daily as needed (for irritation).     dronedarone (MULTAQ) 400 MG tablet Take 1 tablet (400 mg total) by mouth 2 (two) times daily with a meal. 180 tablet 1   fluticasone (FLOVENT HFA) 110 MCG/ACT inhaler Inhale 2 puffs into the lungs 2 (two) times daily as needed (for seasonal flares/asthma).     hydrochlorothiazide (HYDRODIURIL) 25 MG tablet Take 25 mg by mouth every morning.     hyoscyamine (LEVSIN SL) 0.125 MG SL tablet Place 0.125 mg under the tongue 2 (two) times daily as needed for cramping.     losartan (COZAAR) 100 MG tablet Take 1 tablet (100 mg total) by mouth daily. Restart from 04/22/22     metoprolol succinate (TOPROL XL) 25 MG 24 hr tablet Take 0.5 tablets (12.5 mg total) by mouth at bedtime. STOP lopressor 45 tablet 2   omeprazole (PRILOSEC) 40 MG capsule Take 40 mg by mouth daily before breakfast.     rivaroxaban (XARELTO) 20 MG TABS tablet Take 1 tablet (20 mg total) by mouth daily with supper. 90 tablet 1   sertraline (ZOLOFT) 100 MG tablet Take 200 mg by mouth daily.     sucralfate (CARAFATE) 1 g tablet Take 1 g by mouth daily as needed (to coat the stomach).     traMADol (ULTRAM) 50 MG tablet Take 1 tablet (50 mg total) by mouth every 6 (six) hours as needed. 20 tablet 0   triamcinolone (KENALOG) 0.1 % paste 1 Application 2 (two) times daily as needed (for fever blisters- apply to the lips).     TYLENOL 500 MG tablet Take 500-1,000 mg by mouth every 6 (six) hours as needed for mild pain or headache.     No current facility-administered medications for this encounter.    Allergies  Allergen Reactions   Shellfish Allergy Anaphylaxis, Nausea Only, Swelling and Other (See Comments)    Swelling of the throat   Sulfa Antibiotics Hives   Erythromycin Hives   Ibuprofen Other (See Comments)    Vertigo   Adhesive [Tape] Rash and Other (See Comments)    Rash with transderm   Amlodipine Hives, Swelling and Rash   Betadine [Povidone Iodine] Rash and Other (See Comments)     ALL TOPICAL IODINES   Povidone-Iodine Rash and Other (See Comments)    ALL TOPICAL IODINES   Silicone Rash and Other (See Comments)    Rash with transderm    Social History   Socioeconomic History   Marital status: Married    Spouse name: Armed forces training and education officer   Number of children: 3   Years of education: Not on file   Highest education level: Not on file  Occupational History   Occupation: Building services engineer    Occupation: Retired from [employer]  Tobacco Use   Smoking status: Never   Smokeless tobacco: Never  Vaping Use   Vaping Use: Never used  Substance and Sexual Activity   Alcohol use: No   Drug use: No   Sexual activity: Not on file  Other Topics Concern   Not on file  Social History Narrative   Not on file   Social Determinants of Health   Financial Resource Strain: Not on file  Food Insecurity: Not on file  Transportation Needs: Not on file  Physical Activity: Not on file  Stress: Not on file  Social Connections: Not on file  Intimate Partner Violence: Not on file    Family History  Problem Relation Age of Onset   Asthma Mother    Diabetes Mother    Hernia Mother    Obesity Mother    Heart disease Father    Prostate cancer Father     ROS- All systems are reviewed and negative except as per the HPI above  Physical Exam: Vitals:   06/08/22 0910  BP: (!) 146/70  Pulse: (!) 50  Weight: 102.6 kg  Height: 5' 7"$  (1.702 m)    Wt Readings from Last 3 Encounters:  06/08/22 102.6 kg  05/10/22 103 kg  05/05/22 103.4 kg    Labs: Lab Results  Component Value Date   NA 140 04/16/2022   K 3.8 04/16/2022   CL 107 04/16/2022   CO2 29 04/16/2022   GLUCOSE 120 (H) 04/16/2022   BUN 16 04/16/2022   CREATININE 1.10 (H) 04/16/2022   CALCIUM 8.8 (L) 04/16/2022   MG 2.0 02/04/2022   Lab Results  Component Value Date   INR 2.3 (H) 04/16/2022   Lab Results  Component Value Date   CHOL 181 04/14/2022   HDL 49 04/14/2022   LDLCALC 119 (H) 04/14/2022    TRIG 65 04/14/2022     GEN- The patient is well appearing, alert and oriented x 3 today.   Head- normocephalic, atraumatic Eyes-  Sclera clear, conjunctiva pink Ears- hearing intact Oropharynx- clear Neck- supple, no JVP Lymph- no cervical lymphadenopathy Lungs- Clear to ausculation bilaterally, normal work of breathing Heart- Regular rate and rhythm, no murmurs, rubs or gallops, PMI not laterally displaced GI- soft, NT, ND, + BS Extremities- no clubbing, cyanosis, or edema MS- no significant deformity or atrophy Skin- no rash or lesion Psych- euthymic mood, full affect Neuro- strength and sensation are intact  EKG- Vent. rate 73 BPM PR interval 202 ms QRS duration 88 ms QT/QTcB 392/431 ms P-R-T axes 74 29 40 Normal sinus rhythm Cannot rule out Anterior infarct , age undetermined Abnormal ECG When compared with ECG of 08-Jun-2022 09:22, PREVIOUS ECG IS PRESENT  Echo-1. Left ventricular ejection fraction, by estimation, is 55 to 60%. The  left ventricle has normal function. The left ventricle has no regional  wall motion abnormalities. Left ventricular diastolic parameters are  consistent with Grade I diastolic  dysfunction (impaired relaxation).   2. Right ventricular systolic function is normal. The right ventricular  size is normal. Tricuspid regurgitation signal is inadequate for assessing  PA pressure.   3. Left atrial size was mild to moderately dilated.   4. The mitral valve is normal in structure. Mild to moderate mitral valve  regurgitation.   5. The aortic valve is tricuspid. Aortic valve regurgitation is not  visualized. No aortic stenosis is present.   6. The inferior vena cava is normal in size with greater than 50%  respiratory variability, suggesting right  atrial pressure of 3 mmHg.   Zio patch-Patch Wear Time: 14 days and 0 hours    Predominant rhythm was sinus rhythm 2% atrial fibrillation burden 1.8% supraventricular ectopy Less than 1% ventricular  ectopy Triggered episodes associated with both sinus rhythm and sinus rhythm with PACs  Assessment and Plan:  1. New onset afib fall 2023 Staying in SR with Multaq 400 mg bid  Hepatic enzymes checked  10/7 in normal range  Continue metoprolol succinate 12.5 mg at hs   2. CHA2DS2VASc  score of 3 Xarelto 20 mg daily    3. HTN Elevated recently despite weight loss of 50+ lbs in the last year Already on arb, diuretic and BB She has had allergic reaction to amlodipine in the past with hives, rash, swelling  Will refer to Dr. Oval Linsey for management of HTN as well as general cardiology care  F/u in afib clinic as needed    Butch Penny C. Tionna Gigante, [facility] [address] [place], [place] [address] [phone]

## 2022-08-30 NOTE — Addendum Note (Signed)
Encounter addended by: Sherran Needs, NP on: 08/30/2022 9:24 AM  Actions taken: Clinical Note Signed

## 2022-09-23 ENCOUNTER — Other Ambulatory Visit (HOSPITAL_COMMUNITY): Payer: Self-pay | Admitting: Nurse Practitioner

## 2022-11-07 ENCOUNTER — Encounter (HOSPITAL_BASED_OUTPATIENT_CLINIC_OR_DEPARTMENT_OTHER): Payer: Self-pay | Admitting: Cardiovascular Disease

## 2022-11-07 ENCOUNTER — Ambulatory Visit (INDEPENDENT_AMBULATORY_CARE_PROVIDER_SITE_OTHER): Payer: Medicare Other | Admitting: Cardiovascular Disease

## 2022-11-07 VITALS — BP 132/68 | HR 59 | Ht 67.0 in | Wt 214.6 lb

## 2022-11-07 DIAGNOSIS — I10 Essential (primary) hypertension: Secondary | ICD-10-CM | POA: Diagnosis not present

## 2022-11-07 DIAGNOSIS — I480 Paroxysmal atrial fibrillation: Secondary | ICD-10-CM | POA: Diagnosis not present

## 2022-11-07 MED ORDER — VALSARTAN-HYDROCHLOROTHIAZIDE 320-25 MG PO TABS: 1.0000 | ORAL_TABLET | Freq: Every day | ORAL | 3 refills | Status: DC

## 2022-11-07 MED ORDER — SPIRONOLACTONE 25 MG PO TABS: 25.0000 mg | ORAL_TABLET | Freq: Every day | ORAL | 3 refills | Status: DC

## 2022-11-07 MED ORDER — MULTAQ 400 MG PO TABS: 400.0000 mg | ORAL_TABLET | Freq: Two times a day (BID) | ORAL | 0 refills | Status: DC

## 2022-11-07 NOTE — Patient Instructions (Signed)
Medication Instructions:  STOP LOSARTAN   STOP HYDROCHLOROTHIAZIDE   START VALSARTAN-HCT   START SPIRONOLACTONE 25 MG DAILY   *If you need a refill on your cardiac medications before your next appointment, please call your pharmacy*   Lab Work: CMET/RENIN/ALDOSTERONE/MAGNESIUM 1 WEEK   If you have labs (blood work) drawn today and your tests are completely normal, you will receive your results only by: MyChart Message (if you have MyChart) OR A paper copy in the mail If you have any lab test that is abnormal or we need to change your treatment, we will call you to review the results.   Testing/Procedures: Your physician has requested that you have a renal artery duplex. During this test, an ultrasound is used to evaluate blood flow to the kidneys. Allow one hour for this exam. Do not eat after midnight the day before and avoid carbonated beverages. Take your medications as you usually do.  Follow-Up: 1 MONTH WITH CAITLIN W NP IN [facility]  Other Instructions MONITOR YOUR BLOOD PRESSURE TWICE A DAY, LOG IN THE BOOK PROVIDED. BRING THE BOOK AND YOUR BLOOD PRESSURE MACHINE TO YOUR FOLLOW UP IN 1 MONTH

## 2022-11-07 NOTE — Progress Notes (Signed)
Cardiology Office Note:    Date:  11/07/2022   ID:  Brandy Love, DOB Jul 21, 1955, MRN [id number]  PCP:  Johny Blamer, MD   [facility] Providers Cardiologist:  None     Referring MD: Newman Nip, NP   CC:  Hypertension   History of Present Illness:    Brandy Love is a 67 y.o. female with a hx of paroxysmal atrial fibrillation, stroke, hypertension, asthma, gastric ulcer, GERD, here for the evaluation of hypertension.  She presented to the ED 02/04/22 with complaints of palpitations, shortness of breath, with alternately feeling hot or cold. Her EKG in the ED showed rate-controlled atrial fibrillation at 91 bpm. She was strated on metoprolol tartrate 25 mg BID and Eliquis 5 mg BID. CHADSVASc score of 3. She was discharged home in atrial fibrillation. She wore a monitor showing 2% Afib burden and 1.8% SV ectopy, sinus rhythm with PAC's.   On 04/16/2022 she presented to the ED with thalamic stroke. She was started on 81 mg ASA in conjunction with her Xarelto and statin.  She was seen by Rudi Coco, NP 08/30/2022 and was maintaining sinus rhythm on Multaq 400 mg BID, without episodes of sustained Afib in the interim. Continued 12.5 mg metoprolol succinate. She was hypertensive in clinic, and at home to the 150s systolic. She was on ARB, diuretic, and beta-blocker. She previously developed hives and swelling on amlodipine. She was referred for hypertension management and general cardiology care.  Today, she states that she is feeling good. She has had high blood pressure since 2011, more recently difficult to control which she attributes to significant stress. Her mother was in the ICU for 35 days before she passed. Her father had a massive heart attack 6 months later and she has been his primary caretaker. For a time she was monitoring her blood pressure but has since fallen out of the habit. When she did check, her readings averaged 145-155/90. She confirms developing hives when  she was previously on amlodipine. At times she does feel her heart beating strongly. Otherwise she denies recent episodes of atrial fibrillation. She endorses a history of 8 mini strokes. Since October she hasn't been able to formally exercise. She continues to experience constant left arm numbness and spasms of her left finger. Her left hand spontaneously cramps and forces her to close her fist. In the past few years she has noticed LE swelling. This will improve with elevation. She reports losing weight from 276 to 214 lbs. She has not been able to complete much formal exercise since her prior fall and right knee injury. Her diet consists of a mixture of cooking meals and ordering out. She has been monitoring her sodium intake and doesn't drink a lot of caffeine. No alcohol consumption. She endorses snoring, sometimes waking herself up. She had a sleep study last October. She denies any chest pain, shortness of breath, or peripheral edema. No lightheadedness, headaches, syncope, orthopnea, or PND.   Past Medical History:  Diagnosis Date   A-fib (HCC)    Asthma    controlled well   Bladder prolapse, female, acquired    Edema extremities    lower legs at times   Gastric ulcer    GERD (gastroesophageal reflux disease)    Glaucoma    High blood pressure    OAB (overactive bladder)    Pre-diabetes    Prediabetes     Past Surgical History:  Procedure Laterality Date   ABDOMINAL HYSTERECTOMY  1993  ANTERIOR AND POSTERIOR REPAIR N/A 06/04/2019   Procedure: CYSTOSCOPY ANTERIOR (CYSTOCELE);  Surgeon: Alfredo Martinez, MD;  Location: [facility];  Service: Urology;  Laterality: N/A;   APPENDECTOMY     CATARACT EXTRACTION  2016   CYSTECTOMY     1994 and 1982   INTERSTIM IMPLANT PLACEMENT  04/10/2012   Procedure: Leane Platt IMPLANT FIRST STAGE;  Surgeon: Martina Sinner, MD;  Location: [facility];  Service: Urology;  Laterality: N/A;  rad tech ok per vicki at [facility]    INTERSTIM  IMPLANT PLACEMENT  04/10/2012   Procedure: Leane Platt IMPLANT SECOND STAGE;  Surgeon: Martina Sinner, MD;  Location: [facility];  Service: Urology;  Laterality: N/A;   LOWER LEG SOFT TISSUE TUMOR EXCISION  1994   cyst left ankle -involved muscle removed-limited mobility now   NASAL SEPTUM SURGERY     PUBOVAGINAL SLING N/A 06/04/2019   Procedure: Leonides Grills;  Surgeon: Alfredo Martinez, MD;  Location: [facility];  Service: Urology;  Laterality: N/A;    Current Medications: Current Meds  Medication Sig   albuterol (PROVENTIL HFA;VENTOLIN HFA) 108 (90 Base) MCG/ACT inhaler Inhale 2 puffs into the lungs every 6 (six) hours as needed for wheezing or shortness of breath.   benzonatate (TESSALON) 200 MG capsule Take 200 mg by mouth as needed.   clotrimazole-betamethasone (LOTRISONE) cream Apply 1 Application topically 2 (two) times daily as needed (for irritation).   dronedarone (MULTAQ) 400 MG tablet Take 1 tablet (400 mg total) by mouth 2 (two) times daily with a meal.   dronedarone (MULTAQ) 400 MG tablet Take 1 tablet (400 mg total) by mouth 2 (two) times daily with a meal for 12 days.   fluticasone (FLOVENT HFA) 110 MCG/ACT inhaler Inhale 2 puffs into the lungs 2 (two) times daily as needed (for seasonal flares/asthma).   hyoscyamine (LEVSIN SL) 0.125 MG SL tablet Place 0.125 mg under the tongue 2 (two) times daily as needed for cramping.   metoprolol succinate (TOPROL XL) 25 MG 24 hr tablet Take 0.5 tablets (12.5 mg total) by mouth at bedtime. STOP lopressor   omeprazole (PRILOSEC) 40 MG capsule Take 40 mg by mouth daily before breakfast.   sertraline (ZOLOFT) 100 MG tablet Take 200 mg by mouth daily.   spironolactone (ALDACTONE) 25 MG tablet Take 1 tablet (25 mg total) by mouth daily.   sucralfate (CARAFATE) 1 g tablet Take 1 g by mouth daily as needed (to coat the stomach).   triamcinolone (KENALOG) 0.1 % paste 1 Application 2 (two) times daily as needed (for fever  blisters- apply to the lips).   TYLENOL 500 MG tablet Take 500-1,000 mg by mouth every 6 (six) hours as needed for mild pain or headache.   valsartan-hydrochlorothiazide (DIOVAN-HCT) 320-25 MG tablet Take 1 tablet by mouth daily.   XARELTO 20 MG TABS tablet TAKE 1 TABLET BY MOUTH DAILY WITH SUPPER.   [DISCONTINUED] hydrochlorothiazide (HYDRODIURIL) 25 MG tablet Take 25 mg by mouth every morning.   [DISCONTINUED] losartan (COZAAR) 100 MG tablet Take 1 tablet (100 mg total) by mouth daily. Restart from 04/22/22     Allergies:   Shellfish allergy, Sulfa antibiotics, Erythromycin, Ibuprofen, Adhesive [tape], Amlodipine, Betadine [povidone iodine], Povidone-iodine, and Silicone   Social History   Socioeconomic History   Marital status: Married    Spouse name: Chief Operating Officer   Number of children: 3   Years of education: Not on file   Highest education level: Not on file  Occupational History  Occupation: Engineer, building services    Occupation: Retired from [employer]  Tobacco Use   Smoking status: Never   Smokeless tobacco: Never  Building services engineer Use: Never used  Substance and Sexual Activity   Alcohol use: No   Drug use: No   Sexual activity: Not on file  Other Topics Concern   Not on file  Social History Narrative   Not on file   Social Determinants of Health   Financial Resource Strain: Not on file  Food Insecurity: No Food Insecurity (11/07/2022)   Hunger Vital Sign    Worried About Running Out of Food in the Last Year: Never true    Ran Out of Food in the Last Year: Never true  Transportation Needs: No Transportation Needs (11/07/2022)   PRAPARE - Administrator, Civil Service (Medical): No    Lack of Transportation (Non-Medical): No  Physical Activity: Inactive (11/07/2022)   Exercise Vital Sign    Days of Exercise per Week: 0 days    Minutes of Exercise per Session: 0 min  Stress: Not on file  Social Connections: Not on file     Family History: The  patient's family history includes Asthma in her mother; Atrial fibrillation in her brother; Diabetes in her mother; Heart disease in her father; Hernia in her mother; Obesity in her mother; Prostate cancer in her father.  ROS:   Please see the history of present illness.    (+) Stress (+) LUE numbness (+) Left hand muscle spasms/cramps (+) Palpitations (+) LE edema (+) LE muscle cramps (+) Snoring All other systems reviewed and are negative.  EKGs/Labs/Other Studies Reviewed:    The following studies were reviewed today:  Echocardiogram  04/15/2022:  1. Left ventricular ejection fraction, by estimation, is 55 to 60%. The  left ventricle has normal function. The left ventricle has no regional  wall motion abnormalities. Left ventricular diastolic parameters are  consistent with Grade I diastolic  dysfunction (impaired relaxation).   2. Right ventricular systolic function is normal. The right ventricular  size is normal. Tricuspid regurgitation signal is inadequate for assessing  PA pressure.   3. Left atrial size was mildly dilated.   4. The mitral valve is grossly normal. Trivial mitral valve  regurgitation.   5. The aortic valve was not well visualized. Aortic valve regurgitation  is not visualized. Aortic valve sclerosis/calcification is present,  without any evidence of aortic stenosis.   6. The inferior vena cava is normal in size with greater than 50%  respiratory variability, suggesting right atrial pressure of 3 mmHg.   Comparison(s): Compared to prior TTE in 03/02/22, the MR appears less.  Otherwise, there is no significant change.   Conclusion(s)/Recommendation(s): No intracardiac source of embolism  detected on this transthoracic study. Consider a transesophageal  echocardiogram to exclude cardiac source of embolism if clinically  indicated.   Monitor 02/2022: Patch Wear Time:  14 days and 0 hours    Predominant rhythm was sinus rhythm 2% atrial fibrillation  burden 1.8% supraventricular ectopy Less than 1% ventricular ectopy Triggered episodes associated with both sinus rhythm and sinus rhythm with PACs  EKG:   EKG is personally reviewed. 11/07/2022: Sinus bradycardia. Rate 59 bpm. First degree AV block.   Recent Labs: 02/04/2022: B Natriuretic Peptide 50.7; Magnesium 2.0 04/16/2022: ALT 17; BUN 16; Creatinine, Ser 1.10; Hemoglobin 12.6; Platelets 181; Potassium 3.8; Sodium 140   Recent Lipid Panel    Component Value Date/Time   CHOL 181 04/14/2022  2218   CHOL 189 07/29/2019 1455   TRIG 65 04/14/2022 2218   HDL 49 04/14/2022 2218   HDL 43 07/29/2019 1455   CHOLHDL 3.7 04/14/2022 2218   VLDL 13 04/14/2022 2218   LDLCALC 119 (H) 04/14/2022 2218   LDLCALC 125 (H) 07/29/2019 1455     Risk Assessment/Calculations:          Physical Exam:    Wt Readings from Last 3 Encounters:  11/07/22 214 lb 9.6 oz (97.3 kg)  08/30/22 216 lb 3.2 oz (98.1 kg)  06/08/22 226 lb 3.2 oz (102.6 kg)     VS:  BP 132/68 (BP Location: Right Arm, Patient Position: Sitting, Cuff Size: Large)   Pulse (!) 59   Ht 5\' 7"  (1.702 m)   Wt 214 lb 9.6 oz (97.3 kg)   BMI 33.61 kg/m  , BMI Body mass index is 33.61 kg/m. GENERAL:  Well appearing HEENT: Pupils equal round and reactive, fundi not visualized, oral mucosa unremarkable NECK:  No jugular venous distention, waveform within normal limits, carotid upstroke brisk and symmetric, no bruits, no thyromegaly LUNGS:  Clear to auscultation bilaterally HEART:  RRR.  PMI not displaced or sustained,S1 and S2 within normal limits, no S3, no S4, no clicks, no rubs, no murmurs ABD:  Flat, positive bowel sounds normal in frequency in pitch, no bruits, no rebound, no guarding, no midline pulsatile mass, no hepatomegaly, no splenomegaly EXT:  2 plus pulses throughout, no edema, no cyanosis no clubbing SKIN:  No rashes no nodules.  NEURO:  Cranial nerves II through XII grossly intact, motor grossly intact  throughout PSYCH:  Cognitively intact, oriented to person place and time   ASSESSMENT:    1. Essential hypertension   2. Paroxysmal atrial fibrillation (HCC)    PLAN:    Essential hypertension BP uncontrolled on multiple agents.  We will check renal artery Dopplers.  Stop losartan and HCTZ.  Switch to valsartan/HCTZ at 320/25mg  daily.  Add spironolactone 25mg  daily.  Check BMP in one week.    Screening for Secondary Hypertension:      11/07/2022    2:55 PM  Causes  Drugs/Herbals Screened     - Comments limits salt.  no caffeine/EtOH.  Renovascular HTN Screened  Sleep Apnea Screened  Thyroid Disease Screened  Hyperaldosteronism Screened     - Comments check renin/aldosterone  Pheochromocytoma N/A  Cushing's Syndrome N/A  Hyperparathyroidism Screened  Coarctation of the Aorta Screened     - Comments BP symmetric  Compliance Screened    Relevant Labs/Studies:    Latest Ref Rng & Units 04/16/2022    9:33 AM 04/13/2022    5:24 PM 03/15/2022    9:26 AM  Basic Labs  Sodium 135 - 145 mmol/L 140  139  140   Potassium 3.5 - 5.1 mmol/L 3.8  4.1  4.1   Creatinine 0.44 - 1.00 mg/dL 1.61  0.96  0.45        Latest Ref Rng & Units 07/29/2019    2:55 PM 10/17/2017    9:38 AM  Thyroid   TSH 0.450 - 4.500 uIU/mL 2.350  2.920                 11/07/2022    3:31 PM  Renovascular   Renal Artery [place] Completed Yes      Paroxysmal atrial fibrillation (HCC) Currently maintaining sinus rhythm.  Continue metoprolol, dronedarone, and Xarelto.    Disposition: FU with APP/PharmD in 1 month.  Medication Adjustments/Labs and  Tests Ordered: Current medicines are reviewed at length with the patient today.  Concerns regarding medicines are outlined above.   Orders Placed This Encounter  Procedures   Aldosterone + renin activity w/ ratio   Magnesium   Comprehensive metabolic panel   EKG 12-Lead   VAS US RENAL ARTERY DUPLEX   Meds ordered this encounter  Medications    valsartan-hydrochlorothiazide (DIOVAN-HCT) 320-25 MG tablet    Sig: Take 1 tablet by mouth daily.    Dispense:  90 tablet    Refill:  3    D/C HCTZ AND LOSARTAN   spironolactone (ALDACTONE) 25 MG tablet    Sig: Take 1 tablet (25 mg total) by mouth daily.    Dispense:  90 tablet    Refill:  3   dronedarone (MULTAQ) 400 MG tablet    Sig: Take 1 tablet (400 mg total) by mouth 2 (two) times daily with a meal for 12 days.    Dispense:  24 tablet    Refill:  0    Order Specific Question:   Lot Number?    Answer:   [id number]    Order Specific Question:   Expiration Date?    Answer:   06/10/2024   Patient Instructions  Medication Instructions:  STOP LOSARTAN   STOP HYDROCHLOROTHIAZIDE   START VALSARTAN-HCT   START SPIRONOLACTONE 25 MG DAILY   *If you need a refill on your cardiac medications before your next appointment, please call your pharmacy*   Lab Work: CMET/RENIN/ALDOSTERONE/MAGNESIUM 1 WEEK   If you have labs (blood work) drawn today and your tests are completely normal, you will receive your results only by: MyChart Message (if you have MyChart) OR A paper copy in the mail If you have any lab test that is abnormal or we need to change your treatment, we will call you to review the results.   Testing/Procedures: Your physician has requested that you have a renal artery duplex. During this test, an ultrasound is used to evaluate blood flow to the kidneys. Allow one hour for this exam. Do not eat after midnight the day before and avoid carbonated beverages. Take your medications as you usually do.  Follow-Up: 1 MONTH WITH CAITLIN W NP IN [facility]V [facility] CLINIC  Other Instructions MONITOR YOUR BLOOD PRESSURE TWICE A DAY, LOG IN THE BOOK PROVIDED. BRING THE BOOK AND YOUR BLOOD PRESSURE MACHINE TO YOUR FOLLOW UP IN 1 MONTH     I,Mathew Stumpf,acting as a scribe for Chilton Si, MD.,have documented all relevant documentation on the behalf of Chilton Si, MD,as directed by   Chilton Si, MD while in the presence of Chilton Si, MD.  I, Andrw Mcguirt C. Duke Salvia, MD have reviewed all documentation for this visit.  The documentation of the exam, diagnosis, procedures, and orders on 11/07/2022 are all accurate and complete.   Signed, Chilton Si, MD  11/07/2022 5:33 PM    [facility]

## 2022-11-07 NOTE — Assessment & Plan Note (Signed)
Currently maintaining sinus rhythm.  Continue metoprolol, dronedarone, and Xarelto.

## 2022-11-07 NOTE — Assessment & Plan Note (Addendum)
BP uncontrolled on multiple agents.  We will check renal artery Dopplers.  Stop losartan and HCTZ.  Switch to valsartan/HCTZ at 320/25mg  daily.  Add spironolactone 25mg  daily.  Check BMP in one week.    Screening for Secondary Hypertension:      11/07/2022    2:55 PM  Causes  Drugs/Herbals Screened     - Comments limits salt.  no caffeine/EtOH.  Renovascular HTN Screened  Sleep Apnea Screened  Thyroid Disease Screened  Hyperaldosteronism Screened     - Comments check renin/aldosterone  Pheochromocytoma N/A  Cushing's Syndrome N/A  Hyperparathyroidism Screened  Coarctation of the Aorta Screened     - Comments BP symmetric  Compliance Screened    Relevant Labs/Studies:    Latest Ref Rng & Units 04/16/2022    9:33 AM 04/13/2022    5:24 PM 03/15/2022    9:26 AM  Basic Labs  Sodium 135 - 145 mmol/L 140  139  140   Potassium 3.5 - 5.1 mmol/L 3.8  4.1  4.1   Creatinine 0.44 - 1.00 mg/dL 4.09  8.11  9.14        Latest Ref Rng & Units 07/29/2019    2:55 PM 10/17/2017    9:38 AM  Thyroid   TSH 0.450 - 4.500 uIU/mL 2.350  2.920                 11/07/2022    3:31 PM  Renovascular   Renal Artery [place] Completed Yes

## 2022-11-22 ENCOUNTER — Encounter: Payer: Self-pay | Admitting: Adult Health

## 2022-11-24 LAB — COMPREHENSIVE METABOLIC PANEL
ALT: 19 IU/L (ref 0–32)
AST: 21 IU/L (ref 0–40)
Albumin/Globulin Ratio: 1.6 (ref 1.2–2.2)
Albumin: 4.3 g/dL (ref 3.9–4.9)
Alkaline Phosphatase: 62 IU/L (ref 44–121)
BUN/Creatinine Ratio: 13 (ref 12–28)
BUN: 15 mg/dL (ref 8–27)
Bilirubin Total: 0.4 mg/dL (ref 0.0–1.2)
CO2: 26 mmol/L (ref 20–29)
Calcium: 9.9 mg/dL (ref 8.7–10.3)
Chloride: 104 mmol/L (ref 96–106)
Creatinine, Ser: 1.17 mg/dL — ABNORMAL HIGH (ref 0.57–1.00)
Globulin, Total: 2.7 g/dL (ref 1.5–4.5)
Glucose: 104 mg/dL — ABNORMAL HIGH (ref 70–99)
Potassium: 5.6 mmol/L — ABNORMAL HIGH (ref 3.5–5.2)
Sodium: 143 mmol/L (ref 134–144)
Total Protein: 7 g/dL (ref 6.0–8.5)
eGFR: 51 mL/min/{1.73_m2} — ABNORMAL LOW (ref >59)

## 2022-11-24 LAB — ALDOSTERONE + RENIN ACTIVITY W/ RATIO
Aldos/Renin Ratio: 0.5 (ref 0.0–30.0)
Aldosterone: 12 ng/dL (ref 0.0–30.0)
Renin Activity, Plasma: 22.277 ng/mL/hr — ABNORMAL HIGH (ref 0.167–5.380)

## 2022-11-24 LAB — MAGNESIUM: Magnesium: 2.2 mg/dL (ref 1.6–2.3)

## 2022-11-25 ENCOUNTER — Encounter: Payer: Self-pay | Admitting: Adult Health

## 2022-11-25 DIAGNOSIS — I63439 Cerebral infarction due to embolism of unspecified posterior cerebral artery: Secondary | ICD-10-CM

## 2022-11-25 DIAGNOSIS — M79609 Pain in unspecified limb: Secondary | ICD-10-CM

## 2022-11-25 DIAGNOSIS — R29898 Other symptoms and signs involving the musculoskeletal system: Secondary | ICD-10-CM

## 2022-11-25 DIAGNOSIS — I6381 Other cerebral infarction due to occlusion or stenosis of small artery: Secondary | ICD-10-CM

## 2022-11-28 MED ORDER — BACLOFEN 10 MG PO TABS: 10.0000 mg | ORAL_TABLET | Freq: Two times a day (BID) | ORAL | 1 refills | Status: DC

## 2022-12-10 ENCOUNTER — Encounter: Payer: Self-pay | Admitting: Adult Health

## 2022-12-12 ENCOUNTER — Ambulatory Visit (INDEPENDENT_AMBULATORY_CARE_PROVIDER_SITE_OTHER): Payer: Medicare Other

## 2022-12-12 DIAGNOSIS — I10 Essential (primary) hypertension: Secondary | ICD-10-CM

## 2022-12-13 ENCOUNTER — Telehealth: Payer: Self-pay | Admitting: Cardiovascular Disease

## 2022-12-13 DIAGNOSIS — Z5181 Encounter for therapeutic drug level monitoring: Secondary | ICD-10-CM

## 2022-12-13 DIAGNOSIS — E875 Hyperkalemia: Secondary | ICD-10-CM

## 2022-12-13 NOTE — Telephone Encounter (Signed)
Patient is returning call to discuss lab results. 

## 2022-12-13 NOTE — Telephone Encounter (Signed)
-----   Message from Chilton Si, MD sent at 12/13/2022 10:34 AM EDT ----- Labs are not consistent with hyperaldosteronism.  Renin is elevated due to valsartan.  No further work up needed.  Reduce spironolactone to 12.5mg  due to high potassium.

## 2022-12-13 NOTE — Telephone Encounter (Signed)
Advised patient of lab results and lab orders placed for recheck in about 1 week

## 2022-12-15 ENCOUNTER — Ambulatory Visit (HOSPITAL_BASED_OUTPATIENT_CLINIC_OR_DEPARTMENT_OTHER): Payer: Medicare Other | Admitting: Family

## 2022-12-27 ENCOUNTER — Other Ambulatory Visit: Payer: Self-pay | Admitting: Neurology

## 2022-12-27 MED ORDER — TOPIRAMATE 25 MG PO TABS: 25.0000 mg | ORAL_TABLET | Freq: Every day | ORAL | 2 refills | Status: DC

## 2022-12-27 NOTE — Telephone Encounter (Signed)
Pt called back, Asking if Topamax will be sent to pharmacy today.

## 2022-12-28 ENCOUNTER — Other Ambulatory Visit: Payer: Self-pay | Admitting: Neurology

## 2022-12-28 MED ORDER — TOPIRAMATE 25 MG PO TABS: ORAL_TABLET | ORAL | 4 refills | Status: DC

## 2022-12-29 ENCOUNTER — Telehealth: Payer: Self-pay | Admitting: Adult Health

## 2022-12-29 NOTE — Telephone Encounter (Signed)
Pt stated picked up topiramate (TOPAMAX) 25 MG tablet at pharmacy, instruction on the bottle was take one 1 tablet by mouth at bedtime. The direction are different than what the physician said. Please call back to verify

## 2022-12-29 NOTE — Telephone Encounter (Signed)
Called pt, informed her of message [facility] sent. Pt said okay I just want to make sure how to take this medication.

## 2022-12-29 NOTE — Telephone Encounter (Signed)
Please advise pt to take 25 mg twice daily for a week if not beneficial increase to 50 mg twice daily per Dr Pearlean Brownie

## 2023-01-30 ENCOUNTER — Encounter (HOSPITAL_COMMUNITY): Payer: Self-pay

## 2023-01-30 ENCOUNTER — Ambulatory Visit (HOSPITAL_BASED_OUTPATIENT_CLINIC_OR_DEPARTMENT_OTHER): Payer: Medicare Other | Admitting: Family

## 2023-02-01 ENCOUNTER — Other Ambulatory Visit (HOSPITAL_COMMUNITY): Payer: Self-pay | Admitting: *Deleted

## 2023-02-01 ENCOUNTER — Other Ambulatory Visit: Payer: Self-pay

## 2023-02-01 MED ORDER — RIVAROXABAN 20 MG PO TABS: 20.0000 mg | ORAL_TABLET | Freq: Every day | ORAL | 3 refills | Status: DC

## 2023-02-01 NOTE — Telephone Encounter (Signed)
Prescription refill request for Xarelto received.  Indication:afib Last office visit:4/24 Weight:97.3  kg Age:67 Scr:1.17  5/24 CrCl:72.65  ml/min  Prescription refilled

## 2023-02-02 ENCOUNTER — Ambulatory Visit (INDEPENDENT_AMBULATORY_CARE_PROVIDER_SITE_OTHER): Payer: Medicare Other | Admitting: Family

## 2023-02-02 ENCOUNTER — Encounter (HOSPITAL_BASED_OUTPATIENT_CLINIC_OR_DEPARTMENT_OTHER): Payer: Self-pay | Admitting: Family

## 2023-02-02 ENCOUNTER — Encounter (HOSPITAL_BASED_OUTPATIENT_CLINIC_OR_DEPARTMENT_OTHER): Payer: Self-pay

## 2023-02-02 VITALS — BP 127/81 | HR 62 | Ht 67.0 in | Wt 210.0 lb

## 2023-02-02 DIAGNOSIS — I10 Essential (primary) hypertension: Secondary | ICD-10-CM | POA: Diagnosis not present

## 2023-02-02 DIAGNOSIS — I480 Paroxysmal atrial fibrillation: Secondary | ICD-10-CM | POA: Diagnosis not present

## 2023-02-02 DIAGNOSIS — E782 Mixed hyperlipidemia: Secondary | ICD-10-CM | POA: Diagnosis not present

## 2023-02-02 DIAGNOSIS — D6859 Other primary thrombophilia: Secondary | ICD-10-CM

## 2023-02-02 DIAGNOSIS — Z8673 Personal history of transient ischemic attack (TIA), and cerebral infarction without residual deficits: Secondary | ICD-10-CM

## 2023-02-02 NOTE — Progress Notes (Signed)
Advanced [facility]pertension [facility] Assessment:    Date:  02/02/2023   ID:  Brandy Love, DOB [birth year], MRN [id number]  PCP:  Noberto Retort, MD  Cardiologist:  None  Nephrologist:  Referring MD: Noberto Retort, MD   CC: Hypertension  History of Present Illness:    Brandy Love is a 67 y.o. female with a hx of PAF, CVA (04/2022), HTN, asthma, gastric ulcer, GERD here to follow up  in the [facility].   Presented to the ED 01/27/2022 with palpitations diagnosed with atrial fibrillation started on metoprolol and OAC.  Subsequent monitor showed 2% atrial fibrillation burden and 1.8% SV ectopy.  Has since followed with atrial fibrillation clinic and been started on Multaq.  04/16/22 presented to ED with thalamic stroke started on aspirin in conjunction to Xarelto and statin.  Established with [facility] 11/07/22 with BP uncontrolled on multiple agents. Initially diagnosed with hypertension in 2011. At her visit, Losartan and hydrochlorothiazide were stopped and instead started on Valsartan/hydrochlorothiazide 320/25mg  daily as well as Spironolactone 25mg  daily. Renal duplex 12/12/22 with no stenosis. Renin-aldosterone not consistent with hyperaldosteronism.   Presents today for follow up. Pleasant lady who enjoys spending time with her husband and grandchildren. Notes she has a likely cyst on the back of her leg which is being drained today. She is presently on antibiotics. BP at home 109-113. Does note some lightheadedness particularly with position changes. Exercise limited  by orthopedic issues but has been achieving weight loss through dietary changes.   Previous antihypertensives: Amlodipine - hives, swelling   Past Medical History:  Diagnosis Date   A-fib (HCC)    Asthma    controlled well   Bladder prolapse, female, acquired    Edema extremities    lower legs at times   Gastric ulcer    GERD (gastroesophageal reflux disease)    Glaucoma     High blood pressure    OAB (overactive bladder)    Pre-diabetes    Prediabetes     Past Surgical History:  Procedure Laterality Date   ABDOMINAL HYSTERECTOMY  1993   ANTERIOR AND POSTERIOR REPAIR N/A 06/04/2019   Procedure: CYSTOSCOPY ANTERIOR (CYSTOCELE);  Surgeon: Alfredo Martinez, MD;  Location: [facility];  Service: Urology;  Laterality: N/A;   APPENDECTOMY     CATARACT EXTRACTION  2016   CYSTECTOMY     1994 and 1982   INTERSTIM IMPLANT PLACEMENT  04/10/2012   Procedure: Leane Platt IMPLANT FIRST STAGE;  Surgeon: Martina Sinner, MD;  Location: [facility];  Service: Urology;  Laterality: N/A;  rad tech ok per vicki at [facility]    INTERSTIM IMPLANT PLACEMENT  04/10/2012   Procedure: Leane Platt IMPLANT SECOND STAGE;  Surgeon: Martina Sinner, MD;  Location: [facility];  Service: Urology;  Laterality: N/A;   LOWER LEG SOFT TISSUE TUMOR EXCISION  1994   cyst left ankle -involved muscle removed-limited mobility now   NASAL SEPTUM SURGERY     PUBOVAGINAL SLING N/A 06/04/2019   Procedure: Leonides Grills;  Surgeon: Alfredo Martinez, MD;  Location: [facility];  Service: Urology;  Laterality: N/A;    Current Medications: Current Meds  Medication Sig   albuterol (PROVENTIL HFA;VENTOLIN HFA) 108 (90 Base) MCG/ACT inhaler Inhale 2 puffs into the lungs every 6 (six) hours as needed for wheezing or shortness of breath.   benzonatate (TESSALON) 200 MG capsule Take 200 mg by mouth as needed.   clotrimazole-betamethasone (LOTRISONE) cream Apply 1 Application topically  2 (two) times daily as needed (for irritation).   doxycycline (VIBRA-TABS) 100 MG tablet Take 100 mg by mouth 2 (two) times daily.   dronedarone (MULTAQ) 400 MG tablet Take 1 tablet (400 mg total) by mouth 2 (two) times daily with a meal.   fluticasone (FLOVENT HFA) 110 MCG/ACT inhaler Inhale 2 puffs into the lungs 2 (two) times daily as needed (for seasonal flares/asthma).   gabapentin (NEURONTIN) 100  MG capsule Take 200 mg by mouth 2 (two) times daily.   hyoscyamine (LEVSIN SL) 0.125 MG SL tablet Place 0.125 mg under the tongue 2 (two) times daily as needed for cramping.   metoprolol succinate (TOPROL XL) 25 MG 24 hr tablet Take 0.5 tablets (12.5 mg total) by mouth at bedtime. STOP lopressor   omeprazole (PRILOSEC) 40 MG capsule Take 40 mg by mouth daily before breakfast.   rivaroxaban (XARELTO) 20 MG TABS tablet Take 1 tablet (20 mg total) by mouth daily with supper.   sertraline (ZOLOFT) 100 MG tablet Take 200 mg by mouth daily.   sucralfate (CARAFATE) 1 g tablet Take 1 g by mouth daily as needed (to coat the stomach).   triamcinolone (KENALOG) 0.1 % paste 1 Application 2 (two) times daily as needed (for fever blisters- apply to the lips).   TYLENOL 500 MG tablet Take 500-1,000 mg by mouth every 6 (six) hours as needed for mild pain or headache.   valsartan-hydrochlorothiazide (DIOVAN-HCT) 320-25 MG tablet Take 1 tablet by mouth daily.   [DISCONTINUED] spironolactone (ALDACTONE) 25 MG tablet Take 12.5 mg by mouth daily.     Allergies:   Shellfish allergy, Sulfa antibiotics, Erythromycin, Ibuprofen, Adhesive [tape], Amlodipine, Betadine [povidone iodine], Povidone-iodine, and Silicone   Social History   Socioeconomic History   Marital status: Married    Spouse name: Chief Operating Officer   Number of children: 3   Years of education: Not on file   Highest education level: Not on file  Occupational History   Occupation: Engineer, building services    Occupation: Retired from [employer]  Tobacco Use   Smoking status: Never   Smokeless tobacco: Never  Vaping Use   Vaping status: Never Used  Substance and Sexual Activity   Alcohol use: No   Drug use: No   Sexual activity: Not on file  Other Topics Concern   Not on file  Social History Narrative   Not on file   Social Determinants of Health   Financial Resource Strain: Not on file  Food Insecurity: No Food Insecurity (11/07/2022)   Hunger  Vital Sign    Worried About Running Out of Food in the Last Year: Never true    Ran Out of Food in the Last Year: Never true  Transportation Needs: No Transportation Needs (11/07/2022)   PRAPARE - Administrator, Civil Service (Medical): No    Lack of Transportation (Non-Medical): No  Physical Activity: Inactive (11/07/2022)   Exercise Vital Sign    Days of Exercise per Week: 0 days    Minutes of Exercise per Session: 0 min  Stress: Not on file  Social Connections: Not on file     Family History: The patient's family history includes Asthma in her mother; Atrial fibrillation in her brother; Diabetes in her mother; Heart disease in her father; Hernia in her mother; Obesity in her mother; Prostate cancer in her father.  ROS:   Please see the history of present illness.    All other systems reviewed and are negative.  EKGs/Labs/Other Studies  Reviewed:    EKG:  EKG is not ordered today.   Recent Labs: 02/04/2022: B Natriuretic Peptide 50.7 04/16/2022: Hemoglobin 12.6; Platelets 181 11/17/2022: ALT 19; BUN 15; Creatinine, Ser 1.17; Magnesium 2.2; Potassium 5.6; Sodium 143   Recent Lipid Panel    Component Value Date/Time   CHOL 181 04/14/2022 2218   CHOL 189 07/29/2019 1455   TRIG 65 04/14/2022 2218   HDL 49 04/14/2022 2218   HDL 43 07/29/2019 1455   CHOLHDL 3.7 04/14/2022 2218   VLDL 13 04/14/2022 2218   LDLCALC 119 (H) 04/14/2022 2218   LDLCALC 125 (H) 07/29/2019 1455    Physical Exam:   VS:  BP 127/81   Pulse 62   Ht 5\' 7"  (1.702 m)   Wt 210 lb (95.3 kg)   BMI 32.89 kg/m  , BMI Body mass index is 32.89 kg/m. GENERAL:  Well appearing HEENT: Pupils equal round and reactive, fundi not visualized, oral mucosa unremarkable NECK:  No jugular venous distention, waveform within normal limits, carotid upstroke brisk and symmetric, no bruits, no thyromegaly LYMPHATICS:  No cervical adenopathy LUNGS:  Clear to auscultation bilaterally HEART:  RRR.  PMI not displaced  or sustained,S1 and S2 within normal limits, no S3, no S4, no clicks, no rubs, no murmurs ABD:  Flat, positive bowel sounds normal in frequency in pitch, no bruits, no rebound, no guarding, no midline pulsatile mass, no hepatomegaly, no splenomegaly EXT:  2 plus pulses throughout, no edema, no cyanosis no clubbing SKIN:  No rashes no nodules NEURO:  Cranial nerves II through XII grossly intact, motor grossly intact throughout PSYCH:  Cognitively intact, oriented to person place and time   ASSESSMENT/PLAN:    HTN - Now with hypotension at home. Stop Spironolactone. Continue valsartan-HCTZ 320-25 mg daily.  If BP remains low could consider reducing dose of valsartan-HCTZ.  Prior sleep study, renal duplex, renin-aldosterone unremarkable.   PAF / Hypercoagulable state -maintaining sinus rhythm by auscultation.  Follows with A-fib clinic.  Continue present dose Multaq, Toprol, Xarelto. CHA2DS2-VASc Score = 5 [CHF History: 0, HTN History: 1, Diabetes History: 0, Stroke History: 2, Vascular Disease History: 0, Age Score: 1, Gender Score: 1].  Therefore, the patient's annual risk of stroke is 7.2 %.    Denies bleeding complications.   History of CVA- Does not appear she is taking Atorvastatin as prescribed, noted after clinic visit. Will reach out via MyChart. Ideally LDL goal <55 given high risk (hx of CVA, age >50 yo, HTN).    Screening for Secondary Hypertension:     11/07/2022    2:55 PM  Causes  Drugs/Herbals Screened     - Comments limits salt.  no caffeine/EtOH.  Renovascular HTN Screened  Sleep Apnea Screened  Thyroid Disease Screened  Hyperaldosteronism Screened     - Comments check renin/aldosterone  Pheochromocytoma N/A  Cushing's Syndrome N/A  Hyperparathyroidism Screened  Coarctation of the Aorta Screened     - Comments BP symmetric  Compliance Screened    Relevant Labs/Studies:    Latest Ref Rng & Units 11/17/2022   11:04 AM 04/16/2022    9:33 AM 04/13/2022    5:24 PM   Basic Labs  Sodium 134 - 144 mmol/L 143  140  139   Potassium 3.5 - 5.2 mmol/L 5.6  3.8  4.1   Creatinine 0.57 - 1.00 mg/dL 3.08  6.57  8.46        Latest Ref Rng & Units 07/29/2019    2:55 PM 10/17/2017  9:38 AM  Thyroid   TSH 0.450 - 4.500 uIU/mL 2.350  2.920        Latest Ref Rng & Units 11/17/2022   11:04 AM  Renin/Aldosterone   Aldosterone 0.0 - 30.0 ng/dL 78.2   Aldos/Renin Ratio 0.0 - 30.0 0.5              12/12/2022   10:54 AM  Renovascular   Renal Artery [place] Completed Yes     Disposition:    FU with MD/PharmD in 2-3 months    Medication Adjustments/Labs and Tests Ordered: Current medicines are reviewed at length with the patient today.  Concerns regarding medicines are outlined above.  No orders of the defined types were placed in this encounter.  No orders of the defined types were placed in this encounter.    Signed, Alver Sorrow, NP  02/02/2023 9:12 AM    [facility]

## 2023-02-02 NOTE — Patient Instructions (Signed)
Medication Instructions:  STOP Spironolactone   Follow-Up: In 2 months with Hypertension Clinic    Special Instructions:  Keep up the good work checking your blood pressure at home!   We will check in after 2 weeks stopping the Spironolactone to be sure your blood pressure is less than 130/80 and you are not continuing to experience lightheadedness, dizziness.   To prevent lightheadedness, dizziness be sure to make position changes slowly, stay well hydrated, and eat regular meals.

## 2023-02-10 ENCOUNTER — Encounter (HOSPITAL_BASED_OUTPATIENT_CLINIC_OR_DEPARTMENT_OTHER): Payer: Self-pay

## 2023-02-14 ENCOUNTER — Emergency Department
Admission: EM | Admit: 2023-02-14 | Discharge: 2023-02-14 | Disposition: A | Discharge status: Home / Self Care | Payer: Medicare Other | Attending: Emergency Medicine | Admitting: Emergency Medicine

## 2023-02-14 ENCOUNTER — Other Ambulatory Visit: Payer: Self-pay

## 2023-02-14 DIAGNOSIS — K644 Residual hemorrhoidal skin tags: Secondary | ICD-10-CM | POA: Insufficient documentation

## 2023-02-14 DIAGNOSIS — K649 Unspecified hemorrhoids: Secondary | ICD-10-CM

## 2023-02-14 DIAGNOSIS — Z8673 Personal history of transient ischemic attack (TIA), and cerebral infarction without residual deficits: Secondary | ICD-10-CM | POA: Insufficient documentation

## 2023-02-14 DIAGNOSIS — K625 Hemorrhage of anus and rectum: Secondary | ICD-10-CM

## 2023-02-14 DIAGNOSIS — Z7901 Long term (current) use of anticoagulants: Secondary | ICD-10-CM | POA: Insufficient documentation

## 2023-02-14 DIAGNOSIS — I10 Essential (primary) hypertension: Secondary | ICD-10-CM | POA: Insufficient documentation

## 2023-02-14 LAB — COMPREHENSIVE METABOLIC PANEL
ALT: 20 U/L (ref 0–44)
AST: 19 U/L (ref 15–41)
Albumin: 3.5 g/dL (ref 3.5–5.0)
Alkaline Phosphatase: 47 U/L (ref 38–126)
Anion gap: 8 (ref 5–15)
BUN: 17 mg/dL (ref 8–23)
CO2: 29 mmol/L (ref 22–32)
Calcium: 8.6 mg/dL — ABNORMAL LOW (ref 8.9–10.3)
Chloride: 103 mmol/L (ref 98–111)
Creatinine, Ser: 1.06 mg/dL — ABNORMAL HIGH (ref 0.44–1.00)
GFR, Estimated: 58 mL/min — ABNORMAL LOW (ref >60)
Glucose, Bld: 107 mg/dL — ABNORMAL HIGH (ref 70–99)
Potassium: 3.6 mmol/L (ref 3.5–5.1)
Sodium: 140 mmol/L (ref 135–145)
Total Bilirubin: 0.6 mg/dL (ref 0.3–1.2)
Total Protein: 6.7 g/dL (ref 6.5–8.1)

## 2023-02-14 LAB — TYPE AND SCREEN
ABO/RH(D): A POS
Antibody Screen: NEGATIVE

## 2023-02-14 LAB — CBC
HCT: 37.8 % (ref 36.0–46.0)
Hemoglobin: 12.3 g/dL (ref 12.0–15.0)
MCH: 29.4 pg (ref 26.0–34.0)
MCHC: 32.5 g/dL (ref 30.0–36.0)
MCV: 90.4 fL (ref 80.0–100.0)
Platelets: 171 10*3/uL (ref 150–400)
RBC: 4.18 MIL/uL (ref 3.87–5.11)
RDW: 13.2 % (ref 11.5–15.5)
WBC: 6.3 10*3/uL (ref 4.0–10.5)
nRBC: 0 % (ref 0.0–0.2)

## 2023-02-14 MED ORDER — DOCUSATE SODIUM 100 MG PO CAPS: 100.0000 mg | ORAL_CAPSULE | Freq: Two times a day (BID) | ORAL | 1 refills | Status: DC

## 2023-02-14 MED ORDER — HYDROCORTISONE ACETATE 25 MG RE SUPP: 25.0000 mg | Freq: Two times a day (BID) | RECTAL | 1 refills | Status: DC

## 2023-02-14 NOTE — ED Provider Notes (Signed)
[facility] Provider Note    Event Date/Time   First MD Initiated Contact with Patient 02/14/23 1617     (approximate)   History   Chief Complaint Rectal Bleeding   HPI  Brandy Love is a 67 y.o. female with a past medical history of hypertension, atrial fibrillation on Xarelto, peptic ulcer disease, and stroke who presents to the ED complaining of rectal bleeding.  Patient reports that about 2 hours prior to arrival she went to have a bowel movement and passed bright red blood, also noticed bright red blood when she went to wipe.  She denies any dark or tarry stool and did not have any pain with this episode.  She has not noticed any bleeding since then, does take Xarelto daily.  She reports history of similar symptoms with bleeding hemorrhoids.     Physical Exam   Triage Vital Signs: ED Triage Vitals  Encounter Vitals Group     BP 02/14/23 1513 129/60     Systolic BP Percentile --      Diastolic BP Percentile --      Pulse Rate 02/14/23 1513 (!) 57     Resp 02/14/23 1513 18     Temp 02/14/23 1513 97.8 F (36.6 C)     Temp Source 02/14/23 1513 Oral     SpO2 02/14/23 1513 95 %     Weight 02/14/23 1512 209 lb 7 oz (95 kg)     Height 02/14/23 1512 5\' 7"  (1.702 m)     Head Circumference --      Peak Flow --      Pain Score 02/14/23 1512 0     Pain Loc --      Pain Education --      Exclude from Growth Chart --     Most recent vital signs: Vitals:   02/14/23 1513 02/14/23 1624  BP: 129/60 123/76  Pulse: (!) 57 (!) 56  Resp: 18 18  Temp: 97.8 F (36.6 C)   SpO2: 95% 100%    Constitutional: Alert and oriented. Eyes: Conjunctivae are normal. Head: Atraumatic. Nose: No congestion/rhinnorhea. Mouth/Throat: Mucous membranes are moist.  Cardiovascular: Normal rate, regular rhythm. Grossly normal heart sounds.  2+ radial pulses bilaterally. Respiratory: Normal respiratory effort.  No retractions. Lungs CTAB. Gastrointestinal: Soft and  nontender. No distention.  Rectal exam with nonbleeding hemorrhoids noted, stool is guaiac negative. Musculoskeletal: No lower extremity tenderness nor edema.  Neurologic:  Normal speech and language. No gross focal neurologic deficits are appreciated.    ED Results / Procedures / Treatments   Labs (all labs ordered are listed, but only abnormal results are displayed) Labs Reviewed  COMPREHENSIVE METABOLIC PANEL - Abnormal; Notable for the following components:      Result Value   Glucose, Bld 107 (*)    Creatinine, Ser 1.06 (*)    Calcium 8.6 (*)    GFR, Estimated 58 (*)    All other components within normal limits  CBC  POC OCCULT BLOOD, ED  TYPE AND SCREEN    PROCEDURES:  Critical Care performed: No  Procedures   MEDICATIONS ORDERED IN ED: Medications - No data to display   IMPRESSION / MDM / ASSESSMENT AND PLAN / ED COURSE  I reviewed the triage vital signs and the nursing notes.                              67 y.o.  female with past medical history of hypertension, atrial fibrillation on Xarelto, stroke, peptic ulcer disease, and GERD who presents to the ED complaining of episode of rectal bleeding about 2 hours prior to arrival.  Patient's presentation is most consistent with acute presentation with potential threat to life or bodily function.  Differential diagnosis includes, but is not limited to, bleeding hemorrhoids, anal fissure, anemia, lower GI bleed, upper GI bleed.  Patient well-appearing and in no acute distress, vital signs are unremarkable.  Her abdominal exam is benign, rectal exam with nonbleeding hemorrhoids and guaiac negative stool.  Labs are reassuring with no significant anemia, leukocytosis, tried abnormality, or AKI.  With reassuring workup and no active bleeding at this time, patient appropriate for outpatient management.  We will start her on Anusol and Colace, refer to general surgery for follow-up regarding suspected bleeding hemorrhoids.   She was counseled to return to the ED for new worsening symptoms, patient agrees with plan.      FINAL CLINICAL IMPRESSION(S) / ED DIAGNOSES   Final diagnoses:  Rectal bleeding  Hemorrhoids, unspecified hemorrhoid type     Rx / DC Orders   ED Discharge Orders          Ordered    hydrocortisone (ANUSOL-HC) 25 MG suppository  Every 12 hours        02/14/23 1659    docusate sodium (COLACE) 100 MG capsule  2 times daily        02/14/23 1659             Note:  This document was prepared using Dragon voice recognition software and may include unintentional dictation errors.   Chesley Noon, MD 02/14/23 [phone]

## 2023-02-14 NOTE — ED Triage Notes (Signed)
Pt to ed from home via POV for rectal bleeding that started today and was bright red. Pt denies any constipation. Pt  states "I figured it was a hemorrhoids but PCP said come here". Pt  is caox4, in no acute distress and ambulatory in triage.

## 2023-02-20 ENCOUNTER — Other Ambulatory Visit (HOSPITAL_COMMUNITY): Payer: Self-pay

## 2023-02-20 MED ORDER — METOPROLOL SUCCINATE ER 25 MG PO TB24: 12.5000 mg | ORAL_TABLET | Freq: Every day | ORAL | 1 refills | Status: DC

## 2023-02-27 ENCOUNTER — Ambulatory Visit (INDEPENDENT_AMBULATORY_CARE_PROVIDER_SITE_OTHER): Payer: Medicare Other | Admitting: Surgery

## 2023-02-27 ENCOUNTER — Encounter: Payer: Self-pay | Admitting: Surgery

## 2023-02-27 VITALS — BP 136/74 | HR 53 | Temp 98.0°F | Ht 67.0 in | Wt 211.0 lb

## 2023-02-27 DIAGNOSIS — K644 Residual hemorrhoidal skin tags: Secondary | ICD-10-CM

## 2023-02-27 DIAGNOSIS — K648 Other hemorrhoids: Secondary | ICD-10-CM

## 2023-02-27 NOTE — Patient Instructions (Addendum)
Continue using the suppositories to help reduce the inflammation.  We would like for you to start a Fiber supplement daily, like Metamucil or Benefiber(generic is fine) to help make your stools more formed but soft.  Be sure to drink plenty of fluids to help the fiber work better.   May use Imodium as needed for multiple days of loose stools or diarrhea.   Follow up here in 1 month.   Hemorrhoids Hemorrhoids are swollen veins that may form: In the butt (rectum). These are called internal hemorrhoids. Around the opening of the butt (anus). These are called external hemorrhoids. Most hemorrhoids do not cause very bad problems. They often get better with changes to your lifestyle and what you eat. What are the causes? Having trouble pooping (constipation) or watery poop (diarrhea). Pushing too hard when you poop. Pregnancy. Being very overweight (obese). Sitting for too long. Riding a bike for a long time. Heavy lifting or other things that take a lot of effort. Anal sex. What are the signs or symptoms? Pain. Itching or soreness in the butt. Bleeding from the butt. Leaking poop. Swelling. One or more lumps around the opening of your butt. How is this treated? In most cases, hemorrhoids can be treated at home. You may be told to: Change what you eat. Make changes to your lifestyle. If these treatments do not help, you may need to have a procedure done. Your doctor may need to: Place rubber bands at the bottom of the hemorrhoids to make them fall off. Put medicine into the hemorrhoids to shrink them. Shine a type of light on the hemorrhoids to cause them to fall off. Do surgery to get rid of the hemorrhoids. Follow these instructions at home: Medicines Take over-the-counter and prescription medicines only as told by your doctor. Use creams with medicine in them or medicines that you put in your butt as told by your doctor. Eating and drinking  Eat foods that have a lot of  fiber in them. These include whole grains, beans, nuts, fruits, and vegetables. Ask your doctor about taking products that have fiber added to them (fibersupplements). Take in less fat. You can do this by: Eating low-fat dairy products. Eating less red meat. Staying away from processed foods. Drink enough fluid to keep your pee (urine) pale yellow. Managing pain and swelling  Take a warm-water bath (sitz bath) for 20 minutes to ease pain. Do this 3-4 times a day. You may do this in a bathtub. You may also use a portable sitz bath that fits over the toilet. If told, put ice on the painful area. It may help to use ice between your warm baths. Put ice in a plastic bag. Place a towel between your skin and the bag. Leave the ice on for 20 minutes, 2-3 times a day. If your skin turns bright red, take off the ice right away to prevent skin damage. The risk of damage is higher if you cannot feel pain, heat, or cold. General instructions Exercise. Ask your doctor how much and what kind of exercise is best for you. Go to the bathroom when you need to poop. Do not wait. Try not to push too hard when you poop. Keep your butt dry and clean. Use wet toilet paper or moist towelettes after you poop. Do not sit on the toilet for a long time. Contact a doctor if: You have pain and swelling that do not get better with treatment. You have trouble pooping. You cannot  poop. You have pain or swelling outside the area of the hemorrhoids. Get help right away if: You have bleeding from the butt that will not stop. This information is not intended to replace advice given to you by your health care provider. Make sure you discuss any questions you have with your health care provider. Document Revised: 03/09/2022 Document Reviewed: 03/09/2022 Elsevier Patient Education  2024 [employer].

## 2023-02-27 NOTE — Progress Notes (Signed)
02/27/2023  Reason for Visit:  Bleeding internal hemorrhoids  History of Present Illness: Brandy Love is a 67 y.o. female presenting for evaluation of bleeding internal hemorrhoids.  The patient reports that she has had a history of hemorrhoid issues for at least 30 years.  She has noticed some mild blood on the toilet paper when she is wiping or sometimes a little bit in the toilet bowl.  However on 02/14/2023 she presented to the emergency room with more severe bleeding per rectum.  Once she was evaluated in the emergency room, the bleeding had subsided.  The patient is on Xarelto chronically for atrial fibrillation and due to the amount of bleeding, she thought it be prudent to be evaluated.  Since her visit in the emergency room, she has not noticed any further bleeding over the last 2 weeks.  Patient denies any issues with constipation and actually reports having at least 5-6 bowel movements per day which can be very mushy to diarrhea.  She reports that if she eats something about 30 minutes later she needs to go to the bathroom.  Denies any straining while having bowel movements and denies any prolonged time on the toilet bowl.  She has been using the Anusol suppositories given by the emergency room.  Has any perianal pain, does report itching in the perianal area and is using barrier ointment to help with some of the excoriation of the skin.  Past Medical History: Past Medical History:  Diagnosis Date   A-fib (HCC)    Asthma    controlled well   Bladder prolapse, female, acquired    Edema extremities    lower legs at times   Gastric ulcer    GERD (gastroesophageal reflux disease)    Glaucoma    High blood pressure    OAB (overactive bladder)    Pre-diabetes    Prediabetes      Past Surgical History: Past Surgical History:  Procedure Laterality Date   ABDOMINAL HYSTERECTOMY  1993   ANTERIOR AND POSTERIOR REPAIR N/A 06/04/2019   Procedure: CYSTOSCOPY ANTERIOR (CYSTOCELE);   Surgeon: Alfredo Martinez, MD;  Location: [facility];  Service: Urology;  Laterality: N/A;   APPENDECTOMY     CATARACT EXTRACTION  2016   CYSTECTOMY     1994 and 1982   INTERSTIM IMPLANT PLACEMENT  04/10/2012   Procedure: Leane Platt IMPLANT FIRST STAGE;  Surgeon: Martina Sinner, MD;  Location: [facility];  Service: Urology;  Laterality: N/A;  rad tech ok per vicki at main    INTERSTIM IMPLANT PLACEMENT  04/10/2012   Procedure: Leane Platt IMPLANT SECOND STAGE;  Surgeon: Martina Sinner, MD;  Location: [facility];  Service: Urology;  Laterality: N/A;   LOWER LEG SOFT TISSUE TUMOR EXCISION  1994   cyst left ankle -involved muscle removed-limited mobility now   NASAL SEPTUM SURGERY     PUBOVAGINAL SLING N/A 06/04/2019   Procedure: Leonides Grills;  Surgeon: Alfredo Martinez, MD;  Location: [facility];  Service: Urology;  Laterality: N/A;    Home Medications: Prior to Admission medications   Medication Sig Start Date End Date Taking? Authorizing Provider  albuterol (PROVENTIL HFA;VENTOLIN HFA) 108 (90 Base) MCG/ACT inhaler Inhale 2 puffs into the lungs every 6 (six) hours as needed for wheezing or shortness of breath.   Yes [provider]  benzonatate (TESSALON) 200 MG capsule Take 200 mg by mouth as needed. 04/18/22  Yes [provider]  clotrimazole-betamethasone (LOTRISONE) cream Apply 1 Application  topically 2 (two) times daily as needed (for irritation). 12/15/21  Yes [provider]  docusate sodium (COLACE) 100 MG capsule Take 1 capsule (100 mg total) by mouth 2 (two) times daily. 02/14/23 04/15/23 Yes Chesley Noon, MD  dronedarone (MULTAQ) 400 MG tablet Take 1 tablet (400 mg total) by mouth 2 (two) times daily with a meal. 03/17/22  Yes Newman Nip, NP  fluticasone (FLOVENT HFA) 110 MCG/ACT inhaler Inhale 2 puffs into the lungs 2 (two) times daily as needed (for seasonal flares/asthma).   Yes [provider]  gabapentin  (NEURONTIN) 100 MG capsule Take 200 mg by mouth 2 (two) times daily. 01/18/23  Yes [provider]  hydrocortisone (ANUSOL-HC) 25 MG suppository Place 1 suppository (25 mg total) rectally every 12 (twelve) hours. 02/14/23 02/14/24 Yes Chesley Noon, MD  hyoscyamine (LEVSIN SL) 0.125 MG SL tablet Place 0.125 mg under the tongue 2 (two) times daily as needed for cramping. 11/17/21  Yes [provider]  metoprolol succinate (TOPROL XL) 25 MG 24 hr tablet Take 0.5 tablets (12.5 mg total) by mouth at bedtime. STOP lopressor 02/20/23 02/20/24 Yes Fenton, Clint R, PA  omeprazole (PRILOSEC) 40 MG capsule Take 40 mg by mouth daily before breakfast.   Yes [provider]  rivaroxaban (XARELTO) 20 MG TABS tablet Take 1 tablet (20 mg total) by mouth daily with supper. 02/01/23  Yes Chilton Si, MD  sertraline (ZOLOFT) 100 MG tablet Take 200 mg by mouth daily. 04/29/22  Yes [provider]  sucralfate (CARAFATE) 1 g tablet Take 1 g by mouth daily as needed (to coat the stomach). 11/16/21  Yes [provider]  triamcinolone (KENALOG) 0.1 % paste 1 Application 2 (two) times daily as needed (for fever blisters- apply to the lips).   Yes [provider]  TYLENOL 500 MG tablet Take 500-1,000 mg by mouth every 6 (six) hours as needed for mild pain or headache.   Yes [provider]  valsartan-hydrochlorothiazide (DIOVAN-HCT) 320-25 MG tablet Take 1 tablet by mouth daily. 11/07/22  Yes Chilton Si, MD    Allergies: Allergies  Allergen Reactions   Shellfish Allergy Anaphylaxis, Nausea Only, Swelling and Other (See Comments)    Swelling of the throat   Sulfa Antibiotics Hives   Erythromycin Hives   Ibuprofen Other (See Comments)    Vertigo   Adhesive [Tape] Rash and Other (See Comments)    Rash with transderm   Amlodipine Hives, Swelling and Rash   Betadine [Povidone Iodine] Rash and Other (See Comments)    ALL TOPICAL IODINES   Povidone-Iodine  Rash and Other (See Comments)    ALL TOPICAL IODINES   Silicone Rash and Other (See Comments)    Rash with transderm    Social History:  reports that she has never smoked. She has never been exposed to tobacco smoke. She has never used smokeless tobacco. She reports that she does not drink alcohol and does not use drugs.   Family History: Family History  Problem Relation Age of Onset   Asthma Mother    Diabetes Mother    Hernia Mother    Obesity Mother    Heart disease Father    Prostate cancer Father    Atrial fibrillation Brother     Review of Systems: Review of Systems  Constitutional:  Negative for chills and fever.  Respiratory:  Negative for shortness of breath.   Cardiovascular:  Negative for chest pain.  Gastrointestinal:  Positive for blood in stool and  diarrhea. Negative for nausea and vomiting.    Physical Exam BP 136/74   Pulse (!) 53   Temp 98 F (36.7 C)   Ht 5\' 7"  (1.702 m)   Wt 211 lb (95.7 kg)   SpO2 97%   BMI 33.05 kg/m  CONSTITUTIONAL: No acute distress, well-nourished HEENT:  Normocephalic, atraumatic, extraocular motion intact.  RESPIRATORY: Normal respiratory effort without pathologic use of accessory muscles. CARDIOVASCULAR: Regular rhythm and rate. RECTAL: External exam reveals mildly enlarged external hemorrhoids with the left lateral being the largest, followed by right anterior, followed by right posterior.  There is no inflammatory changes externally.  On digital rectal exam, the left lateral and right anterior components are enlarged internally with some irritation at the very anterior portion of the anal opening but without any gross bleeding. MUSCULOSKELETAL:  Normal muscle strength and tone in all four extremities.  No peripheral edema or cyanosis. NEUROLOGIC:  Motor and sensation is grossly normal.  Cranial nerves are grossly intact. PSYCH:  Alert and oriented to person, place and time. Affect is normal.  Laboratory Analysis: Labs from  02/14/2023: Sodium 140, potassium 3.6, chloride 103, CO2 29, BUN 17, creatinine 1.06.  LFTs within normal limits.  WBC 6.3, hemoglobin 12.3, hematocrit 37.8, platelets 171.  Imaging: No results found.  Assessment and Plan: This is a 67 y.o. female with bleeding internal hemorrhoids.  - Discussed with patient that constipation is the most common etiology for hemorrhoid issues.  However in her case, I think it is actually the diarrhea and such frequent bowel movements per day that are causing more of the irritation to the internal hemorrhoid tissues.  I think the Anusol suppositories have been helping decrease some of the inflammation and currently there is no further bleeding.  However discussed with the patient that we need to help with her bowel habits and consistency in order to help decrease some of the irritation to the hemorrhoids. - Particularly, discussed with her starting to add fiber supplements to her diet either once daily or twice daily so it can absorb more of the fluid from the diarrhea and try to make the stool a little bit mushy or bulkier.  She can also add Imodium to help slow down her intestines to allow for more fluid reabsorption.  Discussed that she should do this slowly so to avoid the other extreme of constipation.  She can continue using the suppositories for now until the course is completed.  Also discussed doing Sitz baths to help soothe the excoriated tissue, and she can also apply barrier ointment.  Discussed only sitting on the toilet bowl for the amount needed to have a bowel movement and now prolonging the amount of time gravity can pull on the hemorrhoid tissue.   -For now, we will try to avoid any surgical intervention and see if this measures alone are able to help.  Discussed with her that sometimes despite these measures, people still have bleeding episodes particular given that she is on anticoagulation.  If that remains the case, then we can talk about surgery in the  future if needed. - Follow-up in 1 month to reassess her progress.  I spent 30 minutes dedicated to the care of this patient on the date of this encounter to include pre-visit review of records, face-to-face time with the patient discussing diagnosis and management, and any post-visit coordination of care.   Howie Ill, MD [facility]

## 2023-03-30 ENCOUNTER — Encounter (HOSPITAL_BASED_OUTPATIENT_CLINIC_OR_DEPARTMENT_OTHER): Payer: Self-pay | Admitting: Family

## 2023-03-30 ENCOUNTER — Ambulatory Visit (INDEPENDENT_AMBULATORY_CARE_PROVIDER_SITE_OTHER): Payer: Medicare Other | Admitting: Family

## 2023-03-30 VITALS — BP 130/68 | HR 54 | Ht 67.0 in | Wt 215.9 lb

## 2023-03-30 DIAGNOSIS — D6859 Other primary thrombophilia: Secondary | ICD-10-CM

## 2023-03-30 DIAGNOSIS — I480 Paroxysmal atrial fibrillation: Secondary | ICD-10-CM

## 2023-03-30 DIAGNOSIS — E782 Mixed hyperlipidemia: Secondary | ICD-10-CM

## 2023-03-30 DIAGNOSIS — Z8673 Personal history of transient ischemic attack (TIA), and cerebral infarction without residual deficits: Secondary | ICD-10-CM

## 2023-03-30 DIAGNOSIS — I10 Essential (primary) hypertension: Secondary | ICD-10-CM | POA: Diagnosis not present

## 2023-03-30 MED ORDER — ATORVASTATIN CALCIUM 40 MG PO TABS: 40.0000 mg | ORAL_TABLET | Freq: Every day | ORAL | 3 refills | Status: DC

## 2023-03-30 NOTE — Patient Instructions (Addendum)
Medication Instructions:  START Atorvastatin 40mg  daily   Labwork: Your physician recommends that you return for lab work in 3 months for fasting lipid panel and LFTs, about a week prior to follow up   Please return for Lab work. You may come to the...   [facility] (3rd floor) [address] [place], [place] [address]  Open: 8am-Noon and 1pm-4:30pm  Please ring the doorbell on the small table when you exit the elevator and the Lab Tech will come get you  [facility] [address], [place], [place] [address] Open: 8am-1pm, then 2pm-4:30pm   [facility]- Please see attached locations sheet stapled to your lab work with address and hours.    Follow-Up: 06/29/2023 at 8:25 am with Ronn Melena NP

## 2023-03-30 NOTE — Progress Notes (Signed)
Advanced Hypertension Clinic Assessment:    Date:  03/30/2023   ID:  Brandy Love, DOB 08/28/1955, MRN [id number]  PCP:  Noberto Retort, MD  Cardiologist:  None  Nephrologist:  Referring MD: Noberto Retort, MD   CC: Hypertension  History of Present Illness:    Brandy Love is a 67 y.o. female with a hx of PAF, CVA (04/2022), HTN, asthma, gastric ulcer, GERD here to follow up  in the Advanced Hypertension Clinic.   Presented to the ED 01/27/2022 with palpitations diagnosed with atrial fibrillation started on metoprolol and OAC.  Subsequent monitor showed 2% atrial fibrillation burden and 1.8% SV ectopy.  Has since followed with atrial fibrillation clinic and been started on Multaq.  04/16/22 presented to ED with thalamic stroke started on aspirin in conjunction to Xarelto and statin.  Established with [facility] 11/07/22 with BP uncontrolled on multiple agents. Initially diagnosed with hypertension in 2011. At her visit, Losartan and hydrochlorothiazide were stopped and instead started on Valsartan/hydrochlorothiazide 320/25mg  daily as well as Spironolactone 25mg  daily. Renal duplex 12/12/22 with no stenosis. Renin-aldosterone not consistent with hyperaldosteronism.   She was last seen 02/02/2023 with home systolic BP 109-113 with occasional lightheadedness with position changes.  Spironolactone was discontinued.  She was recommended to continue valsartan-HCTZ 320-25 mg daily.  ED visit 02/14/2023 with GI bleeding due to hemorrhoids.  She saw general surgery and was advised for medication to help manage constipation/diarrhea which were felt to be exacerbating her hemorrhoidal bleeding.  Presents today for follow up. Pleasant lady who enjoys spending time with her husband and grandchildren. Exercisign by walking her dog. Continues to have issues with her right knee and is awaiting insurance approval for a new injection. Average BP over last 6 home readings: 135/70. Has  more difficulties with asthma in the fall so some dyspnea. Notes no recurrent hypotension nor lightheadedness. Has not been taking Atorvastatin, no intolerance but ran out of medication.  Reports no recurrent GI bleeding.  Previous antihypertensives: Amlodipine - hives, swelling   Past Medical History:  Diagnosis Date   A-fib (HCC)    Asthma    controlled well   Bladder prolapse, female, acquired    Edema extremities    lower legs at times   Gastric ulcer    GERD (gastroesophageal reflux disease)    Glaucoma    High blood pressure    OAB (overactive bladder)    Pre-diabetes    Prediabetes     Past Surgical History:  Procedure Laterality Date   ABDOMINAL HYSTERECTOMY  1993   ANTERIOR AND POSTERIOR REPAIR N/A 06/04/2019   Procedure: CYSTOSCOPY ANTERIOR (CYSTOCELE);  Surgeon: Alfredo Martinez, MD;  Location: [facility];  Service: Urology;  Laterality: N/A;   APPENDECTOMY     CATARACT EXTRACTION  2016   CYSTECTOMY     1994 and 1982   INTERSTIM IMPLANT PLACEMENT  04/10/2012   Procedure: Leane Platt IMPLANT FIRST STAGE;  Surgeon: Martina Sinner, MD;  Location: [facility];  Service: Urology;  Laterality: N/A;  rad tech ok per vicki at main    INTERSTIM IMPLANT PLACEMENT  04/10/2012   Procedure: Leane Platt IMPLANT SECOND STAGE;  Surgeon: Martina Sinner, MD;  Location: [facility];  Service: Urology;  Laterality: N/A;   LOWER LEG SOFT TISSUE TUMOR EXCISION  1994   cyst left ankle -involved muscle removed-limited mobility now   NASAL SEPTUM SURGERY     PUBOVAGINAL SLING N/A 06/04/2019   Procedure:  PUBO-VAGINAL SLING;  Surgeon: Alfredo Martinez, MD;  Location: [facility];  Service: Urology;  Laterality: N/A;    Current Medications: Current Meds  Medication Sig   acetaminophen (TYLENOL) 650 MG CR tablet Take 1,300 mg by mouth in the morning and at bedtime.   albuterol (PROVENTIL HFA;VENTOLIN HFA) 108 (90 Base) MCG/ACT inhaler Inhale 2 puffs into the  lungs every 6 (six) hours as needed for wheezing or shortness of breath.   atorvastatin (LIPITOR) 40 MG tablet Take 1 tablet (40 mg total) by mouth daily.   benzonatate (TESSALON) 200 MG capsule Take 200 mg by mouth as needed.   clotrimazole-betamethasone (LOTRISONE) cream Apply 1 Application topically 2 (two) times daily as needed (for irritation).   docusate sodium (COLACE) 100 MG capsule Take 100 mg by mouth as needed for mild constipation.   dronedarone (MULTAQ) 400 MG tablet Take 1 tablet (400 mg total) by mouth 2 (two) times daily with a meal.   fluticasone (FLOVENT HFA) 110 MCG/ACT inhaler Inhale 2 puffs into the lungs 2 (two) times daily as needed (for seasonal flares/asthma).   gabapentin (NEURONTIN) 300 MG capsule Take 300 mg in the morning and 600 mg at night   hydrocortisone (ANUSOL-HC) 25 MG suppository Place 25 mg rectally as needed for hemorrhoids or anal itching.   hyoscyamine (LEVSIN SL) 0.125 MG SL tablet Place 0.125 mg under the tongue 2 (two) times daily as needed for cramping.   metoprolol succinate (TOPROL XL) 25 MG 24 hr tablet Take 0.5 tablets (12.5 mg total) by mouth at bedtime. STOP lopressor   omeprazole (PRILOSEC) 40 MG capsule Take 40 mg by mouth daily before breakfast.   rivaroxaban (XARELTO) 20 MG TABS tablet Take 1 tablet (20 mg total) by mouth daily with supper.   sertraline (ZOLOFT) 100 MG tablet Take 200 mg by mouth daily.   sucralfate (CARAFATE) 1 g tablet Take 1 g by mouth daily as needed (to coat the stomach).   triamcinolone (KENALOG) 0.1 % paste 1 Application 2 (two) times daily as needed (for fever blisters- apply to the lips).   valsartan-hydrochlorothiazide (DIOVAN-HCT) 320-25 MG tablet Take 1 tablet by mouth daily.   [DISCONTINUED] hydrocortisone (ANUSOL-HC) 25 MG suppository Place 1 suppository (25 mg total) rectally every 12 (twelve) hours. (Patient taking differently: Place 25 mg rectally as needed for anal itching or hemorrhoids.)     Allergies:    Shellfish allergy, Sulfa antibiotics, Erythromycin, Ibuprofen, Adhesive [tape], Amlodipine, Betadine [povidone iodine], Povidone-iodine, and Silicone   Social History   Socioeconomic History   Marital status: Married    Spouse name: Chief Operating Officer   Number of children: 3   Years of education: Not on file   Highest education level: Not on file  Occupational History   Occupation: Engineer, building services    Occupation: Retired from [employer]  Tobacco Use   Smoking status: Never    Passive exposure: Never   Smokeless tobacco: Never  Vaping Use   Vaping status: Never Used  Substance and Sexual Activity   Alcohol use: No   Drug use: No   Sexual activity: Not on file  Other Topics Concern   Not on file  Social History Narrative   Not on file   Social Determinants of Health   Financial Resource Strain: Not on file  Food Insecurity: No Food Insecurity (11/07/2022)   Hunger Vital Sign    Worried About Running Out of Food in the Last Year: Never true    Ran Out of Food in the  Last Year: Never true  Transportation Needs: No Transportation Needs (11/07/2022)   PRAPARE - Administrator, Civil Service (Medical): No    Lack of Transportation (Non-Medical): No  Physical Activity: Inactive (11/07/2022)   Exercise Vital Sign    Days of Exercise per Week: 0 days    Minutes of Exercise per Session: 0 min  Stress: Not on file  Social Connections: Not on file     Family History: The patient's family history includes Asthma in her mother; Atrial fibrillation in her brother; Diabetes in her mother; Heart disease in her father; Hernia in her mother; Obesity in her mother; Prostate cancer in her father.  ROS:   Please see the history of present illness.    All other systems reviewed and are negative.  EKGs/Labs/Other Studies Reviewed:         Recent Labs: 11/17/2022: Magnesium 2.2 02/14/2023: ALT 20; BUN 17; Creatinine, Ser 1.06; Hemoglobin 12.3; Platelets 171; Potassium 3.6; Sodium  140   Recent Lipid Panel    Component Value Date/Time   CHOL 181 04/14/2022 2218   CHOL 189 07/29/2019 1455   TRIG 65 04/14/2022 2218   HDL 49 04/14/2022 2218   HDL 43 07/29/2019 1455   CHOLHDL 3.7 04/14/2022 2218   VLDL 13 04/14/2022 2218   LDLCALC 119 (H) 04/14/2022 2218   LDLCALC 125 (H) 07/29/2019 1455    Physical Exam:   VS:  BP 130/68   Pulse (!) 54   Ht 5\' 7"  (1.702 m)   Wt 215 lb 14.4 oz (97.9 kg)   SpO2 96%   BMI 33.81 kg/m  , BMI Body mass index is 33.81 kg/m. GENERAL:  Well appearing HEENT: Pupils equal round and reactive, fundi not visualized, oral mucosa unremarkable NECK:  No jugular venous distention, waveform within normal limits, carotid upstroke brisk and symmetric, no bruits, no thyromegaly LYMPHATICS:  No cervical adenopathy LUNGS:  Clear to auscultation bilaterally HEART:  RRR.  PMI not displaced or sustained,S1 and S2 within normal limits, no S3, no S4, no clicks, no rubs, no murmurs ABD:  Flat, positive bowel sounds normal in frequency in pitch, no bruits, no rebound, no guarding, no midline pulsatile mass, no hepatomegaly, no splenomegaly EXT:  2 plus pulses throughout, no edema, no cyanosis no clubbing SKIN:  No rashes no nodules NEURO:  Cranial nerves II through XII grossly intact, motor grossly intact throughout PSYCH:  Cognitively intact, oriented to person place and time   ASSESSMENT/PLAN:    HTN -BP minimally elevated 132/76 in clinic today.  Checking at home with average home readings 135/70.  Discussed waiting until December after medications to check at home.  Due to prior lightheadedness with relative hypotension we will continue present antihypertensive regimen valsartan-HCTZ 320-25 mg daily, Toprol 12.5 mg nightly. Discussed to monitor BP at home at least 2 hours after medications and sitting for 5-10 minutes.   PAF / Hypercoagulable state - Maintaining NSR by auscultation. CHA2DS2-VASc Score = 5 [CHF History: 0, HTN History: 1, Diabetes  History: 0, Stroke History: 2, Vascular Disease History: 0, Age Score: 1, Gender Score: 1].  Therefore, the patient's annual risk of stroke is 7.2 %.    Continue Xarelto 20mg  daily. Continue Multaq 400mg  BID, Toprol 12.5mg  at bedtime.  We did discuss Watchman if she has further issues with GI bleeding. As issues presently resolved she elects not to proceed.  History of CVA / HLD, LDL goal <55 -not presently taking atorvastatin.  Will reduce at lower  dose of 40 mg daily.  Update FLP/LFT in 3 months.  LDL goal less than 55 given history of stroke, age, hypertension.   Screening for Secondary Hypertension:     11/07/2022    2:55 PM  Causes  Drugs/Herbals Screened     - Comments limits salt.  no caffeine/EtOH.  Renovascular HTN Screened  Sleep Apnea Screened  Thyroid Disease Screened  Hyperaldosteronism Screened     - Comments check renin/aldosterone  Pheochromocytoma N/A  Cushing's Syndrome N/A  Hyperparathyroidism Screened  Coarctation of the Aorta Screened     - Comments BP symmetric  Compliance Screened    Relevant Labs/Studies:    Latest Ref Rng & Units 02/14/2023    3:15 PM 11/17/2022   11:04 AM 04/16/2022    9:33 AM  Basic Labs  Sodium 135 - 145 mmol/L 140  143  140   Potassium 3.5 - 5.1 mmol/L 3.6  5.6  3.8   Creatinine 0.44 - 1.00 mg/dL 1.61  0.96  0.45        Latest Ref Rng & Units 07/29/2019    2:55 PM 10/17/2017    9:38 AM  Thyroid   TSH 0.450 - 4.500 uIU/mL 2.350  2.920        Latest Ref Rng & Units 11/17/2022   11:04 AM  Renin/Aldosterone   Aldosterone 0.0 - 30.0 ng/dL 40.9   Aldos/Renin Ratio 0.0 - 30.0 0.5              12/12/2022   10:54 AM  Renovascular   Renal Artery [place] Completed Yes     Disposition:    FU with MD/PharmD in 6 months    Medication Adjustments/Labs and Tests Ordered: Current medicines are reviewed at length with the patient today.  Concerns regarding medicines are outlined above.  Orders Placed This Encounter  Procedures   Lipid panel    Hepatic function panel   Meds ordered this encounter  Medications   atorvastatin (LIPITOR) 40 MG tablet    Sig: Take 1 tablet (40 mg total) by mouth daily.    Dispense:  90 tablet    Refill:  3    Order Specific Question:   Supervising Provider    Answer:   Jodelle Red [[id number]]     Signed, Alver Sorrow, NP  03/30/2023 10:33 AM    [facility]

## 2023-04-10 ENCOUNTER — Ambulatory Visit: Payer: Medicare Other | Admitting: Surgery

## 2023-04-21 ENCOUNTER — Encounter: Payer: Self-pay | Admitting: Surgery

## 2023-04-21 ENCOUNTER — Other Ambulatory Visit: Payer: Self-pay

## 2023-04-21 ENCOUNTER — Ambulatory Visit: Payer: Medicare Other | Admitting: Surgery

## 2023-04-21 VITALS — BP 122/75 | HR 52 | Ht 67.0 in | Wt 215.4 lb

## 2023-04-21 DIAGNOSIS — K648 Other hemorrhoids: Secondary | ICD-10-CM | POA: Diagnosis not present

## 2023-04-21 DIAGNOSIS — R1011 Right upper quadrant pain: Secondary | ICD-10-CM | POA: Diagnosis not present

## 2023-04-21 NOTE — Progress Notes (Signed)
04/21/2023  History of Present Illness: Brandy Love is a 67 y.o. female presenting for follow-up of bleeding internal hemorrhoids.  The patient was last seen on 02/27/2023 for this.  She had been to the emergency room on 02/14/2023 with more significant rectal bleeding than usual.  However since her emergency room visit, she has not had any bleeding issues.  Particularly since her last visit with me, she has not had any bleeding issues.  She has been taking additional fiber and this has helped with her diarrhea.  Denies any perianal pain.  Of note, she does report that she has been having some discomfort in the right upper quadrant and epigastric areas.  She has had prior workup in the past with right upper quadrant ultrasound and HIDA scan in 2018 which did not show any cholelithiasis or biliary dyskinesia.  Past Medical History: Past Medical History:  Diagnosis Date   A-fib (HCC)    Asthma    controlled well   Bladder prolapse, female, acquired    Edema extremities    lower legs at times   Gastric ulcer    GERD (gastroesophageal reflux disease)    Glaucoma    High blood pressure    OAB (overactive bladder)    Pre-diabetes    Prediabetes      Past Surgical History: Past Surgical History:  Procedure Laterality Date   ABDOMINAL HYSTERECTOMY  1993   ANTERIOR AND POSTERIOR REPAIR N/A 06/04/2019   Procedure: CYSTOSCOPY ANTERIOR (CYSTOCELE);  Surgeon: Alfredo Martinez, MD;  Location: [facility];  Service: Urology;  Laterality: N/A;   APPENDECTOMY     CATARACT EXTRACTION  2016   CYSTECTOMY     1994 and 1982   INTERSTIM IMPLANT PLACEMENT  04/10/2012   Procedure: Leane Platt IMPLANT FIRST STAGE;  Surgeon: Martina Sinner, MD;  Location: [facility];  Service: Urology;  Laterality: N/A;  rad tech ok per vicki at main    INTERSTIM IMPLANT PLACEMENT  04/10/2012   Procedure: Leane Platt IMPLANT SECOND STAGE;  Surgeon: Martina Sinner, MD;  Location: [facility];   Service: Urology;  Laterality: N/A;   LOWER LEG SOFT TISSUE TUMOR EXCISION  1994   cyst left ankle -involved muscle removed-limited mobility now   NASAL SEPTUM SURGERY     PUBOVAGINAL SLING N/A 06/04/2019   Procedure: Leonides Grills;  Surgeon: Alfredo Martinez, MD;  Location: [facility];  Service: Urology;  Laterality: N/A;    Home Medications: Prior to Admission medications   Medication Sig Start Date End Date Taking? Authorizing Provider  acetaminophen (TYLENOL) 650 MG CR tablet Take 1,300 mg by mouth in the morning and at bedtime.   Yes [provider]  albuterol (PROVENTIL HFA;VENTOLIN HFA) 108 (90 Base) MCG/ACT inhaler Inhale 2 puffs into the lungs every 6 (six) hours as needed for wheezing or shortness of breath.   Yes [provider]  atorvastatin (LIPITOR) 40 MG tablet Take 1 tablet (40 mg total) by mouth daily. 03/30/23 06/28/23 Yes Alver Sorrow, NP  benzonatate (TESSALON) 200 MG capsule Take 200 mg by mouth as needed. 04/18/22  Yes [provider]  clotrimazole-betamethasone (LOTRISONE) cream Apply 1 Application topically 2 (two) times daily as needed (for irritation). 12/15/21  Yes [provider]  docusate sodium (COLACE) 100 MG capsule Take 100 mg by mouth as needed for mild constipation.   Yes [provider]  dronedarone (MULTAQ) 400 MG tablet Take 1 tablet (400 mg total) by mouth 2 (two) times  daily with a meal. 03/17/22  Yes Newman Nip, NP  fluticasone (FLOVENT HFA) 110 MCG/ACT inhaler Inhale 2 puffs into the lungs 2 (two) times daily as needed (for seasonal flares/asthma).   Yes [provider]  gabapentin (NEURONTIN) 300 MG capsule Take 300 mg in the morning and 600 mg at night 03/22/23  Yes [provider]  hydrocortisone (ANUSOL-HC) 25 MG suppository Place 25 mg rectally as needed for hemorrhoids or anal itching.   Yes [provider]  hyoscyamine (LEVSIN SL) 0.125 MG SL tablet Place 0.125 mg under  the tongue 2 (two) times daily as needed for cramping. 11/17/21  Yes [provider]  metoprolol succinate (TOPROL XL) 25 MG 24 hr tablet Take 0.5 tablets (12.5 mg total) by mouth at bedtime. STOP lopressor 02/20/23 02/20/24 Yes Fenton, Clint R, PA  omeprazole (PRILOSEC) 40 MG capsule Take 40 mg by mouth daily before breakfast.   Yes [provider]  rivaroxaban (XARELTO) 20 MG TABS tablet Take 1 tablet (20 mg total) by mouth daily with supper. 02/01/23  Yes Chilton Si, MD  sertraline (ZOLOFT) 100 MG tablet Take 200 mg by mouth daily. 04/29/22  Yes [provider]  sucralfate (CARAFATE) 1 g tablet Take 1 g by mouth daily as needed (to coat the stomach). 11/16/21  Yes [provider]  triamcinolone (KENALOG) 0.1 % paste 1 Application 2 (two) times daily as needed (for fever blisters- apply to the lips).   Yes [provider]  valsartan-hydrochlorothiazide (DIOVAN-HCT) 320-25 MG tablet Take 1 tablet by mouth daily. 11/07/22  Yes Chilton Si, MD    Allergies: Allergies  Allergen Reactions   Shellfish Allergy Anaphylaxis, Nausea Only, Swelling and Other (See Comments)    Swelling of the throat   Sulfa Antibiotics Hives   Erythromycin Hives   Ibuprofen Other (See Comments)    Vertigo   Adhesive [Tape] Rash and Other (See Comments)    Rash with transderm   Amlodipine Hives, Swelling and Rash   Betadine [Povidone Iodine] Rash and Other (See Comments)    ALL TOPICAL IODINES   Povidone-Iodine Rash and Other (See Comments)    ALL TOPICAL IODINES   Silicone Rash and Other (See Comments)    Rash with transderm    Review of Systems: Review of Systems  Constitutional:  Negative for chills and fever.  Respiratory:  Negative for shortness of breath.   Cardiovascular:  Negative for chest pain.  Gastrointestinal:  Positive for abdominal pain. Negative for blood in stool and constipation.    Physical Exam BP 122/75   Pulse (!) 52   Ht 5\' 7"   (1.702 m)   Wt 215 lb 6.4 oz (97.7 kg)   SpO2 98%   BMI 33.74 kg/m  CONSTITUTIONAL: No acute distress HEENT:  Normocephalic, atraumatic, extraocular motion intact. RESPIRATORY:  Normal respiratory effort without pathologic use of accessory muscles. CARDIOVASCULAR: Regular rhythm and rate RECTAL: External exam reveals mildly enlarged external hemorrhoids overall stable without any inflammatory changes.  Digital rectal exam also shows mildly enlarged internal hemorrhoids without any gross bleeding.  No perianal pain or fissures noted. NEUROLOGIC:  Motor and sensation is grossly normal.  Cranial nerves are grossly intact. PSYCH:  Alert and oriented to person, place and time. Affect is normal.   Assessment and Plan: This is a 67 y.o. female with a history of bleeding internal hemorrhoids.  - The patient reports that with adding fiber to her diet, she has not had any further episodes of bleeding  over the last 2 months.  Denies any perianal pain.  At this point no surgical needs are needed.  Discussed with her that if in the future she is having any further issues with bleeding, she can contact us so we can reevaluate her.  Potentially since the main issue has been internal hemorrhoids, she could have a banding procedure if truly needed.  For now continue with fiber supplementation as she currently is doing. - The patient reports having right upper quadrant/epigastric pain with prior right upper quadrant ultrasound and HIDA scan showing no cholelithiasis and no biliary dyskinesia.  She used to follow-up with [facility] but her gastroenterologist has since retired.  She is asking for a new referral to gastroenterology for further management.  She currently takes Prilosec daily and Carafate as needed.  Will send referral to [facility]. - Follow-up as needed.  I spent 20 minutes dedicated to the care of this patient on the date of this encounter to include pre-visit review of records, face-to-face time  with the patient discussing diagnosis and management, and any post-visit coordination of care.   Howie Ill, MD [facility]

## 2023-04-21 NOTE — Patient Instructions (Addendum)
Hemorrhoids Hemorrhoids are swollen veins that may form: In the butt (rectum). These are called internal hemorrhoids. Around the opening of the butt (anus). These are called external hemorrhoids. Most hemorrhoids do not cause very bad problems. They often get better with changes to your lifestyle and what you eat. What are the causes? Having trouble pooping (constipation) or watery poop (diarrhea). Pushing too hard when you poop. Pregnancy. Being very overweight (obese). Sitting for too long. Riding a bike for a long time. Heavy lifting or other things that take a lot of effort. Anal sex. What are the signs or symptoms? Pain. Itching or soreness in the butt. Bleeding from the butt. Leaking poop. Swelling. One or more lumps around the opening of your butt. How is this treated? In most cases, hemorrhoids can be treated at home. You may be told to: Change what you eat. Make changes to your lifestyle. If these treatments do not help, you may need to have a procedure done. Your doctor may need to: Place rubber bands at the bottom of the hemorrhoids to make them fall off. Put medicine into the hemorrhoids to shrink them. Shine a type of light on the hemorrhoids to cause them to fall off. Do surgery to get rid of the hemorrhoids. Follow these instructions at home: Medicines Take over-the-counter and prescription medicines only as told by your doctor. Use creams with medicine in them or medicines that you put in your butt as told by your doctor. Eating and drinking  Eat foods that have a lot of fiber in them. These include whole grains, beans, nuts, fruits, and vegetables. Ask your doctor about taking products that have fiber added to them (fibersupplements). Take in less fat. You can do this by: Eating low-fat dairy products. Eating less red meat. Staying away from processed foods. Drink enough fluid to keep your pee (urine) pale yellow. Managing pain and swelling  Take a  warm-water bath (sitz bath) for 20 minutes to ease pain. Do this 3-4 times a day. You may do this in a bathtub. You may also use a portable sitz bath that fits over the toilet. If told, put ice on the painful area. It may help to use ice between your warm baths. Put ice in a plastic bag. Place a towel between your skin and the bag. Leave the ice on for 20 minutes, 2-3 times a day. If your skin turns bright red, take off the ice right away to prevent skin damage. The risk of damage is higher if you cannot feel pain, heat, or cold. General instructions Exercise. Ask your doctor how much and what kind of exercise is best for you. Go to the bathroom when you need to poop. Do not wait. Try not to push too hard when you poop. Keep your butt dry and clean. Use wet toilet paper or moist towelettes after you poop. Do not sit on the toilet for a long time. Contact a doctor if: You have pain and swelling that do not get better with treatment. You have trouble pooping. You cannot poop. You have pain or swelling outside the area of the hemorrhoids. Get help right away if: You have bleeding from the butt that will not stop. This information is not intended to replace advice given to you by your health care provider. Make sure you discuss any questions you have with your health care provider. Document Revised: 03/09/2022 Document Reviewed: 03/09/2022 Elsevier Patient Education  2024 Elsevier Inc.  

## 2023-05-23 ENCOUNTER — Other Ambulatory Visit: Payer: Self-pay | Admitting: Cardiovascular Disease

## 2023-05-23 NOTE — Telephone Encounter (Signed)
Prescription refill request for Xarelto received.  Indication:afib Last office visit:9/24 Weight:97.7  kg Age:67 Scr:1.06  8/24 CrCl:80.52  ml/min  Prescription refilled

## 2023-05-23 NOTE — Telephone Encounter (Signed)
Xarelto refill

## 2023-06-12 ENCOUNTER — Other Ambulatory Visit (HOSPITAL_COMMUNITY): Payer: Self-pay | Admitting: *Deleted

## 2023-06-12 MED ORDER — MULTAQ 400 MG PO TABS: 400.0000 mg | ORAL_TABLET | Freq: Two times a day (BID) | ORAL | 1 refills | Status: DC

## 2023-06-29 ENCOUNTER — Encounter (HOSPITAL_BASED_OUTPATIENT_CLINIC_OR_DEPARTMENT_OTHER): Payer: Self-pay

## 2023-06-29 ENCOUNTER — Encounter (HOSPITAL_BASED_OUTPATIENT_CLINIC_OR_DEPARTMENT_OTHER): Payer: Medicare Other | Admitting: Family

## 2023-06-29 ENCOUNTER — Encounter (HOSPITAL_COMMUNITY): Payer: Self-pay

## 2023-07-12 DIAGNOSIS — M1711 Unilateral primary osteoarthritis, right knee: Secondary | ICD-10-CM

## 2023-07-12 HISTORY — DX: Unilateral primary osteoarthritis, right knee: M17.11

## 2023-07-13 ENCOUNTER — Telehealth (HOSPITAL_BASED_OUTPATIENT_CLINIC_OR_DEPARTMENT_OTHER): Payer: Self-pay

## 2023-07-13 NOTE — Telephone Encounter (Signed)
 The slot she requested has been full (because folks take time to respond often better just to call to schedule appt).   I've placed a hold on 07/17/23 at 3:10 PM if you could call her to see if that works for her. If not, can offer 08/03/23 at 2:45 PM.  Brandy Daloia S Sargent Mankey, NP

## 2023-07-13 NOTE — Telephone Encounter (Signed)
 Please see telephone encounter 07/13/23.

## 2023-07-13 NOTE — Telephone Encounter (Signed)
 Called and spoke to patient. Pt would like to keep her current appointment as is 2/20. Refused closer appointment.

## 2023-07-20 ENCOUNTER — Telehealth (HOSPITAL_BASED_OUTPATIENT_CLINIC_OR_DEPARTMENT_OTHER): Payer: Self-pay

## 2023-07-20 NOTE — Telephone Encounter (Signed)
 Patient rescheduled for 1/23 @ 2:45p per APP.

## 2023-07-20 NOTE — Telephone Encounter (Signed)
 Patient called and scheduled in 1/23 @ 2:45p per NP.

## 2023-08-01 ENCOUNTER — Other Ambulatory Visit: Payer: Self-pay

## 2023-08-01 ENCOUNTER — Ambulatory Visit (INDEPENDENT_AMBULATORY_CARE_PROVIDER_SITE_OTHER): Payer: Medicare Other | Admitting: Gastroenterology

## 2023-08-01 ENCOUNTER — Ambulatory Visit: Payer: Medicare Other | Admitting: Gastroenterology

## 2023-08-01 VITALS — BP 131/76 | HR 60 | Temp 98.7°F | Ht 67.0 in | Wt 237.0 lb

## 2023-08-01 DIAGNOSIS — R1011 Right upper quadrant pain: Secondary | ICD-10-CM | POA: Diagnosis not present

## 2023-08-01 DIAGNOSIS — K625 Hemorrhage of anus and rectum: Secondary | ICD-10-CM

## 2023-08-01 MED ORDER — NA SULFATE-K SULFATE-MG SULF 17.5-3.13-1.6 GM/177ML PO SOLN: 354.0000 mL | Freq: Once | ORAL | 0 refills | Status: AC

## 2023-08-01 NOTE — Addendum Note (Signed)
Addended by: Adela Ports on: 08/01/2023 03:11 PM   Modules accepted: Orders

## 2023-08-01 NOTE — Patient Instructions (Addendum)
[facility] at 4:45 PM on Friday 08/04/2023. Please do not eat or drink 8 hours prior. If you need to reschedule or cancel, please call [phone].

## 2023-08-01 NOTE — Progress Notes (Signed)
Wyline Mood MD, MRCP(U.K) [address]  [address]  [place], [place] [address]  Main: [phone]  Fax: [phone]   Gastroenterology Consultation  Referring Provider:     Noberto Retort, MD Primary Care Physician:  Noberto Retort, MD Primary Gastroenterologist:  Dr. Wyline Mood  Reason for Consultation:     Bleeding Hemorroids        HPI:   Brandy Love is a 68 y.o. y/o female referred for consultation & management  by Dr. Tiburcio Pea, Tawni Pummel, MD.     She says that she was seen recently in the emergency room for an episode of rectal bleeding.  This was back in 02/14/2023 she says that it has not recurred significantly since but has noticed blood with the stool on multiple other occasions.  She says that her last colonoscopy was over 5 years back.  She complains of occasional right upper quadrant pain not related to meals not related to food intake nonspecific no clear aggravating or relieving factors.  On Xarelto.  02/14/2023 hemoglobin 12.3 g Past Medical History:  Diagnosis Date   A-fib (HCC)    Asthma    controlled well   Bladder prolapse, female, acquired    Edema extremities    lower legs at times   Gastric ulcer    GERD (gastroesophageal reflux disease)    Glaucoma    High blood pressure    OAB (overactive bladder)    Pre-diabetes    Prediabetes     Past Surgical History:  Procedure Laterality Date   ABDOMINAL HYSTERECTOMY  1993   ANTERIOR AND POSTERIOR REPAIR N/A 06/04/2019   Procedure: CYSTOSCOPY ANTERIOR (CYSTOCELE);  Surgeon: Alfredo Martinez, MD;  Location: [facility];  Service: Urology;  Laterality: N/A;   APPENDECTOMY     CATARACT EXTRACTION  2016   CYSTECTOMY     1994 and 1982   INTERSTIM IMPLANT PLACEMENT  04/10/2012   Procedure: Leane Platt IMPLANT FIRST STAGE;  Surgeon: Martina Sinner, MD;  Location: [facility];  Service: Urology;  Laterality: N/A;  rad tech ok per vicki at main    INTERSTIM IMPLANT PLACEMENT  04/10/2012    Procedure: Leane Platt IMPLANT SECOND STAGE;  Surgeon: Martina Sinner, MD;  Location: [facility];  Service: Urology;  Laterality: N/A;   LOWER LEG SOFT TISSUE TUMOR EXCISION  1994   cyst left ankle -involved muscle removed-limited mobility now   NASAL SEPTUM SURGERY     PUBOVAGINAL SLING N/A 06/04/2019   Procedure: Leonides Grills;  Surgeon: Alfredo Martinez, MD;  Location: [facility];  Service: Urology;  Laterality: N/A;    Prior to Admission medications   Medication Sig Start Date End Date Taking? Authorizing Provider  acetaminophen (TYLENOL) 650 MG CR tablet Take 1,300 mg by mouth in the morning and at bedtime.    [provider]  albuterol (PROVENTIL HFA;VENTOLIN HFA) 108 (90 Base) MCG/ACT inhaler Inhale 2 puffs into the lungs every 6 (six) hours as needed for wheezing or shortness of breath.    [provider]  atorvastatin (LIPITOR) 40 MG tablet Take 1 tablet (40 mg total) by mouth daily. 03/30/23 06/28/23  Alver Sorrow, NP  benzonatate (TESSALON) 200 MG capsule Take 200 mg by mouth as needed. 04/18/22   [provider]  clotrimazole-betamethasone (LOTRISONE) cream Apply 1 Application topically 2 (two) times daily as needed (for irritation). 12/15/21   [provider]  docusate sodium (COLACE) 100 MG capsule Take 100 mg  by mouth as needed for mild constipation.    [provider]  dronedarone (MULTAQ) 400 MG tablet Take 1 tablet (400 mg total) by mouth 2 (two) times daily with a meal. 06/12/23   Fenton, Clint R, PA  fluticasone (FLOVENT HFA) 110 MCG/ACT inhaler Inhale 2 puffs into the lungs 2 (two) times daily as needed (for seasonal flares/asthma).    [provider]  gabapentin (NEURONTIN) 300 MG capsule Take 300 mg in the morning and 600 mg at night 03/22/23   [provider]  hydrocortisone (ANUSOL-HC) 25 MG suppository Place 25 mg rectally as needed for hemorrhoids or anal itching.    [provider]  hyoscyamine (LEVSIN SL) 0.125 MG SL tablet Place 0.125 mg under the tongue 2 (two) times daily as needed for cramping. 11/17/21   [provider]  metoprolol succinate (TOPROL XL) 25 MG 24 hr tablet Take 0.5 tablets (12.5 mg total) by mouth at bedtime. STOP lopressor 02/20/23 02/20/24  Fenton, Clint R, PA  omeprazole (PRILOSEC) 40 MG capsule Take 40 mg by mouth daily before breakfast.    [provider]  rivaroxaban (XARELTO) 20 MG TABS tablet TAKE 1 TABLET (20 MG TOTAL) BY MOUTH DAILY WITH SUPPER. 05/23/23   Chilton Si, MD  sertraline (ZOLOFT) 100 MG tablet Take 200 mg by mouth daily. 04/29/22   [provider]  sucralfate (CARAFATE) 1 g tablet Take 1 g by mouth daily as needed (to coat the stomach). 11/16/21   [provider]  triamcinolone (KENALOG) 0.1 % paste 1 Application 2 (two) times daily as needed (for fever blisters- apply to the lips).    [provider]  valsartan-hydrochlorothiazide (DIOVAN-HCT) 320-25 MG tablet Take 1 tablet by mouth daily. 11/07/22   Chilton Si, MD    Family History  Problem Relation Age of Onset   Asthma Mother    Diabetes Mother    Hernia Mother    Obesity Mother    Heart disease Father    Prostate cancer Father    Atrial fibrillation Brother      Social History   Tobacco Use   Smoking status: Never    Passive exposure: Never   Smokeless tobacco: Never  Vaping Use   Vaping status: Never Used  Substance Use Topics   Alcohol use: No   Drug use: No    Allergies as of 08/01/2023 - Review Complete 08/01/2023  Allergen Reaction Noted   Shellfish allergy Anaphylaxis, Nausea Only, Swelling, and Other (See Comments) 05/23/2019   Sulfa antibiotics Hives 04/14/2022   Erythromycin Hives 10/26/2021   Ibuprofen Other (See Comments)    Adhesive [tape] Rash and Other (See Comments) 04/06/2012   Amlodipine Hives, Swelling, and Rash 10/17/2017   Betadine [povidone iodine] Rash and Other (See Comments)  01/07/2015   Povidone-iodine Rash and Other (See Comments) 01/07/2015   Silicone Rash and Other (See Comments) 04/06/2012    Review of Systems:    All systems reviewed and negative except where noted in HPI.   Physical Exam:  BP 131/76   Pulse 60   Temp 98.7 F (37.1 C) (Oral)   Ht 5\' 7"  (1.702 m)   Wt 237 lb (107.5 kg)   BMI 37.12 kg/m  No LMP recorded. Patient has had a hysterectomy. Psych:  Alert and cooperative. Normal mood and affect. General:   Alert,  Well-developed, well-nourished, pleasant and cooperative in NAD Head:  Normocephalic and atraumatic. Eyes:  Sclera clear, no icterus.   Conjunctiva pink. Abdomen:  Normal bowel sounds.  No bruits.  Soft, non-tender and non-distended without masses, hepatosplenomegaly or hernias noted.  No guarding or rebound tenderness.    Neurologic:  Alert and oriented x3;  grossly normal neurologically. Psych:  Alert and cooperative. Normal mood and affect.  Imaging Studies: No results found.  Assessment and Plan:   Brandy Love is a 68 y.o. y/o female has been referred for rectal bleeding.  Single episode back in 02/28/2023.  Occasional episodes subsequently.  Nonspecific right upper quadrant discomfort.  Plan 1.  Colonoscopy 2.  Right upper quadrant ultrasound   I have discussed alternative options, risks & benefits,  which include, but are not limited to, bleeding, infection, perforation,respiratory complication & drug reaction.  The patient agrees with this plan & written consent will be obtained.     Follow up in 3 to 4 months  Dr Wyline Mood MD,MRCP(U.K)

## 2023-08-03 ENCOUNTER — Telehealth (INDEPENDENT_AMBULATORY_CARE_PROVIDER_SITE_OTHER): Payer: Medicare Other | Admitting: Family

## 2023-08-03 ENCOUNTER — Encounter (HOSPITAL_BASED_OUTPATIENT_CLINIC_OR_DEPARTMENT_OTHER): Payer: Self-pay | Admitting: Family

## 2023-08-03 ENCOUNTER — Telehealth (HOSPITAL_BASED_OUTPATIENT_CLINIC_OR_DEPARTMENT_OTHER): Payer: Self-pay | Admitting: Family

## 2023-08-03 ENCOUNTER — Telehealth (HOSPITAL_BASED_OUTPATIENT_CLINIC_OR_DEPARTMENT_OTHER): Payer: Self-pay

## 2023-08-03 VITALS — BP 101/67 | HR 60 | Ht 67.0 in | Wt 235.0 lb

## 2023-08-03 DIAGNOSIS — Z01810 Encounter for preprocedural cardiovascular examination: Secondary | ICD-10-CM | POA: Diagnosis not present

## 2023-08-03 DIAGNOSIS — D6859 Other primary thrombophilia: Secondary | ICD-10-CM

## 2023-08-03 DIAGNOSIS — I480 Paroxysmal atrial fibrillation: Secondary | ICD-10-CM

## 2023-08-03 DIAGNOSIS — I10 Essential (primary) hypertension: Secondary | ICD-10-CM

## 2023-08-03 DIAGNOSIS — Z8673 Personal history of transient ischemic attack (TIA), and cerebral infarction without residual deficits: Secondary | ICD-10-CM

## 2023-08-03 DIAGNOSIS — E782 Mixed hyperlipidemia: Secondary | ICD-10-CM

## 2023-08-03 MED ORDER — MULTAQ 400 MG PO TABS: 400.0000 mg | ORAL_TABLET | Freq: Two times a day (BID) | ORAL | 4 refills | Status: DC

## 2023-08-03 NOTE — Progress Notes (Signed)
Virtual Visit via Telephone Note   Because of Brandy Love's co-morbid illnesses, she is at least at moderate risk for complications without adequate follow up.  This format is felt to be most appropriate for this patient at this time.  The patient did not have access to video technology/had technical difficulties with video requiring transitioning to audio format only (telephone).  All issues noted in this document were discussed and addressed.  No physical exam could be performed with this format.  Please refer to the patient's chart for her consent to telehealth for [facility].   Date:  08/03/2023   ID:  Brandy Love, DOB 29-Jun-1956, MRN [id number] The patient was identified using 2 identifiers.  Patient Location: Home Provider Location: Office/Clinic   PCP:  Noberto Retort, MD   [facility] Providers Cardiologist:  Chilton Si, MD Cardiology APP:  Alver Sorrow, NP     Evaluation Performed:  Follow-Up Visit  Chief Complaint:  Preop clearance / HTN  History of Present Illness:    Brandy Love is a 68 y.o. female with a hx of PAF, CVA (04/2022), HTN, asthma, gastric ulcer, GERD here to follow up  in the [facility] via phone visit.    Presented to the ED 01/27/2022 with palpitations diagnosed with atrial fibrillation started on metoprolol and OAC.  Subsequent monitor showed 2% atrial fibrillation burden and 1.8% SV ectopy.  Has since followed with atrial fibrillation clinic and been started on Multaq.   04/16/22 presented to ED with thalamic stroke started on aspirin in conjunction to Xarelto and statin.   Established with [facility] 11/07/22 with BP uncontrolled on multiple agents. Initially diagnosed with hypertension in 2011. At her visit, Losartan and hydrochlorothiazide were stopped and instead started on Valsartan/hydrochlorothiazide 320/25mg  daily as well as Spironolactone 25mg  daily. Renal duplex 12/12/22  with no stenosis. Renin-aldosterone not consistent with hyperaldosteronism.    She was last seen 02/02/2023 with home systolic BP 109-113 with occasional lightheadedness with position changes.  Spironolactone was discontinued.  She was recommended to continue valsartan-HCTZ 320-25 mg daily.   At visit 03/30/23 her BP was minimally elevated at 132/76 and present regimen valsartan-HCTZ 320-25 mg daily, Toprol 12.5 mg nightly continued. She was not taking Atorvastatin and it was resumed at 40mg .   Presents today for follow up. Pleasant lady who enjoys spending time with husband and grandchildren. Walking her dog for exercise. She has had respiratory infection for the last 3 weeks. Her husband did test positive for COVID, but she did not test as hers felt different.   Reports she is having "flutters" every once in awhile lasting 30 seconds to one minute. These began right before she got sick. She did have a fall November 1st which has exacerbated her knee again and is considering repeat surgery. She reports her leg fell asleep and gave way but no near syncope, syncope. Reports her blood pressure at doctors visits has been 130s and at home 120-130s.     Previous antihypertensives: Amlodipine - hives, swelling   Past Medical History:  Diagnosis Date   A-fib (HCC)    Asthma    controlled well   Bladder prolapse, female, acquired    Edema extremities    lower legs at times   Gastric ulcer    GERD (gastroesophageal reflux disease)    Glaucoma    High blood pressure    OAB (overactive bladder)    Pre-diabetes    Prediabetes  Past Surgical History:  Procedure Laterality Date   ABDOMINAL HYSTERECTOMY  1993   ANTERIOR AND POSTERIOR REPAIR N/A 06/04/2019   Procedure: CYSTOSCOPY ANTERIOR (CYSTOCELE);  Surgeon: Alfredo Martinez, MD;  Location: [facility];  Service: Urology;  Laterality: N/A;   APPENDECTOMY     CATARACT EXTRACTION  2016   CYSTECTOMY     1994 and 1982   INTERSTIM IMPLANT PLACEMENT   04/10/2012   Procedure: Leane Platt IMPLANT FIRST STAGE;  Surgeon: Martina Sinner, MD;  Location: [facility];  Service: Urology;  Laterality: N/A;  rad tech ok per vicki at [facility]    INTERSTIM IMPLANT PLACEMENT  04/10/2012   Procedure: Leane Platt IMPLANT SECOND STAGE;  Surgeon: Martina Sinner, MD;  Location: [facility];  Service: Urology;  Laterality: N/A;   LOWER LEG SOFT TISSUE TUMOR EXCISION  1994   cyst left ankle -involved muscle removed-limited mobility now   NASAL SEPTUM SURGERY     PUBOVAGINAL SLING N/A 06/04/2019   Procedure: Leonides Grills;  Surgeon: Alfredo Martinez, MD;  Location: [facility];  Service: Urology;  Laterality: N/A;     Current Meds  Medication Sig   acetaminophen (TYLENOL) 650 MG CR tablet Take 1,300 mg by mouth in the morning and at bedtime.   albuterol (PROVENTIL HFA;VENTOLIN HFA) 108 (90 Base) MCG/ACT inhaler Inhale 2 puffs into the lungs every 6 (six) hours as needed for wheezing or shortness of breath.   atorvastatin (LIPITOR) 40 MG tablet Take 1 tablet (40 mg total) by mouth daily.   benzonatate (TESSALON) 200 MG capsule Take 200 mg by mouth as needed.   clotrimazole-betamethasone (LOTRISONE) cream Apply 1 Application topically 2 (two) times daily as needed (for irritation).   diclofenac Sodium (VOLTAREN) 1 % GEL as needed (knee pain).   docusate sodium (COLACE) 100 MG capsule Take 100 mg by mouth as needed for mild constipation.   dronedarone (MULTAQ) 400 MG tablet Take 1 tablet (400 mg total) by mouth 2 (two) times daily with a meal.   gabapentin (NEURONTIN) 300 MG capsule daily at 6 (six) AM. Take 300 mg in the morning 300mg  noon, and 900 mg at night   hydrocortisone (ANUSOL-HC) 25 MG suppository Place 25 mg rectally as needed for hemorrhoids or anal itching.   hyoscyamine (LEVSIN SL) 0.125 MG SL tablet Place 0.125 mg under the tongue 2 (two) times daily as needed for cramping.   metoprolol succinate (TOPROL XL) 25 MG 24 hr  tablet Take 0.5 tablets (12.5 mg total) by mouth at bedtime. STOP lopressor   Na Sulfate-K Sulfate-Mg Sulfate concentrate 17.5-3.13-1.6 GM/177ML SOLN as directed. Colnoscopy prep   omeprazole (PRILOSEC) 40 MG capsule Take 40 mg by mouth daily before breakfast.   rivaroxaban (XARELTO) 20 MG TABS tablet TAKE 1 TABLET (20 MG TOTAL) BY MOUTH DAILY WITH SUPPER.   sertraline (ZOLOFT) 100 MG tablet Take 200 mg by mouth daily.   sucralfate (CARAFATE) 1 g tablet Take 1 g by mouth daily as needed (to coat the stomach).   triamcinolone (KENALOG) 0.1 % paste 1 Application 2 (two) times daily as needed (for fever blisters- apply to the lips).   valsartan-hydrochlorothiazide (DIOVAN-HCT) 320-25 MG tablet Take 1 tablet by mouth daily.     Allergies:   Shellfish allergy, Sulfa antibiotics, Erythromycin, Ibuprofen, Adhesive [tape], Amlodipine, Betadine [povidone iodine], Povidone-iodine, and Silicone   Social History   Tobacco Use   Smoking status: Never    Passive exposure: Never   Smokeless tobacco: Never  Vaping Use  Vaping status: Never Used  Substance Use Topics   Alcohol use: No   Drug use: No     Family Hx: The patient's family history includes Asthma in her mother; Atrial fibrillation in her brother; Diabetes in her mother; Heart disease in her father; Hernia in her mother; Obesity in her mother; Prostate cancer in her father.  ROS:   Please see the history of present illness.     All other systems reviewed and are negative.   Prior CV studies:   The following studies were reviewed today:  Cardiac Studies & Procedures      ECHOCARDIOGRAM  ECHOCARDIOGRAM COMPLETE 04/15/2022  Narrative ECHOCARDIOGRAM REPORT    Patient Name:   NATSUKI EVERETT Date of Exam: 04/15/2022 Medical Rec #:  [id number]     Height:       67.0 in Accession #:    [id number]    Weight:       230.0 lb Date of Birth:  December 23, 1955    BSA:          2.146 m Patient Age:    65 years      BP:           125/58  mmHg Patient Gender: F             HR:           59 bpm. Exam Location:  Inpatient  Procedure: 2D Echo, Cardiac Doppler and Color Doppler  Indications:    Stroke I63.9  History:        Patient has prior history of Echocardiogram examinations, most recent 03/02/2022. Stroke, Arrythmias:Atrial Fibrillation, Signs/Symptoms:Shortness of Breath; Risk Factors:Hypertension and Diabetes.  Sonographer:    Lucendia Herrlich Referring Phys: [id number] Lyn Hollingshead B MELVIN  IMPRESSIONS   1. Left ventricular ejection fraction, by estimation, is 55 to 60%. The left ventricle has normal function. The left ventricle has no regional wall motion abnormalities. Left ventricular diastolic parameters are consistent with Grade I diastolic dysfunction (impaired relaxation). 2. Right ventricular systolic function is normal. The right ventricular size is normal. Tricuspid regurgitation signal is inadequate for assessing PA pressure. 3. Left atrial size was mildly dilated. 4. The mitral valve is grossly normal. Trivial mitral valve regurgitation. 5. The aortic valve was not well visualized. Aortic valve regurgitation is not visualized. Aortic valve sclerosis/calcification is present, without any evidence of aortic stenosis. 6. The inferior vena cava is normal in size with greater than 50% respiratory variability, suggesting right atrial pressure of 3 mmHg.  Comparison(s): Compared to prior TTE in 03/02/22, the MR appears less. Otherwise, there is no significant change.  Conclusion(s)/Recommendation(s): No intracardiac source of embolism detected on this transthoracic study. Consider a transesophageal echocardiogram to exclude cardiac source of embolism if clinically indicated.  FINDINGS Left Ventricle: Left ventricular ejection fraction, by estimation, is 55 to 60%. The left ventricle has normal function. The left ventricle has no regional wall motion abnormalities. The left ventricular internal cavity size was normal  in size. There is no left ventricular hypertrophy. Left ventricular diastolic parameters are consistent with Grade I diastolic dysfunction (impaired relaxation).  Right Ventricle: The right ventricular size is normal. No increase in right ventricular wall thickness. Right ventricular systolic function is normal. Tricuspid regurgitation signal is inadequate for assessing PA pressure.  Left Atrium: Left atrial size was mildly dilated.  Right Atrium: Right atrial size was normal in size.  Pericardium: There is no evidence of pericardial effusion.  Mitral Valve: The mitral valve is grossly normal.  Mild mitral annular calcification. Trivial mitral valve regurgitation.  Tricuspid Valve: The tricuspid valve is normal in structure. Tricuspid valve regurgitation is not demonstrated.  Aortic Valve: The aortic valve was not well visualized. Aortic valve regurgitation is not visualized. Aortic valve sclerosis/calcification is present, without any evidence of aortic stenosis. Aortic valve mean gradient measures 7.0 mmHg. Aortic valve peak gradient measures 12.1 mmHg. Aortic valve area, by VTI measures 2.26 cm.  Pulmonic Valve: The pulmonic valve was normal in structure. Pulmonic valve regurgitation is trivial.  Aorta: The aortic root is normal in size and structure.  Venous: The inferior vena cava is normal in size with greater than 50% respiratory variability, suggesting right atrial pressure of 3 mmHg.  IAS/Shunts: The atrial septum is grossly normal.   LEFT VENTRICLE PLAX 2D LVIDd:         4.70 cm   Diastology LVIDs:         3.10 cm   LV e' medial:    6.53 cm/s LV PW:         0.90 cm   LV E/e' medial:  14.1 LV IVS:        0.80 cm   LV e' lateral:   7.72 cm/s LVOT diam:     2.00 cm   LV E/e' lateral: 11.9 LV SV:         88 LV SV Index:   41 LVOT Area:     3.14 cm   RIGHT VENTRICLE             IVC RV S prime:     13.60 cm/s  IVC diam: 1.90 cm TAPSE (M-mode): 1.9 cm  LEFT ATRIUM              Index        RIGHT ATRIUM           Index LA diam:        3.80 cm 1.77 cm/m   RA Area:     18.50 cm LA Vol (A2C):   58.2 ml 27.12 ml/m  RA Volume:   43.10 ml  20.08 ml/m LA Vol (A4C):   36.9 ml 17.19 ml/m LA Biplane Vol: 48.9 ml 22.79 ml/m AORTIC VALVE AV Area (Vmax):    2.26 cm AV Area (Vmean):   2.23 cm AV Area (VTI):     2.26 cm AV Vmax:           174.00 cm/s AV Vmean:          117.000 cm/s AV VTI:            0.391 m AV Peak Grad:      12.1 mmHg AV Mean Grad:      7.0 mmHg LVOT Vmax:         125.00 cm/s LVOT Vmean:        83.050 cm/s LVOT VTI:          0.281 m LVOT/AV VTI ratio: 0.72  AORTA Ao Root diam: 3.30 cm Ao Asc diam:  2.80 cm  MITRAL VALVE MV Area (PHT): 3.48 cm     SHUNTS MV Decel Time: 218 msec     Systemic VTI:  0.28 m MV E velocity: 92.00 cm/s   Systemic Diam: 2.00 cm MV A velocity: 104.00 cm/s MV E/A ratio:  0.88  Laurance Flatten MD Electronically signed by Laurance Flatten MD Signature Date/Time: 04/15/2022/10:31:16 AM    Final   MONITORS  LONG TERM MONITOR (3-14 DAYS) 03/09/2022  Narrative Patch Wear  Time:  14 days and 0 hours  Predominant rhythm was sinus rhythm 2% atrial fibrillation burden 1.8% supraventricular ectopy Less than 1% ventricular ectopy Triggered episodes associated with both sinus rhythm and sinus rhythm with PACs  Will Camnitz, MD            Labs/Other Tests and Data Reviewed:    EKG:  An ECG dated 11/07/22 was personally reviewed today and demonstrated:  SB 59 bpm with first degree AV block PR  Recent Labs: 11/17/2022: Magnesium 2.2 02/14/2023: ALT 20; BUN 17; Creatinine, Ser 1.06; Hemoglobin 12.3; Platelets 171; Potassium 3.6; Sodium 140   Recent Lipid Panel Lab Results  Component Value Date/Time   CHOL 181 04/14/2022 10:18 PM   CHOL 189 07/29/2019 02:55 PM   TRIG 65 04/14/2022 10:18 PM   HDL 49 04/14/2022 10:18 PM   HDL 43 07/29/2019 02:55 PM   CHOLHDL 3.7 04/14/2022 10:18 PM   LDLCALC 119  (H) 04/14/2022 10:18 PM   LDLCALC 125 (H) 07/29/2019 02:55 PM    Wt Readings from Last 3 Encounters:  08/03/23 235 lb (106.6 kg)  08/01/23 237 lb (107.5 kg)  04/21/23 215 lb 6.4 oz (97.7 kg)     Risk Assessment/Calculations:    CHA2DS2-VASc Score = 5   This indicates a 7.2% annual risk of stroke. The patient's score is based upon: CHF History: 0 HTN History: 1 Diabetes History: 0 Stroke History: 2 Vascular Disease History: 0 Age Score: 1 Gender Score: 1         Objective:    Vital Signs:  BP 101/67 (BP Location: Right Arm)   Pulse 60   Ht 5\' 7"  (1.702 m)   Wt 235 lb (106.6 kg)   SpO2 (!) 0%   BMI 36.81 kg/m    VITAL SIGNS:  reviewed  ASSESSMENT & PLAN:    Preop - Pending colonoscopy and endoscopy. According to the Revised Cardiac Risk Index (RCRI), her Perioperative Risk of Major Cardiac Event is (%): 0.9. Her Functional Capacity in METs is: 5.07 according to the Duke Activity Status Index (DASI). Per AHA/ACC guidelines, she is deemed acceptable risk for the planned procedure without additional cardiovascular testing. Will route to surgical team so they are aware.  Xarelto hold presently being addressed by pharmacy team and update will be provided when available.  HTN - Prior lightheadedness with relative hypotension. SBP at home 120-130s. Continue present antihypertensive regimen valsartan-HCTZ 320-25 mg daily, Toprol 12.5 mg nightly. Discussed to monitor BP at home at least 2 hours after medications and sitting for 5-10 minutes.   PAF / Hypercoagulable state - reports intermittent "flutters" which self resolve after <1 minute. Will not increase Toprol due to relative bradycardia. We discussed possible repeat monitor and as symptoms are fleeting and not bothersome agreed to defer unless symptoms worsen.  Continue Xarelto 20mg  daily. Continue Multaq 400mg  BID, Toprol 12.5mg  at bedtime. Multaq refill provided. CHA2DS2-VASc Score = 5 [CHF History: 0, HTN History: 1,  Diabetes History: 0, Stroke History: 2, Vascular Disease History: 0, Age Score: 1, Gender Score: 1].  Therefore, the patient's annual risk of stroke is 7.2 %.     Previously declined Watchman as GI bleeding had not recurred.    History of CVA / HLD, LDL goal <55 - Continue Atorvastatin 40mg  daily. 05/02/23 LDL 61. This was significantly decreased from prior.  LDL goal less than 55 given history of stroke, age, hypertension. Continue working on lifestyle changes to help get LDL to goal.  Time:   Today, I have spent 20 minutes with the patient with telehealth technology discussing the above problems.     Medication Adjustments/Labs and Tests Ordered: Current medicines are reviewed at length with the patient today.  Concerns regarding medicines are outlined above.   Tests Ordered: No orders of the defined types were placed in this encounter.   Medication Changes: No orders of the defined types were placed in this encounter.   Follow Up:  In Person in 3 month(s)  Signed, Alver Sorrow, NP  08/03/2023 2:52 PM    [facility]

## 2023-08-03 NOTE — Patient Instructions (Signed)
Medication Instructions:  Continue your current medications.   *If you need a refill on your cardiac medications before your next appointment, please call your pharmacy*  Follow-Up: At [facility], you and your health needs are our priority.  As part of our continuing mission to provide you with exceptional heart care, we have created designated Provider Care Teams.  These Care Teams include your primary Cardiologist (physician) and Advanced Practice Providers (APPs -  Physician Assistants and Nurse Practitioners) who all work together to provide you with the care you need, when you need it.  We recommend signing up for the patient portal called "MyChart".  Sign up information is provided on this After Visit Summary.  MyChart is used to connect with patients for Virtual Visits (Telemedicine).  Patients are able to view lab/test results, encounter notes, upcoming appointments, etc.  Non-urgent messages can be sent to your provider as well.   To learn more about what you can do with MyChart, go to [email or web address].    Your next appointment:   April 10th at 8:50 AM  Other Instructions  Alver Sorrow, NP will send a note to Dr. Tobi Bastos that you are good to go for your procedure. We will  To prevent palpitations: Make sure you are adequately hydrated.  Avoid and/or limit caffeine containing beverages like soda or tea. Exercise regularly.  Manage stress well. Some over the counter medications can cause palpitations such as Benadryl, AdvilPM, TylenolPM. Regular Advil or Tylenol do not cause palpitations.

## 2023-08-03 NOTE — Telephone Encounter (Signed)
   Name: Brandy Love  DOB: 07/14/1955  MRN: [id number]  Primary Cardiologist: None  Chart reviewed as part of pre-operative protocol coverage. The patient has an upcoming visit scheduled with Gillian Shields, NP on 08/03/2023 at which time clearance can be addressed in case there are any issues that would impact surgical recommendations.  I added preop FYI to appointment note so that provider is aware to address at time of outpatient visit.  Per office protocol the cardiology provider should forward their finalized clearance decision and recommendations regarding antiplatelet therapy to the requesting party below.    This message will also be routed to pharmacy pool for input on holding Xarelto as requested below so that this information is available to the clearing provider at time of patient's appointment.   I will route this message as FYI to requesting party and remove this message from the preop box as separate preop APP input not needed at this time.   Please call with any questions.  Napoleon Form, Leodis Rains, NP  08/03/2023, 11:45 AM

## 2023-08-03 NOTE — Telephone Encounter (Signed)
   Pre-operative Risk Assessment    Patient Name: Brandy Love  DOB: April 11, 1956 MRN: [id number]   Date of last office visit: 03/30/2023 Date of next office visit: 08/03/2023   Request for Surgical Clearance    Procedure:   upper endoscopy and colonoscopy  Date of Surgery:  Clearance 08/16/23                                Surgeon:  Wyline Mood, MD Surgeon's Group or Practice Name:  [facility] Phone number:  [phone] Fax number:  [phone]   Type of Clearance Requested:   - Medical  - Pharmacy:  Hold Rivaroxaban (Xarelto)  when can pt stop Xarelto and when can pt restart?   Type of Anesthesia:  Not Indicated   Additional requests/questions:  Please fax a copy of clearance to the surgeon's office. ATTNAdela Ports, CMA  Signed, Gabriel Rung   08/03/2023, 11:17 AM

## 2023-08-03 NOTE — Telephone Encounter (Signed)
Patient with diagnosis of afib on Xarelto for anticoagulation.    Procedure: upper endoscopy and colonoscopy  Date of procedure: 08/16/23   CHA2DS2-VASc Score = 5   This indicates a 7.2% annual risk of stroke. The patient's score is based upon: CHF History: 0 HTN History: 1 Diabetes History: 0 Stroke History: 2 Vascular Disease History: 0 Age Score: 1 Gender Score: 1      CrCl 65 ml/min Platelet count 171  Per office protocol, patient can hold Xarelot for 2 days prior to procedure.    Patient should resume as soon as safely possible. Ideally within 24 hours.  **This guidance is not considered finalized until pre-operative APP has relayed final recommendations.**

## 2023-08-03 NOTE — Telephone Encounter (Signed)
New Message:    Patient has an appointment with [facility] today. She wants to know if she can do it as a phone visit please?

## 2023-08-03 NOTE — Telephone Encounter (Signed)
Medical clearance addressed via phone visit 08/03/23. Exercise tolerance >4 METS. Per AHA/ACC guidelines, she is deemed acceptable risk for the planned procedure without additional cardiovascular testing.  Await PharmD recommendations, Mrs. Busch is aware we will contact once available.  Alver Sorrow, NP

## 2023-08-03 NOTE — Telephone Encounter (Signed)
Called and spoke to pt. NP ok with changing appt to phone visit. Ensured she would take her blood pressure prior to visit. She verbalized understanding.

## 2023-08-03 NOTE — Telephone Encounter (Signed)
   Patient Name: Brandy Love  DOB: May 09, 1956 MRN: [id number]  Primary Cardiologist: Chilton Si, MD  Clinical pharmacists have reviewed the patient's past medical history, labs, and current medications as part of preoperative protocol coverage. The following recommendations have been made:   Per office protocol, patient can hold Xarelot for 2 days prior to procedure.    I will route this recommendation to the requesting party via Epic fax function and remove from pre-op pool.  Please call with questions.  Napoleon Form, Leodis Rains, NP 08/03/2023, 4:16 PM

## 2023-08-04 ENCOUNTER — Ambulatory Visit
Admission: RE | Admit: 2023-08-04 | Discharge: 2023-08-04 | Disposition: A | Discharge status: Home / Self Care | Payer: Medicare Other | Source: Ambulatory Visit | Attending: Gastroenterology | Admitting: Gastroenterology

## 2023-08-04 DIAGNOSIS — R1011 Right upper quadrant pain: Secondary | ICD-10-CM | POA: Diagnosis present

## 2023-08-07 ENCOUNTER — Encounter (HOSPITAL_BASED_OUTPATIENT_CLINIC_OR_DEPARTMENT_OTHER): Payer: Self-pay

## 2023-08-15 ENCOUNTER — Encounter: Payer: Self-pay | Admitting: Gastroenterology

## 2023-08-16 ENCOUNTER — Ambulatory Visit: Payer: Medicare Other | Admitting: Anesthesiology

## 2023-08-16 ENCOUNTER — Ambulatory Visit
Admission: RE | Admit: 2023-08-16 | Discharge: 2023-08-16 | Disposition: A | Discharge status: Home / Self Care | Payer: Medicare Other | Attending: Gastroenterology | Admitting: Gastroenterology

## 2023-08-16 ENCOUNTER — Encounter: Payer: Self-pay | Admitting: Gastroenterology

## 2023-08-16 ENCOUNTER — Encounter
Admission: RE | Disposition: A | Discharge status: Home / Self Care | Payer: Self-pay | Source: Home / Self Care | Attending: Gastroenterology

## 2023-08-16 DIAGNOSIS — J45909 Unspecified asthma, uncomplicated: Secondary | ICD-10-CM | POA: Diagnosis not present

## 2023-08-16 DIAGNOSIS — K5730 Diverticulosis of large intestine without perforation or abscess without bleeding: Secondary | ICD-10-CM | POA: Insufficient documentation

## 2023-08-16 DIAGNOSIS — D124 Benign neoplasm of descending colon: Secondary | ICD-10-CM | POA: Diagnosis not present

## 2023-08-16 DIAGNOSIS — Z8673 Personal history of transient ischemic attack (TIA), and cerebral infarction without residual deficits: Secondary | ICD-10-CM | POA: Diagnosis not present

## 2023-08-16 DIAGNOSIS — D122 Benign neoplasm of ascending colon: Secondary | ICD-10-CM | POA: Diagnosis not present

## 2023-08-16 DIAGNOSIS — Z8711 Personal history of peptic ulcer disease: Secondary | ICD-10-CM | POA: Diagnosis not present

## 2023-08-16 DIAGNOSIS — K625 Hemorrhage of anus and rectum: Secondary | ICD-10-CM | POA: Diagnosis present

## 2023-08-16 DIAGNOSIS — D125 Benign neoplasm of sigmoid colon: Secondary | ICD-10-CM | POA: Diagnosis not present

## 2023-08-16 DIAGNOSIS — R1011 Right upper quadrant pain: Secondary | ICD-10-CM

## 2023-08-16 DIAGNOSIS — K640 First degree hemorrhoids: Secondary | ICD-10-CM | POA: Insufficient documentation

## 2023-08-16 DIAGNOSIS — K219 Gastro-esophageal reflux disease without esophagitis: Secondary | ICD-10-CM | POA: Insufficient documentation

## 2023-08-16 DIAGNOSIS — I10 Essential (primary) hypertension: Secondary | ICD-10-CM | POA: Insufficient documentation

## 2023-08-16 DIAGNOSIS — D126 Benign neoplasm of colon, unspecified: Secondary | ICD-10-CM

## 2023-08-16 DIAGNOSIS — K635 Polyp of colon: Secondary | ICD-10-CM

## 2023-08-16 HISTORY — PX: POLYPECTOMY: SHX5525

## 2023-08-16 HISTORY — PX: COLONOSCOPY WITH PROPOFOL: SHX5780

## 2023-08-16 HISTORY — DX: Cerebral infarction, unspecified: I63.9

## 2023-08-16 SURGERY — COLONOSCOPY WITH PROPOFOL
Anesthesia: General

## 2023-08-16 MED ORDER — PROPOFOL 10 MG/ML IV BOLUS
INTRAVENOUS | Status: DC | PRN
  Administered 2023-08-16: 40 mg via INTRAVENOUS
  Administered 2023-08-16: 20 mg via INTRAVENOUS

## 2023-08-16 MED ORDER — LIDOCAINE HCL (CARDIAC) PF 100 MG/5ML IV SOSY
PREFILLED_SYRINGE | INTRAVENOUS | Status: DC | PRN
  Administered 2023-08-16: 80 mg via INTRAVENOUS

## 2023-08-16 MED ORDER — DEXMEDETOMIDINE HCL IN NACL 80 MCG/20ML IV SOLN
INTRAVENOUS | Status: DC | PRN
  Administered 2023-08-16: 20 ug via INTRAVENOUS

## 2023-08-16 MED ORDER — PROPOFOL 500 MG/50ML IV EMUL
INTRAVENOUS | Status: DC | PRN
  Administered 2023-08-16: 75 ug/kg/min via INTRAVENOUS

## 2023-08-16 MED ORDER — SODIUM CHLORIDE 0.9 % IV SOLN: INTRAVENOUS | Status: DC

## 2023-08-16 NOTE — Op Note (Signed)
 [facility] Patient Name: Brandy Love Procedure Date: 08/16/2023 11:21 AM MRN: [id number] Account #: [id number] Date of Birth: [birth year] Admit Type: Outpatient Age: 68 Room: [facility] ENDO ROOM 3 Gender: Female Note Status: Finalized Instrument Name: Arvis [id number] Procedure:             Colonoscopy Indications:           Rectal bleeding Providers:             Ruel Kung MD, MD Medicines:             Monitored Anesthesia Care Complications:         No immediate complications. Procedure:             Pre-Anesthesia Assessment:                        - Prior to the procedure, a History and Physical was                         performed, and patient medications, allergies and                         sensitivities were reviewed. The patient's tolerance                         of previous anesthesia was reviewed.                        After obtaining informed consent, the colonoscope was                         passed under direct vision. Throughout the procedure,                         the patient's blood pressure, pulse, and oxygen                         saturations were monitored continuously. The                         Colonoscope was introduced through the anus and                         advanced to the the cecum, identified by the                         appendiceal orifice. The colonoscopy was performed                         with ease. The patient tolerated the procedure well.                         The quality of the bowel preparation was excellent.                         The ileocecal valve, appendiceal orifice, and rectum                         were photographed. Findings:      The perianal and digital rectal examinations were normal.  Two sessile polyps were found in the ascending colon. The polyps were 4       to 5 mm in size. These polyps were removed with a cold snare. Resection       and retrieval were complete.      Two  sessile polyps were found in the sigmoid colon and descending colon.       The polyps were 5 to 6 mm in size. These polyps were removed with a cold       snare. Resection and retrieval were complete.      Multiple medium-mouthed diverticula were found in the sigmoid colon.      Non-bleeding hemorrhoids were found during retroflexion. The hemorrhoids       were medium-sized and Grade I (internal hemorrhoids that do not       prolapse).      The exam was otherwise without abnormality on direct and retroflexion       views. Impression:            - Two 4 to 5 mm polyps in the ascending colon, removed                         with a cold snare. Resected and retrieved.                        - Two 5 to 6 mm polyps in the sigmoid colon and in the                         descending colon, removed with a cold snare. Resected                         and retrieved.                        - Diverticulosis in the sigmoid colon.                        - Non-bleeding hemorrhoids.                        - The examination was otherwise normal on direct and                         retroflexion views. Recommendation:        - Discharge patient to home (with escort).                        - Resume previous diet.                        - Continue present medications.                        - Await pathology results.                        - Repeat colonoscopy in 3 years for surveillance. Procedure Code(s):     --- Professional ---                        [phone], Colonoscopy, flexible; with removal of  tumor(s), polyp(s), or other lesion(s) by snare                         technique Diagnosis Code(s):     --- Professional ---                        D12.2, Benign neoplasm of ascending colon                        D12.5, Benign neoplasm of sigmoid colon                        D12.4, Benign neoplasm of descending colon                        K64.0, First degree hemorrhoids                         K62.5, Hemorrhage of anus and rectum                        K57.30, Diverticulosis of large intestine without                         perforation or abscess without bleeding CPT copyright 2022 American Medical Association. All rights reserved. The codes documented in this report are preliminary and upon coder review may  be revised to meet current compliance requirements. Ruel Kung, MD Ruel Kung MD, MD 08/16/2023 11:43:30 AM This report has been signed electronically. Number of Addenda: 0 Note Initiated On: 08/16/2023 11:21 AM Scope Withdrawal Time: 0 hours 11 minutes 31 seconds  Total Procedure Duration: 0 hours 14 minutes 33 seconds  Estimated Blood Loss:  Estimated blood loss: none.      [facility]

## 2023-08-16 NOTE — H&P (Signed)
 Ruel Kung, MD [address], [address], [place], [place], [address] [address], [address], [place], [place], [address] Phone: [phone]  Fax: [phone]  Primary Care Physician:  Arloa Elsie SAUNDERS, MD   Pre-Procedure History & Physical: HPI:  Brandy Love is a 68 y.o. female is here for an colonoscopy.   Past Medical History:  Diagnosis Date   A-fib (HCC)    Asthma    controlled well   Bladder prolapse, female, acquired    Edema extremities    lower legs at times   Gastric ulcer    GERD (gastroesophageal reflux disease)    Glaucoma    High blood pressure    OAB (overactive bladder)    Pre-diabetes    Prediabetes     Past Surgical History:  Procedure Laterality Date   ABDOMINAL HYSTERECTOMY  1993   ANTERIOR AND POSTERIOR REPAIR N/A 06/04/2019   Procedure: CYSTOSCOPY ANTERIOR (CYSTOCELE);  Surgeon: Gaston Hamilton, MD;  Location: [facility];  Service: Urology;  Laterality: N/A;   APPENDECTOMY     CATARACT EXTRACTION  2016   CYSTECTOMY     1994 and 1982   EYE SURGERY     INTERSTIM IMPLANT PLACEMENT  04/10/2012   Procedure: RENNA IMPLANT FIRST STAGE;  Surgeon: Hamilton DELENA Gaston, MD;  Location: [facility];  Service: Urology;  Laterality: N/A;  rad tech ok per vicki at [facility]    INTERSTIM IMPLANT PLACEMENT  04/10/2012   Procedure: INTERSTIM IMPLANT SECOND STAGE;  Surgeon: Hamilton DELENA Gaston, MD;  Location: [facility];  Service: Urology;  Laterality: N/A;   LOWER LEG SOFT TISSUE TUMOR EXCISION  1994   cyst left ankle -involved muscle removed-limited mobility now   NASAL SEPTUM SURGERY     PUBOVAGINAL SLING N/A 06/04/2019   Procedure: CARLOYN GLADE;  Surgeon: Gaston Hamilton, MD;  Location: [facility];  Service: Urology;  Laterality: N/A;    Prior to Admission medications   Medication Sig Start Date End Date Taking? Authorizing Provider  acetaminophen  (TYLENOL ) 650 MG CR tablet Take 1,300 mg by mouth in the morning and at  bedtime.    [provider]  albuterol  (PROVENTIL  HFA;VENTOLIN  HFA) 108 (90 Base) MCG/ACT inhaler Inhale 2 puffs into the lungs every 6 (six) hours as needed for wheezing or shortness of breath.    [provider]  atorvastatin  (LIPITOR) 40 MG tablet Take 1 tablet (40 mg total) by mouth daily. 03/30/23 08/03/23  Vannie Reche RAMAN, NP  benzonatate (TESSALON) 200 MG capsule Take 200 mg by mouth as needed. 04/18/22   [provider]  clotrimazole-betamethasone  (LOTRISONE) cream Apply 1 Application topically 2 (two) times daily as needed (for irritation). 12/15/21   [provider]  diclofenac Sodium (VOLTAREN) 1 % GEL as needed (knee pain).    [provider]  docusate sodium  (COLACE) 100 MG capsule Take 100 mg by mouth as needed for mild constipation.    [provider]  dronedarone  (MULTAQ ) 400 MG tablet Take 1 tablet (400 mg total) by mouth 2 (two) times daily with a meal. 08/03/23   Walker, Caitlin S, NP  fluticasone  (FLOVENT  HFA) 110 MCG/ACT inhaler Inhale 2 puffs into the lungs 2 (two) times daily as needed (for seasonal flares/asthma). Patient not taking: Reported on 08/03/2023    [provider]  gabapentin  (NEURONTIN ) 300 MG capsule daily at 6 (six) AM. Take 300 mg in the morning 300mg  noon, and 900 mg at night 03/22/23  [provider]  hydrocortisone  (ANUSOL -HC) 25 MG suppository Place 25 mg rectally as needed for hemorrhoids or anal itching.    [provider]  hyoscyamine  (LEVSIN  SL) 0.125 MG SL tablet Place 0.125 mg under the tongue 2 (two) times daily as needed for cramping. 11/17/21   [provider]  metoprolol  succinate (TOPROL  XL) 25 MG 24 hr tablet Take 0.5 tablets (12.5 mg total) by mouth at bedtime. STOP lopressor  02/20/23 02/20/24  Fenton, Clint R, PA  Na Sulfate-K Sulfate-Mg Sulfate concentrate 17.5-3.13-1.6 GM/177ML SOLN as directed. Colnoscopy prep 08/01/23   [provider]  omeprazole  (PRILOSEC) 40 MG capsule Take 40 mg by mouth daily before breakfast.    [provider]  rivaroxaban  (XARELTO ) 20 MG TABS tablet TAKE 1 TABLET (20 MG TOTAL) BY MOUTH DAILY WITH SUPPER. 05/23/23   Raford Riggs, MD  sertraline  (ZOLOFT ) 100 MG tablet Take 200 mg by mouth daily. 04/29/22   [provider]  sucralfate  (CARAFATE ) 1 g tablet Take 1 g by mouth daily as needed (to coat the stomach). 11/16/21   [provider]  triamcinolone  (KENALOG ) 0.1 % paste 1 Application 2 (two) times daily as needed (for fever blisters- apply to the lips).    [provider]  valsartan -hydrochlorothiazide  (DIOVAN -HCT) 320-25 MG tablet Take 1 tablet by mouth daily. 11/07/22   Raford Riggs, MD    Allergies as of 08/01/2023 - Review Complete 08/01/2023  Allergen Reaction Noted   Shellfish allergy Anaphylaxis, Nausea Only, Swelling, and Other (See Comments) 05/23/2019   Sulfa antibiotics Hives 04/14/2022   Erythromycin Hives 10/26/2021   Ibuprofen Other (See Comments)    Adhesive [tape] Rash and Other (See Comments) 04/06/2012   Amlodipine Hives, Swelling, and Rash 10/17/2017   Betadine [povidone iodine] Rash and Other (See Comments) 01/07/2015   Povidone-iodine Rash and Other (See Comments) 01/07/2015   Silicone Rash and Other (See Comments) 04/06/2012    Family History  Problem Relation Age of Onset   Asthma Mother    Diabetes Mother    Hernia Mother    Obesity Mother    Heart disease Father    Prostate cancer Father    Atrial fibrillation Brother     Social History   Socioeconomic History   Marital status: Married    Spouse name: Chief Operating Officer   Number of children: 3   Years of education: Not on file   Highest education level: Not on file  Occupational History   Occupation: Engineer, Building Services    Occupation: Retired from [employer]  Tobacco Use   Smoking status: Never    Passive exposure: Never   Smokeless tobacco: Never  Vaping Use   Vaping  status: Never Used  Substance and Sexual Activity   Alcohol use: No   Drug use: No   Sexual activity: Not on file  Other Topics Concern   Not on file  Social History Narrative   Not on file   Social Drivers of Health   Financial Resource Strain: Not on file  Food Insecurity: No Food Insecurity (11/07/2022)   Hunger Vital Sign    Worried About Running Out of Food in the Last Year: Never true    Ran Out of Food in the Last Year: Never true  Transportation Needs: No Transportation Needs (11/07/2022)   PRAPARE - Administrator, Civil Service (Medical): No    Lack of Transportation (Non-Medical): No  Physical Activity: Inactive (11/07/2022)   Exercise Vital Sign    Days of  Exercise per Week: 0 days    Minutes of Exercise per Session: 0 min  Stress: Not on file  Social Connections: Not on file  Intimate Partner Violence: Not on file    Review of Systems: See HPI, otherwise negative ROS  Physical Exam: There were no vitals taken for this visit. General:   Alert,  pleasant and cooperative in NAD Head:  Normocephalic and atraumatic. Neck:  Supple; no masses or thyromegaly. Lungs:  Clear throughout to auscultation, normal respiratory effort.    Heart:  +S1, +S2, Regular rate and rhythm, No edema. Abdomen:  Soft, nontender and nondistended. Normal bowel sounds, without guarding, and without rebound.   Neurologic:  Alert and  oriented x4;  grossly normal neurologically.  Impression/Plan: Brandy Love is here for an colonoscopy to be performed for rectal bleeding  Risks, benefits, limitations, and alternatives regarding  colonoscopy have been reviewed with the patient.  Questions have been answered.  All parties agreeable.   Ruel Kung, MD  08/16/2023, 10:37 AM

## 2023-08-16 NOTE — Transfer of Care (Signed)
 Immediate Anesthesia Transfer of Care Note  Patient: Brandy Love  Procedure(s) Performed: COLONOSCOPY WITH PROPOFOL  POLYPECTOMY  Patient Location: PACU  Anesthesia Type:General  Level of Consciousness: sedated  Airway & Oxygen Therapy: Patient Spontanous Breathing  Post-op Assessment: Report given to RN and Post -op Vital signs reviewed and stable  Post vital signs: Reviewed and stable  Last Vitals:  Vitals Value Taken Time  BP    Temp    Pulse    Resp    SpO2      Last Pain:  Vitals:   08/16/23 1104  TempSrc: Temporal  PainSc: 0-No pain         Complications: No notable events documented.

## 2023-08-16 NOTE — Anesthesia Preprocedure Evaluation (Signed)
 Anesthesia Evaluation  Patient identified by MRN, date of birth, ID band Patient awake    Reviewed: Allergy & Precautions, NPO status , Patient's Chart, lab work & pertinent test results  History of Anesthesia Complications Negative for: history of anesthetic complications  Airway Mallampati: III  TM Distance: <3 FB Neck ROM: full    Dental  (+) Chipped   Pulmonary neg shortness of breath, asthma    Pulmonary exam normal        Cardiovascular Exercise Tolerance: Good hypertension, (-) angina (-) Past MI Normal cardiovascular exam     Neuro/Psych CVA (left side), Residual Symptoms  negative psych ROS   GI/Hepatic Neg liver ROS, PUD,GERD  Controlled,,  Endo/Other  negative endocrine ROS    Renal/GU negative Renal ROS  negative genitourinary   Musculoskeletal   Abdominal   Peds  Hematology negative hematology ROS (+)   Anesthesia Other Findings Past Medical History: No date: A-fib (HCC) No date: Asthma     Comment:  controlled well No date: Bladder prolapse, female, acquired No date: Edema extremities     Comment:  lower legs at times No date: Gastric ulcer No date: GERD (gastroesophageal reflux disease) No date: Glaucoma No date: High blood pressure No date: OAB (overactive bladder) No date: Pre-diabetes No date: Prediabetes  Past Surgical History: 1993: ABDOMINAL HYSTERECTOMY 06/04/2019: ANTERIOR AND POSTERIOR REPAIR; N/A     Comment:  Procedure: CYSTOSCOPY ANTERIOR (CYSTOCELE);  Surgeon:               Gaston Hamilton, MD;  Location: [facility];  Service:               Urology;  Laterality: N/A; No date: APPENDECTOMY 2016: CATARACT EXTRACTION No date: CYSTECTOMY     Comment:  1994 and 1982 No date: EYE SURGERY 04/10/2012: RENNA IMPLANT PLACEMENT     Comment:  Procedure: INTERSTIM IMPLANT FIRST STAGE;  Surgeon:               Hamilton DELENA Gaston, MD;  Location: [facility];  Service: Urology;  Laterality: N/A;  rad tech ok              per vicki at [facility]  04/10/2012: INTERSTIM IMPLANT PLACEMENT     Comment:  Procedure: INTERSTIM IMPLANT SECOND STAGE;  Surgeon:               Hamilton DELENA Gaston, MD;  [facility];  Service: Urology;  Laterality: N/A; 1994: LOWER LEG SOFT TISSUE TUMOR EXCISION     Comment:  cyst left ankle -involved muscle removed-limited               mobility now No date: NASAL SEPTUM SURGERY 06/04/2019: PUBOVAGINAL SLING; N/A     Comment:  Procedure: CARLOYN GLADE;  Surgeon: Gaston Hamilton, MD;  Location: [facility];  Service: Urology;                Laterality: N/A;     Reproductive/Obstetrics negative OB ROS                             Anesthesia Physical Anesthesia Plan  ASA: 3  Anesthesia Plan: General  Post-op Pain Management:    Induction: Intravenous  PONV Risk Score and Plan: Propofol  infusion and TIVA  Airway Management Planned: Natural Airway and Nasal Cannula  Additional Equipment:   Intra-op Plan:   Post-operative Plan:   Informed Consent: I have reviewed the patients History and Physical, chart, labs and discussed the procedure including the risks, benefits and alternatives for the proposed anesthesia with the patient or authorized representative who has indicated his/her understanding and acceptance.     Dental Advisory Given  Plan Discussed with: Anesthesiologist, CRNA and Surgeon  Anesthesia Plan Comments: (Patient consented for risks of anesthesia including but not limited to:  - adverse reactions to medications - risk of airway placement if required - damage to eyes, teeth, lips or other oral mucosa - nerve damage due to positioning  - sore throat or hoarseness - Damage to heart, brain, nerves, lungs, other parts of body or loss of life  Patient voiced understanding and assent.)       Anesthesia Quick  Evaluation

## 2023-08-17 ENCOUNTER — Encounter: Payer: Self-pay | Admitting: Gastroenterology

## 2023-08-17 LAB — SURGICAL PATHOLOGY

## 2023-08-17 NOTE — Anesthesia Postprocedure Evaluation (Signed)
 Anesthesia Post Note  Patient: Brandy Love  Procedure(s) Performed: COLONOSCOPY WITH PROPOFOL  POLYPECTOMY  Patient location during evaluation: Endoscopy Anesthesia Type: General Level of consciousness: awake and alert Pain management: pain level controlled Vital Signs Assessment: post-procedure vital signs reviewed and stable Respiratory status: spontaneous breathing, nonlabored ventilation, respiratory function stable and patient connected to nasal cannula oxygen Cardiovascular status: blood pressure returned to baseline and stable Postop Assessment: no apparent nausea or vomiting Anesthetic complications: no   No notable events documented.   Last Vitals:  Vitals:   08/16/23 1155 08/16/23 1205  BP: 127/62 119/68  Pulse: (!) 59 (!) 54  Resp: (!) 21 18  Temp:    SpO2: 95% 97%    Last Pain:  Vitals:   08/16/23 1205  TempSrc:   PainSc: 0-No pain                 Fairy POUR Jeffree Cazeau

## 2023-08-25 ENCOUNTER — Other Ambulatory Visit (HOSPITAL_COMMUNITY): Payer: Self-pay | Admitting: Cardiovascular Disease

## 2023-08-31 ENCOUNTER — Encounter (HOSPITAL_BASED_OUTPATIENT_CLINIC_OR_DEPARTMENT_OTHER): Payer: Medicare Other | Admitting: Family

## 2023-09-04 ENCOUNTER — Encounter: Payer: Self-pay | Admitting: Gastroenterology

## 2023-09-04 ENCOUNTER — Other Ambulatory Visit (HOSPITAL_COMMUNITY): Payer: Self-pay

## 2023-09-04 DIAGNOSIS — I480 Paroxysmal atrial fibrillation: Secondary | ICD-10-CM

## 2023-09-04 MED ORDER — MULTAQ 400 MG PO TABS
400.0000 mg | ORAL_TABLET | Freq: Two times a day (BID) | ORAL | Status: DC
Start: 2023-06-12 — End: 2024-02-02

## 2023-09-10 IMAGING — CT CT ABD-PELV W/ CM
1 of 3 series · 13 of 32 positions shown, 19 images · IV contrast (APPLIED)
Comparison: Right upper quadrant ultrasound April 21, 2017.

CLINICAL DATA: Two years of upper abdominal pain.

EXAM:
CT ABDOMEN AND PELVIS WITH CONTRAST
TECHNIQUE: Multidetector CT imaging of the abdomen and pelvis was performed
using the standard protocol following bolus administration of
intravenous contrast.
Creatinine was obtained on site at [HOSPITAL] at [HOSPITAL].
Results: Creatinine 0.8 mg/dL.
CONTRAST:  100mL [id number] IOPAMIDOL ([id number]) INJECTION 61%

[Series 2: abd/pelvis w/cm · axial · 0.96mm/px · z∈[-462,-42]mm · 13 of 99 slices shown, 19 images]
[im 8/99  soft-tissue]
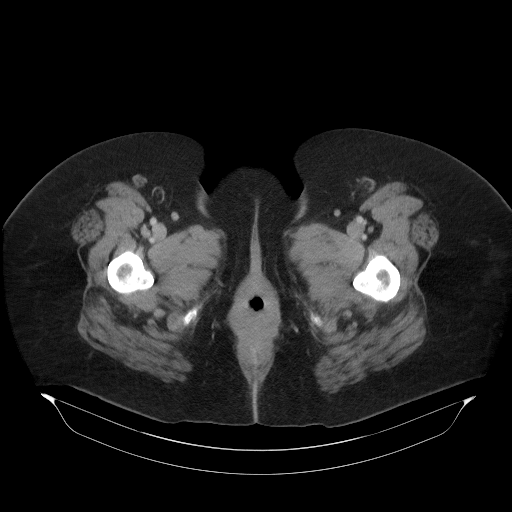
[im 8/99  bone]
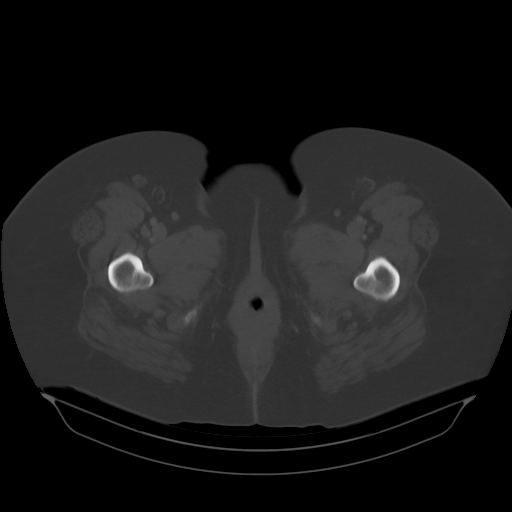
[im 15/99  soft-tissue]
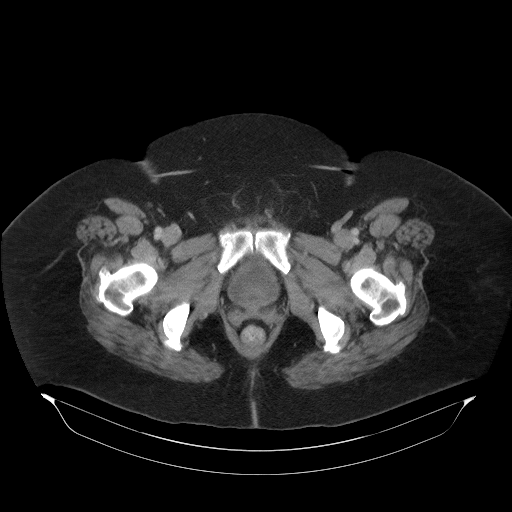
[im 22/99  soft-tissue]
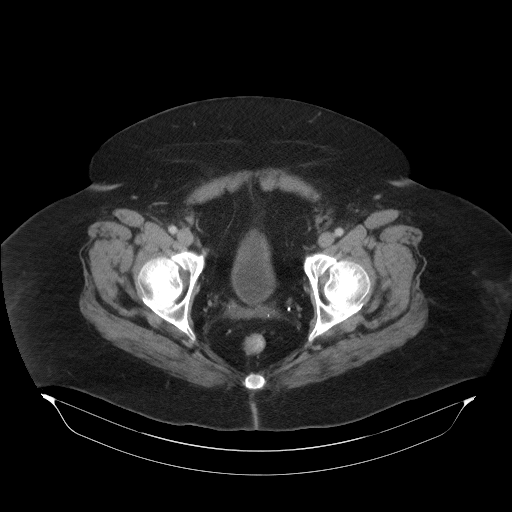
[im 29/99  soft-tissue]
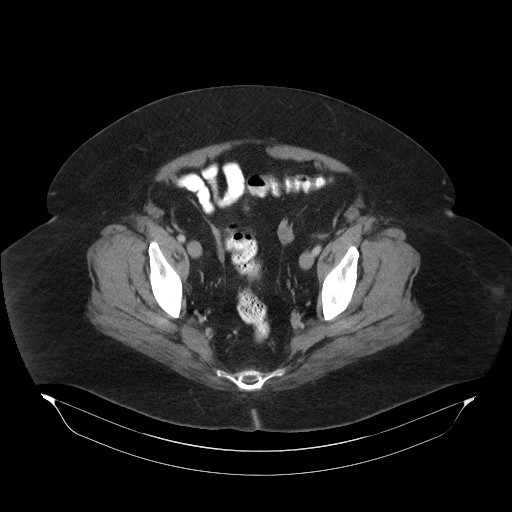
[im 36/99  soft-tissue]
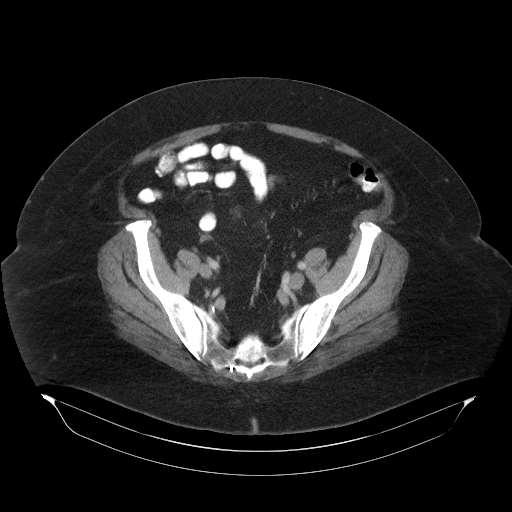
[im 43/99  soft-tissue]
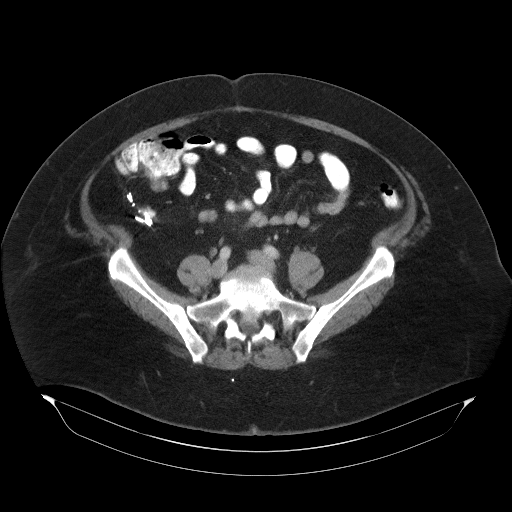
[im 50/99  soft-tissue]
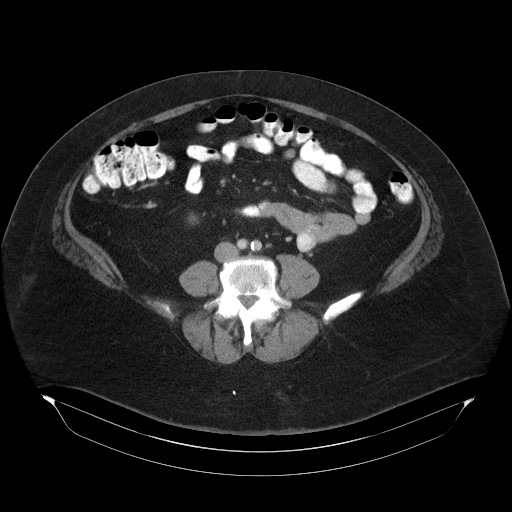
[im 57/99  soft-tissue]
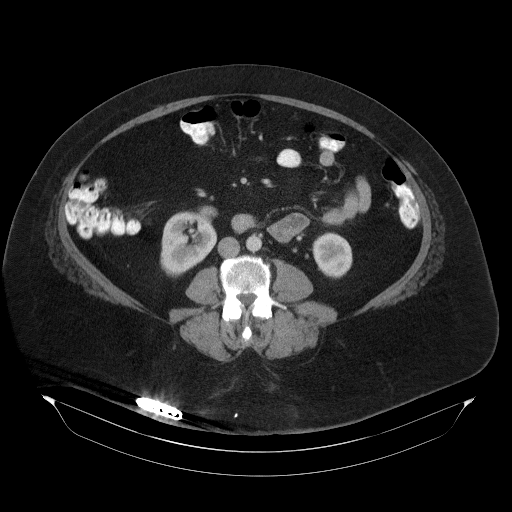
[im 64/99  soft-tissue]
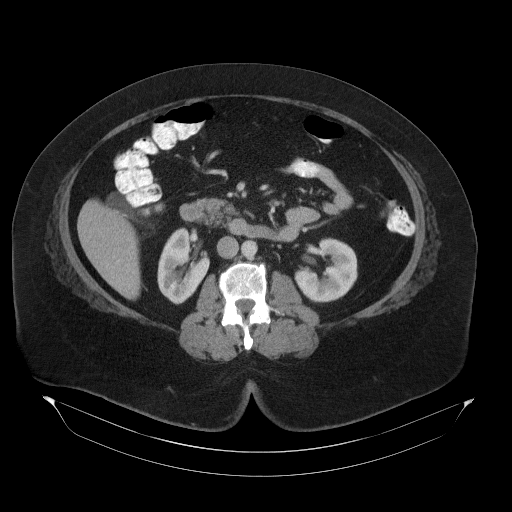
[im 64/99  bone]
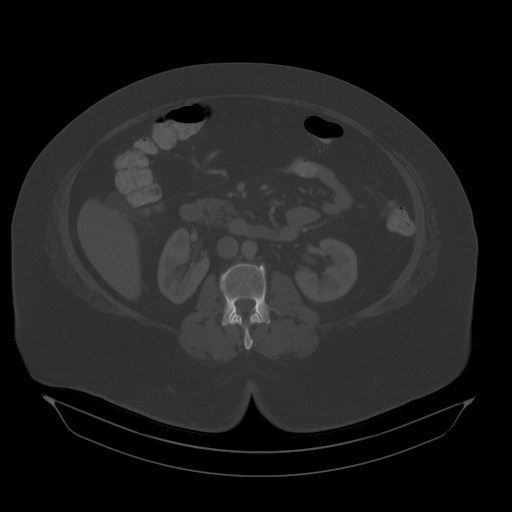
[im 71/99  soft-tissue]
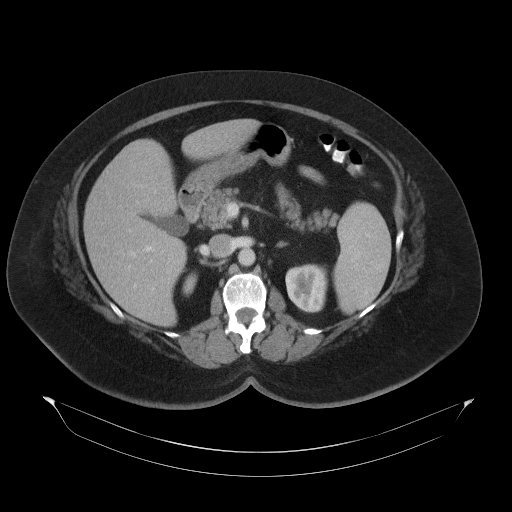
[im 71/99  lung]
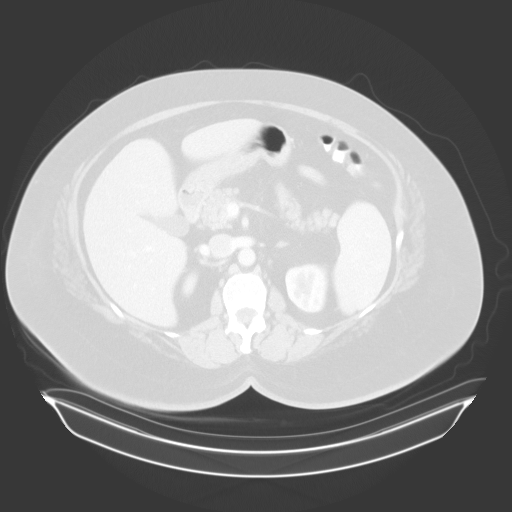
[im 78/99  soft-tissue]
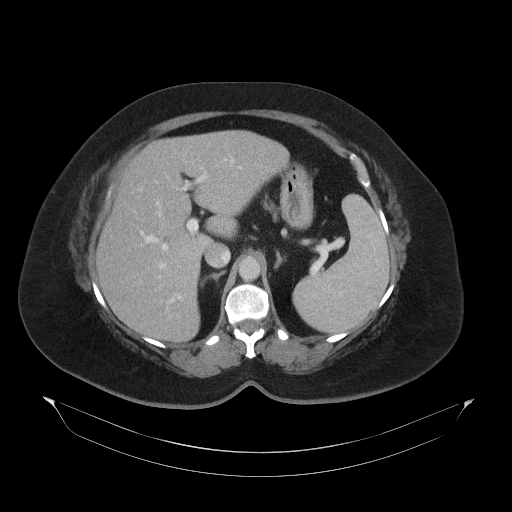
[im 78/99  lung]
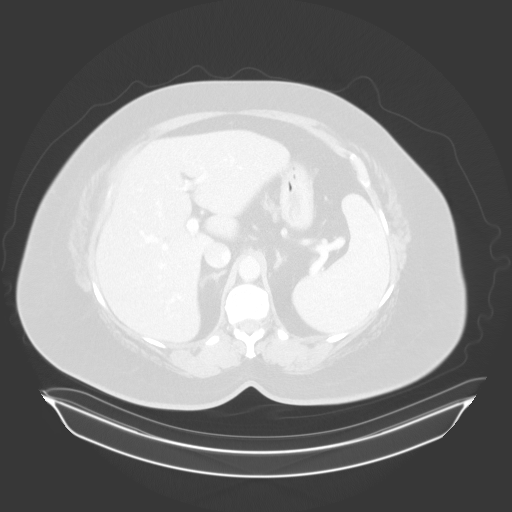
[im 85/99  soft-tissue]
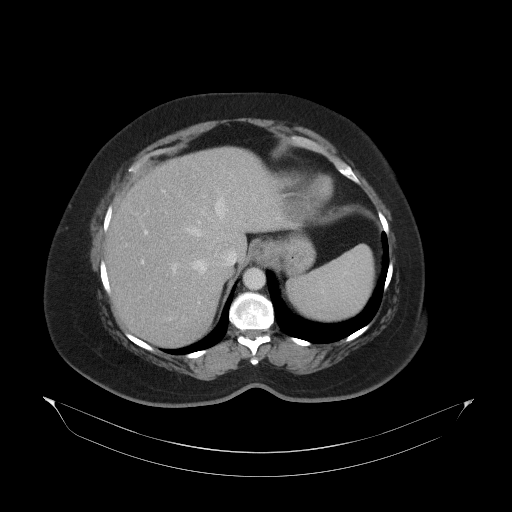
[im 85/99  lung]
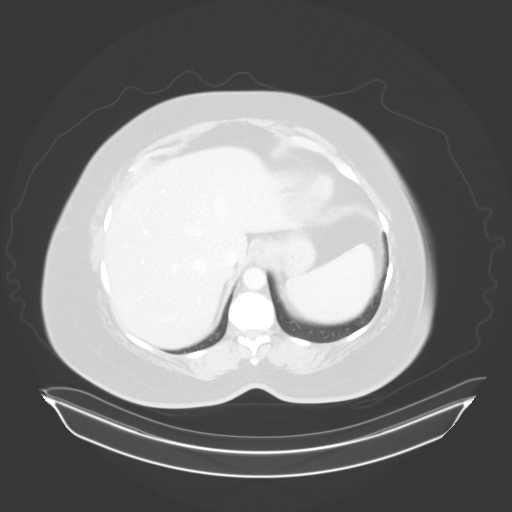
[im 92/99  soft-tissue]
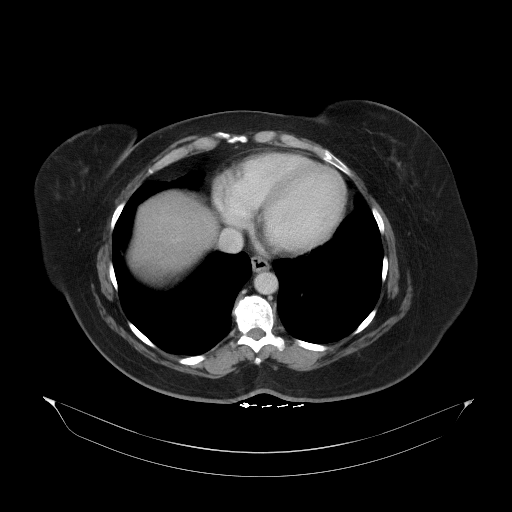
[im 92/99  lung]
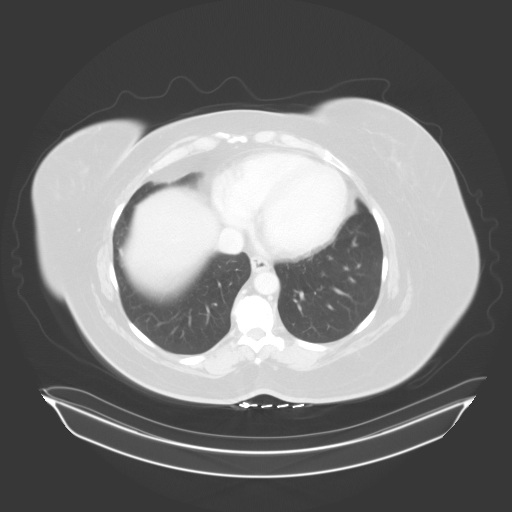

[13 of 32 positions shown; findings below may reference images not displayed]

FINDINGS: Lower chest: No acute abnormality.

Hepatobiliary: Diffuse hepatic steatosis. No suspicious hepatic
lesion. Gallbladder is unremarkable. No biliary ductal dilation.

Pancreas: Within normal limits.

Spleen: Within normal limits.

Adrenals/Urinary Tract: Adrenal glands are unremarkable. Kidneys are
normal, without renal calculi, solid enhancing lesion, or
hydronephrosis. Bladder is unremarkable for degree of distension.

Stomach/Bowel: Small hiatal hernia otherwise the stomach is
unremarkable. Radiopaque enteric contrast traverses the rectum. No
pathologic dilation of small or large bowel. The appendix appears
surgically absent. Terminal ileum is unremarkable. No evidence of
acute bowel inflammation.

Vascular/Lymphatic: Scattered aortic atherosclerosis without
aneurysmal dilation. No pathologically enlarged abdominal or pelvic
lymph nodes.

Reproductive: Status post hysterectomy. No adnexal masses.

Other: No significant abdominopelvic free fluid.

Musculoskeletal: Stimulating generator in the right posterior is
paraspinal subcutaneous soft tissues with lead in the presacral soft
tissues. Mild multilevel degenerative changes spine. No acute
osseous abnormality.
IMPRESSION: 1. No acute findings in the abdomen or pelvis.
2. Diffuse hepatic steatosis.
3. Small hiatal hernia.
4. Aortic Atherosclerosis ([id number]).

## 2023-09-25 ENCOUNTER — Other Ambulatory Visit: Payer: Self-pay | Admitting: Surgery

## 2023-09-26 ENCOUNTER — Inpatient Hospital Stay: Admission: RE | Admit: 2023-09-26 | Source: Ambulatory Visit

## 2023-09-27 ENCOUNTER — Other Ambulatory Visit: Payer: Self-pay

## 2023-09-27 ENCOUNTER — Encounter
Admission: RE | Admit: 2023-09-27 | Discharge: 2023-09-27 | Disposition: A | Discharge status: Home / Self Care | Source: Ambulatory Visit | Attending: Surgery | Admitting: Surgery

## 2023-09-27 VITALS — BP 122/57 | HR 64 | Resp 16 | Ht 67.0 in | Wt 238.8 lb

## 2023-09-27 DIAGNOSIS — Z01812 Encounter for preprocedural laboratory examination: Secondary | ICD-10-CM

## 2023-09-27 DIAGNOSIS — Z01810 Encounter for preprocedural cardiovascular examination: Secondary | ICD-10-CM | POA: Diagnosis not present

## 2023-09-27 DIAGNOSIS — Z01818 Encounter for other preprocedural examination: Secondary | ICD-10-CM | POA: Diagnosis present

## 2023-09-27 HISTORY — DX: Headache, unspecified: R51.9

## 2023-09-27 HISTORY — DX: Cardiac arrhythmia, unspecified: I49.9

## 2023-09-27 HISTORY — DX: Anemia, unspecified: D64.9

## 2023-09-27 HISTORY — DX: Nausea with vomiting, unspecified: Z98.890

## 2023-09-27 HISTORY — DX: Depression, unspecified: F32.A

## 2023-09-27 HISTORY — DX: Anxiety disorder, unspecified: F41.9

## 2023-09-27 HISTORY — DX: Pneumonia, unspecified organism: J18.9

## 2023-09-27 HISTORY — DX: Other specified postprocedural states: R11.2

## 2023-09-27 LAB — CBC WITH DIFFERENTIAL/PLATELET
Abs Immature Granulocytes: 0.01 10*3/uL (ref 0.00–0.07)
Basophils Absolute: 0.1 10*3/uL (ref 0.0–0.1)
Basophils Relative: 1 %
Eosinophils Absolute: 0.3 10*3/uL (ref 0.0–0.5)
Eosinophils Relative: 6 %
HCT: 36.9 % (ref 36.0–46.0)
Hemoglobin: 12.1 g/dL (ref 12.0–15.0)
Immature Granulocytes: 0 %
Lymphocytes Relative: 41 %
Lymphs Abs: 2.4 10*3/uL (ref 0.7–4.0)
MCH: 28.5 pg (ref 26.0–34.0)
MCHC: 32.8 g/dL (ref 30.0–36.0)
MCV: 87 fL (ref 80.0–100.0)
Monocytes Absolute: 0.5 10*3/uL (ref 0.1–1.0)
Monocytes Relative: 8 %
Neutro Abs: 2.6 10*3/uL (ref 1.7–7.7)
Neutrophils Relative %: 44 %
Platelets: 195 10*3/uL (ref 150–400)
RBC: 4.24 MIL/uL (ref 3.87–5.11)
RDW: 13.2 % (ref 11.5–15.5)
WBC: 5.9 10*3/uL (ref 4.0–10.5)
nRBC: 0 % (ref 0.0–0.2)

## 2023-09-27 LAB — COMPREHENSIVE METABOLIC PANEL
ALT: 16 U/L (ref 0–44)
AST: 19 U/L (ref 15–41)
Albumin: 4 g/dL (ref 3.5–5.0)
Alkaline Phosphatase: 52 U/L (ref 38–126)
Anion gap: 8 (ref 5–15)
BUN: 16 mg/dL (ref 8–23)
CO2: 31 mmol/L (ref 22–32)
Calcium: 9.5 mg/dL (ref 8.9–10.3)
Chloride: 103 mmol/L (ref 98–111)
Creatinine, Ser: 1.12 mg/dL — ABNORMAL HIGH (ref 0.44–1.00)
GFR, Estimated: 54 mL/min — ABNORMAL LOW (ref >60)
Glucose, Bld: 110 mg/dL — ABNORMAL HIGH (ref 70–99)
Potassium: 4 mmol/L (ref 3.5–5.1)
Sodium: 142 mmol/L (ref 135–145)
Total Bilirubin: 0.6 mg/dL (ref 0.0–1.2)
Total Protein: 7.4 g/dL (ref 6.5–8.1)

## 2023-09-27 LAB — URINALYSIS, ROUTINE W REFLEX MICROSCOPIC
Bilirubin Urine: NEGATIVE
Glucose, UA: NEGATIVE mg/dL
Hgb urine dipstick: NEGATIVE
Ketones, ur: NEGATIVE mg/dL
Leukocytes,Ua: NEGATIVE
Nitrite: NEGATIVE
Protein, ur: NEGATIVE mg/dL
Specific Gravity, Urine: 1.028 (ref 1.005–1.030)
pH: 5 (ref 5.0–8.0)

## 2023-09-27 LAB — SURGICAL PCR SCREEN
MRSA, PCR: NEGATIVE
Staphylococcus aureus: NEGATIVE

## 2023-09-27 NOTE — Patient Instructions (Addendum)
 Your procedure is scheduled on: 10/05/23 - Thursday Report to the Registration Desk on the 1st floor of the [facility]. To find out your arrival time, please call [phone] between 1PM - 3PM on: 10/04/23 - Wednesday If your arrival time is 6:00 am, do not arrive before that time as the [facility] entrance doors do not open until 6:00 am.  REMEMBER: Instructions that are not followed completely may result in serious medical risk, up to and including death; or upon the discretion of your surgeon and anesthesiologist your surgery may need to be rescheduled.  Do not eat food after midnight the night before surgery.  No gum chewing or hard candies.  You may however, drink CLEAR liquids up to 2 hours before you are scheduled to arrive for your surgery. Do not drink anything within 2 hours of your scheduled arrival time.  Clear liquids include: - water  - apple juice without pulp - gatorade (not RED colors) - black coffee or tea (Do NOT add milk or creamers to the coffee or tea) Do NOT drink anything that is not on this list.  In addition, your doctor has ordered for you to drink the provided:  Ensure Pre-Surgery Clear Carbohydrate Drink  Drinking this carbohydrate drink up to two hours before surgery helps to reduce insulin resistance and improve patient outcomes. Please complete drinking 2 hours before scheduled arrival time.  One week prior to surgery: Stop Anti-inflammatories (NSAIDS) such as Advil, Aleve, Ibuprofen, Motrin, Naproxen, Naprosyn and Aspirin based products such as Excedrin, Goody's Powder, BC Powder. You may take Tylenol if needed for pain up until the day of surgery.  Stop ANY OVER THE COUNTER supplements until after surgery.  HOLD rivaroxaban (XARELTO) beginning 10/02/23.  Hold valsartan-hydrochlorothiazide on the day of surgery.  ON THE DAY OF SURGERY ONLY TAKE THESE MEDICATIONS WITH SIPS OF WATER:  dronedarone (MULTAQ)  gabapentin (NEURONTIN)  omeprazole  (PRILOSEC)  sertraline (ZOLOFT)   Use inhalers on the day of surgery and bring to the hospital.   No Alcohol for 24 hours before or after surgery.  No Smoking including e-cigarettes for 24 hours before surgery.  No chewable tobacco products for at least 6 hours before surgery.  No nicotine patches on the day of surgery.  Do not use any "recreational" drugs for at least a week (preferably 2 weeks) before your surgery.  Please be advised that the combination of cocaine and anesthesia may have negative outcomes, up to and including death. If you test positive for cocaine, your surgery will be cancelled.  On the morning of surgery brush your teeth with toothpaste and water, you may rinse your mouth with mouthwash if you wish. Do not swallow any toothpaste or mouthwash.  Use CHG Soap or wipes as directed on instruction sheet.  Do not wear jewelry, make-up, hairpins, clips or nail polish.  For welded (permanent) jewelry: bracelets, anklets, waist bands, etc.  Please have this removed prior to surgery.  If it is not removed, there is a chance that hospital personnel will need to cut it off on the day of surgery.  Do not wear lotions, powders, or perfumes.   Do not shave body hair from the neck down 48 hours before surgery.  Contact lenses, hearing aids and dentures may not be worn into surgery.  Do not bring valuables to the hospital. [facility] is not responsible for any missing/lost belongings or valuables.   Notify your doctor if there is any change in your medical  condition (cold, fever, infection).  Wear comfortable clothing (specific to your surgery type) to the hospital.  After surgery, you can help prevent lung complications by doing breathing exercises.  Take deep breaths and cough every 1-2 hours. Your doctor may order a device called an Incentive Spirometer to help you take deep breaths. When coughing or sneezing, hold a pillow firmly against your incision with both hands.  This is called "splinting." Doing this helps protect your incision. It also decreases belly discomfort.  If you are being admitted to the hospital overnight, leave your suitcase in the car. After surgery it may be brought to your room.  In case of increased patient census, it may be necessary for you, the patient, to continue your postoperative care in the Same Day Surgery department.  If you are being discharged the day of surgery, you will not be allowed to drive home. You will need a responsible individual to drive you home and stay with you for 24 hours after surgery.   If you are taking public transportation, you will need to have a responsible individual with you.  Please call the Pre-admissions Testing Dept. at [phone] if you have any questions about these instructions.  Surgery Visitation Policy:  Patients having surgery or a procedure may have two visitors.  Children under the age of 77 must have an adult with them who is not the patient.  Temporary Visitor Restrictions Due to increasing cases of flu, RSV and COVID-19: Children ages 73 and under will not be able to visit patients in [facility] hospitals under most circumstances.  Inpatient Visitation:    Visiting hours are 7 a.m. to 8 p.m. Up to four visitors are allowed at one time in a patient room. The visitors may rotate out with other people during the day.  One visitor age 45 or older may stay with the patient overnight and must be in the room by 8 p.m.     Pre-operative 5 CHG Bath Instructions   You can play a key role in reducing the risk of infection after surgery. Your skin needs to be as free of germs as possible. You can reduce the number of germs on your skin by washing with CHG (chlorhexidine gluconate) soap before surgery. CHG is an antiseptic soap that kills germs and continues to kill germs even after washing.   DO NOT use if you have an allergy to chlorhexidine/CHG or antibacterial soaps. If your  skin becomes reddened or irritated, stop using the CHG and notify one of our RNs at [phone].   Please shower with the CHG soap starting 4 days before surgery using the following schedule: 03/23 - 03/27.    Please keep in mind the following:  DO NOT shave, including legs and underarms, starting the day of your first shower.   You may shave your face at any point before/day of surgery.  Place clean sheets on your bed the day you start using CHG soap. Use a clean washcloth (not used since being washed) for each shower. DO NOT sleep with pets once you start using the CHG.   CHG Shower Instructions:  If you choose to wash your hair and private area, wash first with your normal shampoo/soap.  After you use shampoo/soap, rinse your hair and body thoroughly to remove shampoo/soap residue.  Turn the water OFF and apply about 3 tablespoons (45 ml) of CHG soap to a CLEAN washcloth.  Apply CHG soap ONLY FROM YOUR NECK DOWN TO YOUR  TOES (washing for 3-5 minutes)  DO NOT use CHG soap on face, private areas, open wounds, or sores.  Pay special attention to the area where your surgery is being performed.  If you are having back surgery, having someone wash your back for you may be helpful. Wait 2 minutes after CHG soap is applied, then you may rinse off the CHG soap.  Pat dry with a clean towel  Put on clean clothes/pajamas   If you choose to wear lotion, please use ONLY the CHG-compatible lotions on the back of this paper.     Additional instructions for the day of surgery: DO NOT APPLY any lotions, deodorants, cologne, or perfumes.   Put on clean/comfortable clothes.  Brush your teeth.  Ask your nurse before applying any prescription medications to the skin.      CHG Compatible Lotions   Aveeno Moisturizing lotion  Cetaphil Moisturizing Cream  Cetaphil Moisturizing Lotion  Clairol Herbal Essence Moisturizing Lotion, Dry Skin  Clairol Herbal Essence Moisturizing Lotion, Extra Dry  Skin  Clairol Herbal Essence Moisturizing Lotion, Normal Skin  Curel Age Defying Therapeutic Moisturizing Lotion with Alpha Hydroxy  Curel Extreme Care Body Lotion  Curel Soothing Hands Moisturizing Hand Lotion  Curel Therapeutic Moisturizing Cream, Fragrance-Free  Curel Therapeutic Moisturizing Lotion, Fragrance-Free  Curel Therapeutic Moisturizing Lotion, Original Formula  Eucerin Daily Replenishing Lotion  Eucerin Dry Skin Therapy Plus Alpha Hydroxy Crme  Eucerin Dry Skin Therapy Plus Alpha Hydroxy Lotion  Eucerin Original Crme  Eucerin Original Lotion  Eucerin Plus Crme Eucerin Plus Lotion  Eucerin TriLipid Replenishing Lotion  Keri Anti-Bacterial Hand Lotion  Keri Deep Conditioning Original Lotion Dry Skin Formula Softly Scented  Keri Deep Conditioning Original Lotion, Fragrance Free Sensitive Skin Formula  Keri Lotion Fast Absorbing Fragrance Free Sensitive Skin Formula  Keri Lotion Fast Absorbing Softly Scented Dry Skin Formula  Keri Original Lotion  Keri Skin Renewal Lotion Keri Silky Smooth Lotion  Keri Silky Smooth Sensitive Skin Lotion  Nivea Body Creamy Conditioning Oil  Nivea Body Extra Enriched Lotion  Nivea Body Original Lotion  Nivea Body Sheer Moisturizing Lotion Nivea Crme  Nivea Skin Firming Lotion  NutraDerm 30 Skin Lotion  NutraDerm Skin Lotion  NutraDerm Therapeutic Skin Cream  NutraDerm Therapeutic Skin Lotion  ProShield Protective Hand Cream  Provon moisturizing lotion How to Use an Incentive Spirometer  An incentive spirometer is a tool that measures how well you are filling your lungs with each breath. Learning to take long, deep breaths using this tool can help you keep your lungs clear and active. This may help to reverse or lessen your chance of developing breathing (pulmonary) problems, especially infection. You may be asked to use a spirometer: After a surgery. If you have a lung problem or a history of smoking. After a long period of time  when you have been unable to move or be active. If the spirometer includes an indicator to show the highest number that you have reached, your health care provider or respiratory therapist will help you set a goal. Keep a log of your progress as told by your health care provider. What are the risks? Breathing too quickly may cause dizziness or cause you to pass out. Take your time so you do not get dizzy or light-headed. If you are in pain, you may need to take pain medicine before doing incentive spirometry. It is harder to take a deep breath if you are having pain. How to use your incentive spirometer  Sit up  on the edge of your bed or on a chair. Hold the incentive spirometer so that it is in an upright position. Before you use the spirometer, breathe out normally. Place the mouthpiece in your mouth. Make sure your lips are closed tightly around it. Breathe in slowly and as deeply as you can through your mouth, causing the piston or the ball to rise toward the top of the chamber. Hold your breath for 3-5 seconds, or for as long as possible. If the spirometer includes a coach indicator, use this to guide you in breathing. Slow down your breathing if the indicator goes above the marked areas. Remove the mouthpiece from your mouth and breathe out normally. The piston or ball will return to the bottom of the chamber. Rest for a few seconds, then repeat the steps 10 or more times. Take your time and take a few normal breaths between deep breaths so that you do not get dizzy or light-headed. Do this every 1-2 hours when you are awake. If the spirometer includes a goal marker to show the highest number you have reached (best effort), use this as a goal to work toward during each repetition. After each set of 10 deep breaths, cough a few times. This will help to make sure that your lungs are clear. If you have an incision on your chest or abdomen from surgery, place a pillow or a rolled-up towel  firmly against the incision when you cough. This can help to reduce pain while taking deep breaths and coughing. General tips When you are able to get out of bed: Walk around often. Continue to take deep breaths and cough in order to clear your lungs. Keep using the incentive spirometer until your health care provider says it is okay to stop using it. If you have been in the hospital, you may be told to keep using the spirometer at home. Contact a health care provider if: You are having difficulty using the spirometer. You have trouble using the spirometer as often as instructed. Your pain medicine is not giving enough relief for you to use the spirometer as told. You have a fever. Get help right away if: You develop shortness of breath. You develop a cough with bloody mucus from the lungs. You have fluid or blood coming from an incision site after you cough. Summary An incentive spirometer is a tool that can help you learn to take long, deep breaths to keep your lungs clear and active. You may be asked to use a spirometer after a surgery, if you have a lung problem or a history of smoking, or if you have been inactive for a long period of time. Use your incentive spirometer as instructed every 1-2 hours while you are awake. If you have an incision on your chest or abdomen, place a pillow or a rolled-up towel firmly against your incision when you cough. This will help to reduce pain. Get help right away if you have shortness of breath, you cough up bloody mucus, or blood comes from your incision when you cough. This information is not intended to replace advice given to you by your health care provider. Make sure you discuss any questions you have with your health care provider. Document Revised: 09/16/2019 Document Reviewed: 09/16/2019 Elsevier Patient Education  2023 [employer].    Preoperative Educational Videos for Total Hip, Knee and Shoulder Replacements  To better prepare for  surgery, please view our videos that explain the physical activity and discharge  planning required to have the best surgical recovery at [facility]  [email or web address]  Questions? Call [phone] or email [email or web address]    POLAR CARE INFORMATION  MassAdvertisement.it  How to use [facility] Polar Care [facility] Therapy [facility]?  YouTube   [email or web address].co.uk  OPERATING INSTRUCTIONS  Start the product With dry hands, connect the transformer to the electrical connection located on the top of the cooler. Next, plug the transformer into an appropriate electrical outlet. The unit will automatically start running at this point.  To stop the pump, disconnect electrical power.  Unplug to stop the product when not in use. Unplugging the Polar Care unit turns it off. Always unplug immediately after use. Never leave it plugged in while unattended. Remove pad.    FIRST ADD WATER TO FILL LINE, THEN ICE---Replace ice when existing ice is almost melted  1 Discuss Treatment with your Licensed Health Care Practitioner and Use Only as Prescribed 2 Apply Insulation Barrier & Cold Therapy Pad 3 Check for Moisture 4 Inspect Skin Regularly  Tips and Trouble Shooting Usage Tips 1. Use cubed or chunked ice for optimal performance. 2. It is recommended to drain the Pad between uses. To drain the pad, hold the Pad upright with the hose pointed toward the ground. Depress the black plunger and allow water to drain out. 3. You may disconnect the Pad from the unit without removing the pad from the affected area by depressing the silver tabs on the hose coupling and gently pulling the hoses apart. The Pad and unit will seal itself and will not leak. Note: Some dripping during release is normal. 4. DO NOT RUN PUMP WITHOUT WATER! The pump in this unit is designed to run with water. Running the unit without  water will cause permanent damage to the pump. 5. Unplug unit before removing lid.  TROUBLESHOOTING GUIDE Pump not running, Water not flowing to the pad, Pad is not getting cold 1. Make sure the transformer is plugged into the wall outlet. 2. Confirm that the ice and water are filled to the indicated levels. 3. Make sure there are no kinks in the pad. 4. Gently pull on the blue tube to make sure the tube/pad junction is straight. 5. Remove the pad from the treatment site and ll it while the pad is lying at; then reapply. 6. Confirm that the pad couplings are securely attached to the unit. Listen for the double clicks (Figure 1) to confirm the pad couplings are securely attached.  Leaks    Note: Some condensation on the lines, controller, and pads is unavoidable, especially in warmer climates. 1. If using a Breg Polar Care Cold Therapy unit with a detachable Cold Therapy Pad, and a leak exists (other than condensation on the lines) disconnect the pad couplings. Make sure the silver tabs on the couplings are depressed before reconnecting the pad to the pump hose; then confirm both sides of the coupling are properly clicked in. 2. If the coupling continues to leak or a leak is detected in the pad itself, stop using it and call [employer] Customer Care at [phone].  Cleaning After use, empty and dry the unit with a soft cloth. Warm water and mild detergent may be used occasionally to clean the pump and tubes.  WARNING: The Polar Care Cube can be cold enough to cause serious injury, including full skin necrosis. Follow these Operating Instructions, and carefully read the Product Insert (see pouch on side of unit) and the Cold Therapy Pad  Fitting Instructions (provided with each Cold Therapy Pad) prior to use.

## 2023-09-29 ENCOUNTER — Encounter: Payer: Self-pay | Admitting: Surgery

## 2023-09-29 NOTE — Progress Notes (Signed)
 Perioperative / Anesthesia Services  Pre-Admission Testing Clinical Review / Pre-Operative Anesthesia Consult  Date: 10/02/23  Patient Demographics:  Name: Brandy Love DOB: [birth year] MRN:   [id number]  Planned Surgical Procedure(s):    Case: [id number] Date/Time: 10/05/23 0715   Procedure: ARTHROPLASTY, KNEE, TOTAL (Right: Knee)   Anesthesia type: Choice   Diagnosis: Primary osteoarthritis of right knee [M17.11]   Pre-op diagnosis: PRIMARY OSTEOARTHRITIS OF RIGHT KNEE.   Location: [facility] OR ROOM 02 / [facility]   Surgeons: Christena Flake, MD      NOTE: Available PAT nursing documentation and vital signs have been reviewed. Clinical nursing staff has updated patient's PMH/PSHx, current medication list, and drug allergies/intolerances to ensure comprehensive history available to assist in medical decision making as it pertains to the aforementioned surgical procedure and anticipated anesthetic course. Extensive review of available clinical information personally performed. Thornton PMH and PSHx updated with any diagnoses/procedures that  may have been inadvertently omitted during his intake with the pre-admission testing department's nursing staff.  Clinical Discussion:  Brandy Love is a 68 y.o. female who is submitted for pre-surgical anesthesia review and clearance prior to her undergoing the above procedure. Patient has never been a smoker in the past. Pertinent PMH includes: PAF, PSVT, diastolic dysfunction RIGHT thalamic stroke, cerebral microvascular disease, CVA, multiple TIAs, aortic atherosclerosis, HTN, HLD, prediabetes, asthma, GERD (on daily PPI), hiatal hernia, gastric ulcer, anemia, glaucoma, anxiety, depression.  Patient is followed by cardiology Duke Salvia, MD). She was last seen in the cardiology clinic on 03/30/2023; notes reviewed. At the time of her clinic visit, patient doing well overall from a cardiovascular perspective. Patient denied any chest  pain, shortness of breath, PND, orthopnea, palpitations, significant peripheral edema, weakness, fatigue, vertiginous symptoms, or presyncope/syncope. Patient with a past medical history significant for cardiovascular diagnoses. Documented physical exam was grossly benign, providing no evidence of acute exacerbation and/or decompensation of the patient's known cardiovascular conditions.  Long-term cardiac event monitor study performed on 03/09/2022 revealing a predominant underlying sinus rhythm.  There was a 2% atrial fibrillation burden and a 1.8% SVT burden.  Less than 1% ventricular ectopy noted.  Triggered episodes associated with both sinus rhythm and sinus rhythm with PACs.  Patient suffered a thalamic stroke on 04/14/2022.  MRI imaging of the brain revealed a 6 mm nonhemorrhagic acute/subacute infarct of the lateral RIGHT thalamus.  Following neurological event, patient has no significant residual deficits.  Additionally, patient reporting that as of 09/29/2023, she has experienced at least 8 TIAs in the past.  Most recent TTE performed on 04/15/2022 revealed a normal left ventricular systolic function with an EF of 55-60%. There were no regional wall motion abnormalities. Left ventricular diastolic Doppler parameters consistent with abnormal relaxation (G1DD).left atrium mildly dilated right ventricular size and function normal with a TAPSE measuring 1.9 cm  (normal range >/= 1.6 cm).  There was mild mitral annular calcification.  There was trivial mitral and pulmonary valve regurgitation was observed. All transvalvular gradients were noted to be normal providing no evidence suggestive of valvular stenosis. Aorta normal in size with no evidence of ectasia or aneurysmal dilatation.  Patient with an atrial fibrillation diagnosis; CHA2DS2-VASc Score = 6 (age, sex, HTN, CVA/TIA x 2, vascular disease).Watchman device was discussed, however patient wished to defer procedure at that time.  Her rate and  rhythm are currently being maintained on oral dronabinol + metoprolol. She is chronically anticoagulated using rivaroxaban; reported to be compliant with therapy with no  evidence or reports of GI/GU bleeding.  Blood pressure reasonably controlled at 130/68 mmHg on currently prescribed beta-blocker (metoprolol succinate), ARB (valsartan), and diuretic (HCTZ) therapies. She is on atorvastatin for her HLD diagnosis and further ASCVD prevention.  Patient with a prediabetes diagnosis that is controlled with diet and lifestyle modification.  Last hemoglobin A1c was 5.6% when checked on 04/14/2022. Patient is able to complete all of her  ADL/IADLs without cardiovascular limitation.  Per the DASI, patient is able to achieve at least 4 METS of physical activity without experiencing any significant degree of angina/anginal equivalent symptoms. No changes were made to her medication regimen during her visit with cardiology.  Patient scheduled to follow-up with outpatient cardiology in 6 months or sooner if needed.  Brandy Love is scheduled for an elective ARTHROPLASTY, KNEE, TOTAL (Right: Knee) on 10/04/2021 with Dr. Leron Croak, MD.  Given patient's past medical history significant for cardiovascular diagnoses, presurgical cardiac clearance was sought by the PAT team.  Per cardiology, " according to the Revised Cardiac Risk Index (RCRI), her Perioperative Risk of Major Cardiac Event is (%): 0.9. Her Functional Capacity in METs is: 5.07 according to the [facility] Activity Status Index (DASI). Based ACC/AHA guidelines, the patient's past medical history, and the amount of time since her last clinic visit, this patient would be at an overall ACCEPTABLE risk for the planned procedure without further cardiovascular testing or intervention at this time".   Again, this patient is on daily oral anticoagulation therapy using a DOAC medication.  She has been instructed on recommendations for holding her rivaroxaban for 3 days prior to  her procedure with plans to restart as soon as postoperative bleeding risk felt to be minimized by his attending surgeon. The patient has been instructed that her last dose of rivaroxaban should be on 10/01/2023.  Patient reports previous perioperative complications with anesthesia in the past. Patient has a PMH (+) for PONV. Symptoms and history of PONV will be discussed with patient by anesthesia team on the day of her procedure. Interventions will be ordered as deemed necessary based on patient's individual care needs as determined by anesthesiologist. In review her EMR, it is noted that patient underwent a general anesthetic course here at [facility] (ASA III) in 08/2023 without documented complications.      09/27/2023   10:41 AM 08/16/2023   12:05 PM 08/16/2023   11:55 AM  Vitals with BMI  Height 5\' 7"     Weight 238 lbs 12 oz    BMI 37.39    Systolic 122 119 629  Diastolic 57 68 62  Pulse 64 54 59   Providers/Specialists:  NOTE: Primary physician provider listed below. Patient may have been seen by APP or partner within same practice.   PROVIDER ROLE / SPECIALTY LAST OV  Poggi, Excell Seltzer, MD Orthopedics (Surgeon) 08/18/2023  Noberto Retort, MD Primary Care Provider 06/29/2023  Chilton Si, MD Cardiology 03/30/2023; preop APP call 08/03/2023   Allergies:   Allergies  Allergen Reactions   Shellfish Allergy Anaphylaxis, Nausea Only, Swelling and Other (See Comments)    Swelling of the throat   Sulfa Antibiotics Hives   Erythromycin Hives   Ibuprofen Other (See Comments)    Vertigo   Adhesive [Tape] Rash and Other (See Comments)    Rash with transderm   Amlodipine Hives, Swelling and Rash   Betadine [Povidone Iodine] Rash and Other (See Comments)    ALL TOPICAL IODINES   Povidone-Iodine Rash and  Other (See Comments)    ALL TOPICAL IODINES   Silicone Rash and Other (See Comments)    Rash with transderm   Spironolactone Rash   Current  Home Medications:   No current facility-administered medications for this encounter.    acetaminophen (TYLENOL) 650 MG CR tablet   albuterol (PROVENTIL HFA;VENTOLIN HFA) 108 (90 Base) MCG/ACT inhaler   atorvastatin (LIPITOR) 40 MG tablet   clotrimazole-betamethasone (LOTRISONE) cream   diclofenac Sodium (VOLTAREN) 1 % GEL   dronedarone (MULTAQ) 400 MG tablet   fluticasone (FLOVENT HFA) 110 MCG/ACT inhaler   gabapentin (NEURONTIN) 300 MG capsule   hyoscyamine (LEVSIN SL) 0.125 MG SL tablet   metoprolol succinate (TOPROL-XL) 25 MG 24 hr tablet   omeprazole (PRILOSEC) 40 MG capsule   rivaroxaban (XARELTO) 20 MG TABS tablet   sertraline (ZOLOFT) 100 MG tablet   sucralfate (CARAFATE) 1 g tablet   triamcinolone (KENALOG) 0.1 % paste   valsartan-hydrochlorothiazide (DIOVAN-HCT) 320-25 MG tablet   History:   Past Medical History:  Diagnosis Date   Anemia    Anxiety    Aortic atherosclerosis (HCC)    Asthma    Bladder prolapse, female, acquired    Cerebral microvascular disease    Depression    Diastolic dysfunction    Edema of both lower extremities    Gastric ulcer    GERD (gastroesophageal reflux disease)    Glaucoma    Headache    Hepatic steatosis    Hiatal hernia    HTN (hypertension)    OAB (overactive bladder)    a.) has interstim implant   On dronedarone therapy    On rivaroxaban therapy    Ophthalmoplegic migraine    PAF (paroxysmal atrial fibrillation) (HCC)    a.) CHA2DS2VASc = 6 (age, sex, HTN, CVA/TIA x 2,vascular disease) as of 09/29/2023; b.) rate/rhythm maintained on oral dronaderone + metoprolol succinate; chronically anticoagulated with rivaroxaban   Pneumonia    PONV (postoperative nausea and vomiting)    Pre-diabetes    Primary osteoarthritis of right knee    PSVT (paroxysmal supraventricular tachycardia) (HCC)    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    TIA (transient ischemic attack)    a.) x 8 as of 09/29/2023 per patient report   Past Surgical History:  Procedure Laterality Date   ABDOMINAL HYSTERECTOMY  1993   ANTERIOR AND POSTERIOR REPAIR N/A 06/04/2019   Procedure: CYSTOSCOPY ANTERIOR (CYSTOCELE);  Surgeon: Alfredo Martinez, MD;  Location: [facility];  Service: Urology;  Laterality: N/A;   APPENDECTOMY     CATARACT EXTRACTION  2016   COLONOSCOPY WITH PROPOFOL N/A 08/16/2023   Procedure: COLONOSCOPY WITH PROPOFOL;  Surgeon: Wyline Mood, MD;  Location: [facility];  Service: Gastroenterology;  Laterality: N/A;   cyst opened and drained on ovary     CYSTECTOMY     1994 and 1982   ESOPHAGOGASTRODUODENOSCOPY     EYE SURGERY     INTERSTIM IMPLANT PLACEMENT  04/10/2012   Procedure: Leane Platt IMPLANT FIRST STAGE;  Surgeon: Martina Sinner, MD;  Location: [facility];  Service: Urology;  Laterality: N/A;  rad tech ok per vicki at [facility]    INTERSTIM IMPLANT PLACEMENT  04/10/2012   Procedure: INTERSTIM IMPLANT SECOND STAGE;  Surgeon: Martina Sinner, MD;  Location: [facility];  Service: Urology;  Laterality: N/A;   LOWER LEG SOFT TISSUE TUMOR EXCISION  1994   cyst left  ankle -involved muscle removed-limited mobility now   NASAL SEPTUM SURGERY     POLYPECTOMY  08/16/2023   Procedure: POLYPECTOMY;  Surgeon: Wyline Mood, MD;  Location: [facility];  Service: Gastroenterology;;   PUBOVAGINAL SLING N/A 06/04/2019   Procedure: Leonides Grills;  Surgeon: Alfredo Martinez, MD;  Location: [facility];  Service: Urology;  Laterality: N/A;   Family History  Problem Relation Age of Onset   Asthma Mother    Diabetes Mother    Hernia Mother    Obesity Mother    Heart disease Father    Prostate cancer Father    Atrial fibrillation Brother    Social History   Tobacco Use   Smoking status: Never    Passive exposure: Never   Smokeless tobacco: Never  Substance Use Topics   Alcohol use: No   Pertinent  Clinical Results:  LABS:  Hospital Outpatient Visit on 09/27/2023  Component Date Value Ref Range Status   MRSA, PCR 09/27/2023 NEGATIVE  NEGATIVE Final   Staphylococcus aureus 09/27/2023 NEGATIVE  NEGATIVE Final   Comment: (NOTE) The Xpert SA Assay (FDA approved for NASAL specimens in patients 71 years of age and older), is one component of a comprehensive surveillance program. It is not intended to diagnose infection nor to guide or monitor treatment. Performed at [facility], [address] [place], [place] [address]    WBC 09/27/2023 5.9  4.0 - 10.5 K/uL Final   RBC 09/27/2023 4.24  3.87 - 5.11 MIL/uL Final   Hemoglobin 09/27/2023 12.1  12.0 - 15.0 g/dL Final   HCT 57/84/6962 36.9  36.0 - 46.0 % Final   MCV 09/27/2023 87.0  80.0 - 100.0 fL Final   MCH 09/27/2023 28.5  26.0 - 34.0 pg Final   MCHC 09/27/2023 32.8  30.0 - 36.0 g/dL Final   RDW 95/28/4132 13.2  11.5 - 15.5 % Final   Platelets 09/27/2023 195  150 - 400 K/uL Final   nRBC 09/27/2023 0.0  0.0 - 0.2 % Final   Neutrophils Relative % 09/27/2023 44  % Final   Neutro Abs 09/27/2023 2.6  1.7 - 7.7 K/uL Final   Lymphocytes Relative 09/27/2023 41  % Final   Lymphs Abs 09/27/2023 2.4  0.7 - 4.0 K/uL Final   Monocytes Relative 09/27/2023 8  % Final   Monocytes Absolute 09/27/2023 0.5  0.1 - 1.0 K/uL Final   Eosinophils Relative 09/27/2023 6  % Final   Eosinophils Absolute 09/27/2023 0.3  0.0 - 0.5 K/uL Final   Basophils Relative 09/27/2023 1  % Final   Basophils Absolute 09/27/2023 0.1  0.0 - 0.1 K/uL Final   Immature Granulocytes 09/27/2023 0  % Final   Abs Immature Granulocytes 09/27/2023 0.01  0.00 - 0.07 K/uL Final   Performed at [facility], [address] [place], [place] [address]   Sodium 09/27/2023 142  135 - 145 mmol/L Final   Potassium 09/27/2023 4.0  3.5 - 5.1 mmol/L Final   Chloride 09/27/2023 103  98 - 111 mmol/L Final   CO2 09/27/2023 31  22 - 32 mmol/L Final   Glucose, Bld 09/27/2023  110 (H)  70 - 99 mg/dL Final   Glucose reference range applies only to samples taken after fasting for at least 8 hours.   BUN 09/27/2023 16  8 - 23 mg/dL Final   Creatinine, Ser 09/27/2023 1.12 (H)  0.44 - 1.00 mg/dL Final   Calcium 27/25/3664 9.5  8.9 - 10.3 mg/dL Final   Total  Protein 09/27/2023 7.4  6.5 - 8.1 g/dL Final   Albumin 41/32/4401 4.0  3.5 - 5.0 g/dL Final   AST 02/72/5366 19  15 - 41 U/L Final   ALT 09/27/2023 16  0 - 44 U/L Final   Alkaline Phosphatase 09/27/2023 52  38 - 126 U/L Final   Total Bilirubin 09/27/2023 0.6  0.0 - 1.2 mg/dL Final   GFR, Estimated 09/27/2023 54 (L)  >60 mL/min Final   Comment: (NOTE) Calculated using the CKD-EPI Creatinine Equation (2021)    Anion gap 09/27/2023 8  5 - 15 Final   Performed at [facility], [address] [place], [place] [address]   Color, Urine 09/27/2023 YELLOW (A)  YELLOW Final   APPearance 09/27/2023 CLEAR (A)  CLEAR Final   Specific Gravity, Urine 09/27/2023 1.028  1.005 - 1.030 Final   pH 09/27/2023 5.0  5.0 - 8.0 Final   Glucose, UA 09/27/2023 NEGATIVE  NEGATIVE mg/dL Final   Hgb urine dipstick 09/27/2023 NEGATIVE  NEGATIVE Final   Bilirubin Urine 09/27/2023 NEGATIVE  NEGATIVE Final   Ketones, ur 09/27/2023 NEGATIVE  NEGATIVE mg/dL Final   Protein, ur 74/25/9563 NEGATIVE  NEGATIVE mg/dL Final   Nitrite 87/56/4332 NEGATIVE  NEGATIVE Final   Leukocytes,Ua 09/27/2023 NEGATIVE  NEGATIVE Final   Performed at [facility], [address] [place], [place] [address]    ECG: Date: 09/27/2023 Time ECG obtained: 1202 PM Rate: 60 bpm Rhythm:  Sinus rhythm with first-degree AV block Axis (leads I and aVF): normal Intervals: PR 232 ms. QRS 92 ms. QTc 452 ms. ST segment and T wave changes: No evidence of acute T wave abnormalities or significant ST segment elevation or depression.  Evidence of a possible, age undetermined, prior infarct:  No Comparison: Similar to previous tracing obtained on  11/07/2022   IMAGING / PROCEDURES: DIAGNOSTIC RADIOGRAPHS OF RIGHT KNEE 3 VIEWS performed on 08/18/2023 Moderate to severe osteoarthritic changes involving the right knee primarily involving the medial compartment of the knee.  There is near complete loss of joint space, underlying subchondral sclerosis and osteophyte summation of the lateral tibial plateau and femoral condyles well.  The patient does have osteophyte formation posterior to the patella as well.  Worsening osteophyte formation off of the lateral tibial plateau at today's visit when compared to previous x-rays.   MRI KNEE RIGHT WITHOUT CONTRAST performed on 03/09/2023 Severe meniscal degeneration and tearing of the posterior horn of the medial meniscus. Abnormality extends to the posterior root with associated meniscal extrusion.  Tricompartmental cartilage loss with reactive edema in the medial compartment.  Moderate joint effusion with synovitis.   TRANSTHORACIC ECHOCARDIOGRAM performed on 04/15/2022 Left ventricular ejection fraction, by estimation, is 55 to 60%. The left ventricle has normal function. The left ventricle has no regional wall motion abnormalities. Left ventricular diastolic parameters are consistent with Grade I diastolic dysfunction (impaired relaxation).  Right ventricular systolic function is normal. The right ventricular size is normal. Tricuspid regurgitation signal is inadequate for assessing PA pressure.  Left atrial size was mildly dilated. The mitral valve is grossly normal. Trivial mitral valve regurgitation.  The aortic valve was not well visualized. Aortic valve regurgitation is not visualized. Aortic valve sclerosis/calcification is present, without any evidence of aortic stenosis.  The inferior vena cava is normal in size with greater than 50% respiratory variability, suggesting right atrial pressure of 3 mmHg.   LONG TERM CARDIAC EVENT MONITOR STUDY performed on 03/09/2022 Patch Wear Time:  14  days and 0  hours  Predominant rhythm was sinus rhythm 2% atrial fibrillation burden 1.8% supraventricular ectopy Less than 1% ventricular ectopy Triggered episodes associated with both sinus rhythm and sinus rhythm with PACs   MR BRAIN WO CONTRAST performed on 04/16/2022 Acute nonhemorrhagic infarct of the right thalamus is better defined on today's study. This likely represents expected evolution of the infarct rather than any significant progression Remote lacunar infarct of the left thalamus is stable. Scattered white matter disease is otherwise stable. This likely reflects the sequela of chronic microvascular ischemia.   Impression and Plan:  Brandy Love has been referred for pre-anesthesia review and clearance prior to her undergoing the planned anesthetic and procedural courses. Available labs, pertinent testing, and imaging results were personally reviewed by me in preparation for upcoming operative/procedural course. [facility] medical record has been updated following extensive record review and patient interview with PAT staff.   This patient has been appropriately cleared by cardiology with an overall ACCEPTABLE risk of experiencing significant perioperative cardiovascular complications. Based on clinical review performed today (10/02/23), barring any significant acute changes in the patient's overall condition, it is anticipated that she will be able to proceed with the planned surgical intervention. Any acute changes in clinical condition may necessitate her procedure being postponed and/or cancelled. Patient will meet with anesthesia team (MD and/or CRNA) on the day of her procedure for preoperative evaluation/assessment. Questions regarding anesthetic course will be fielded at that time.   Pre-surgical instructions were reviewed with the patient during his PAT appointment, and questions were fielded to satisfaction by PAT clinical staff. She has been instructed on which medications  that she will need to hold prior to surgery, as well as the ones that have been deemed safe/appropriate to take on the day of his procedure. As part of the general education provided by PAT, patient made aware both verbally and in writing, that she would need to abstain from the use of any illegal substances during his perioperative course. She was advised that failure to follow the provided instructions could necessitate case cancellation or result in serious perioperative complications up to and including death. Patient encouraged to contact PAT and/or her surgeon's office to discuss any questions or concerns that may arise prior to surgery; verbalized understanding.   Quentin Mulling, MSN, APRN, FNP-C, CEN [facility]  Perioperative Services Nurse Practitioner Phone: [phone] Fax: [phone] 10/02/23 2:29 PM  NOTE: This note has been prepared using Dragon dictation software. Despite my best ability to proofread, there is always the potential that unintentional transcriptional errors may still occur from this process.

## 2023-10-02 ENCOUNTER — Encounter: Payer: Self-pay | Admitting: Surgery

## 2023-10-04 MED ORDER — ORAL CARE MOUTH RINSE: 15.0000 mL | Freq: Once | OROMUCOSAL | Status: AC

## 2023-10-04 MED ORDER — LACTATED RINGERS IV SOLN
INTRAVENOUS | Status: DC
Start: 2023-10-04 — End: 2023-10-05

## 2023-10-04 MED ORDER — TRANEXAMIC ACID-NACL 1000-0.7 MG/100ML-% IV SOLN
1000.0000 mg | INTRAVENOUS | Status: AC
  Administered 2023-10-05: 1000 mg via INTRAVENOUS

## 2023-10-04 MED ORDER — CEFAZOLIN SODIUM-DEXTROSE 2-4 GM/100ML-% IV SOLN
2.0000 g | INTRAVENOUS | Status: AC
  Administered 2023-10-05: 2 g via INTRAVENOUS

## 2023-10-04 MED ORDER — CHLORHEXIDINE GLUCONATE 0.12 % MT SOLN
15.0000 mL | Freq: Once | OROMUCOSAL | Status: AC
  Administered 2023-10-05: 15 mL via OROMUCOSAL

## 2023-10-05 ENCOUNTER — Encounter: Payer: Self-pay | Admitting: Surgery

## 2023-10-05 ENCOUNTER — Encounter
Admission: RE | Disposition: A | Discharge status: Home / Self Care | Payer: Self-pay | Source: Home / Self Care | Attending: Surgery

## 2023-10-05 ENCOUNTER — Ambulatory Visit

## 2023-10-05 ENCOUNTER — Ambulatory Visit
Admission: RE | Admit: 2023-10-05 | Discharge: 2023-10-06 | Disposition: A | Discharge status: Home / Self Care | Attending: Surgery | Admitting: Surgery

## 2023-10-05 ENCOUNTER — Ambulatory Visit: Payer: Self-pay | Admitting: Urgent Care

## 2023-10-05 ENCOUNTER — Other Ambulatory Visit: Payer: Self-pay

## 2023-10-05 DIAGNOSIS — K219 Gastro-esophageal reflux disease without esophagitis: Secondary | ICD-10-CM | POA: Insufficient documentation

## 2023-10-05 DIAGNOSIS — Z79899 Other long term (current) drug therapy: Secondary | ICD-10-CM | POA: Insufficient documentation

## 2023-10-05 DIAGNOSIS — M1711 Unilateral primary osteoarthritis, right knee: Secondary | ICD-10-CM | POA: Insufficient documentation

## 2023-10-05 DIAGNOSIS — Z7951 Long term (current) use of inhaled steroids: Secondary | ICD-10-CM | POA: Insufficient documentation

## 2023-10-05 DIAGNOSIS — F32A Depression, unspecified: Secondary | ICD-10-CM | POA: Insufficient documentation

## 2023-10-05 DIAGNOSIS — F419 Anxiety disorder, unspecified: Secondary | ICD-10-CM | POA: Insufficient documentation

## 2023-10-05 DIAGNOSIS — S83231A Complex tear of medial meniscus, current injury, right knee, initial encounter: Secondary | ICD-10-CM

## 2023-10-05 DIAGNOSIS — X58XXXA Exposure to other specified factors, initial encounter: Secondary | ICD-10-CM | POA: Insufficient documentation

## 2023-10-05 DIAGNOSIS — Z7901 Long term (current) use of anticoagulants: Secondary | ICD-10-CM | POA: Insufficient documentation

## 2023-10-05 DIAGNOSIS — J45909 Unspecified asthma, uncomplicated: Secondary | ICD-10-CM | POA: Diagnosis not present

## 2023-10-05 DIAGNOSIS — I10 Essential (primary) hypertension: Secondary | ICD-10-CM | POA: Insufficient documentation

## 2023-10-05 DIAGNOSIS — K279 Peptic ulcer, site unspecified, unspecified as acute or chronic, without hemorrhage or perforation: Secondary | ICD-10-CM | POA: Diagnosis not present

## 2023-10-05 DIAGNOSIS — I480 Paroxysmal atrial fibrillation: Secondary | ICD-10-CM | POA: Diagnosis not present

## 2023-10-05 DIAGNOSIS — Z96651 Presence of right artificial knee joint: Secondary | ICD-10-CM

## 2023-10-05 DIAGNOSIS — Z6837 Body mass index (BMI) 37.0-37.9, adult: Secondary | ICD-10-CM | POA: Diagnosis not present

## 2023-10-05 HISTORY — DX: Other long term (current) drug therapy: Z79.899

## 2023-10-05 HISTORY — DX: Localized edema: R60.0

## 2023-10-05 HISTORY — DX: Paroxysmal atrial fibrillation: I48.0

## 2023-10-05 HISTORY — DX: Diaphragmatic hernia without obstruction or gangrene: K44.9

## 2023-10-05 HISTORY — DX: Supraventricular tachycardia, unspecified: I47.10

## 2023-10-05 HISTORY — PX: TOTAL KNEE ARTHROPLASTY: SHX125

## 2023-10-05 HISTORY — DX: Long term (current) use of anticoagulants: Z79.01

## 2023-10-05 HISTORY — DX: Transient cerebral ischemic attack, unspecified: G45.9

## 2023-10-05 HISTORY — DX: Atherosclerosis of aorta: I70.0

## 2023-10-05 HISTORY — DX: Ophthalmoplegic migraine, not intractable: G43.B0

## 2023-10-05 HISTORY — DX: Fatty (change of) liver, not elsewhere classified: K76.0

## 2023-10-05 HISTORY — DX: Other cerebrovascular disease: I67.89

## 2023-10-05 HISTORY — DX: Complex tear of medial meniscus, current injury, right knee, initial encounter: S83.231A

## 2023-10-05 HISTORY — DX: Other ill-defined heart diseases: I51.89

## 2023-10-05 HISTORY — DX: Essential (primary) hypertension: I10

## 2023-10-05 SURGERY — ARTHROPLASTY, KNEE, TOTAL
Anesthesia: Choice | Site: Knee | Laterality: Right

## 2023-10-05 MED ORDER — GLYCOPYRROLATE 0.2 MG/ML IJ SOLN
INTRAMUSCULAR | Status: DC | PRN
  Administered 2023-10-05: .2 mg via INTRAVENOUS

## 2023-10-05 MED ORDER — ACETAMINOPHEN 325 MG PO TABS
ORAL_TABLET | ORAL | Status: AC
  Filled 2023-10-05: qty 2

## 2023-10-05 MED ORDER — PANTOPRAZOLE SODIUM 40 MG PO TBEC: 40.0000 mg | DELAYED_RELEASE_TABLET | Freq: Every day | ORAL | Status: DC

## 2023-10-05 MED ORDER — METOPROLOL SUCCINATE ER 25 MG PO TB24
12.5000 mg | ORAL_TABLET | Freq: Every day | ORAL | Status: DC
  Administered 2023-10-05: 12.5 mg via ORAL
  Filled 2023-10-05: qty 1

## 2023-10-05 MED ORDER — METOCLOPRAMIDE HCL 5 MG/ML IJ SOLN: 5.0000 mg | Freq: Three times a day (TID) | INTRAMUSCULAR | Status: DC | PRN

## 2023-10-05 MED ORDER — OXYCODONE HCL 5 MG/5ML PO SOLN: 5.0000 mg | Freq: Once | ORAL | Status: AC | PRN

## 2023-10-05 MED ORDER — FLEET ENEMA RE ENEM: 1.0000 | ENEMA | Freq: Once | RECTAL | Status: DC | PRN

## 2023-10-05 MED ORDER — DOCUSATE SODIUM 100 MG PO CAPS
100.0000 mg | ORAL_CAPSULE | Freq: Two times a day (BID) | ORAL | Status: DC
  Administered 2023-10-05 – 2023-10-06 (×2): 100 mg via ORAL
  Filled 2023-10-05 (×2): qty 1

## 2023-10-05 MED ORDER — DRONEDARONE HCL 400 MG PO TABS
400.0000 mg | ORAL_TABLET | Freq: Two times a day (BID) | ORAL | Status: DC
  Administered 2023-10-05 – 2023-10-06 (×2): 400 mg via ORAL
  Filled 2023-10-05 (×3): qty 1

## 2023-10-05 MED ORDER — PROPOFOL 1000 MG/100ML IV EMUL
INTRAVENOUS | Status: AC
  Filled 2023-10-05: qty 100

## 2023-10-05 MED ORDER — IRBESARTAN 150 MG PO TABS: 300.0000 mg | ORAL_TABLET | Freq: Every day | ORAL | Status: DC

## 2023-10-05 MED ORDER — SODIUM CHLORIDE 0.9 % IR SOLN
Status: DC | PRN
  Administered 2023-10-05: 3000 mL

## 2023-10-05 MED ORDER — TRANEXAMIC ACID-NACL 1000-0.7 MG/100ML-% IV SOLN
INTRAVENOUS | Status: AC
  Filled 2023-10-05: qty 100

## 2023-10-05 MED ORDER — KETOROLAC TROMETHAMINE 15 MG/ML IJ SOLN
15.0000 mg | Freq: Once | INTRAMUSCULAR | Status: AC
  Administered 2023-10-05: 15 mg via INTRAVENOUS

## 2023-10-05 MED ORDER — OXYCODONE HCL 5 MG PO TABS: 5.0000 mg | ORAL_TABLET | ORAL | 0 refills | Status: DC | PRN

## 2023-10-05 MED ORDER — SUCRALFATE 1 G PO TABS: 1.0000 g | ORAL_TABLET | Freq: Every day | ORAL | Status: DC | PRN

## 2023-10-05 MED ORDER — SODIUM CHLORIDE 0.9 % IV SOLN
INTRAVENOUS | Status: DC | PRN
  Administered 2023-10-05: 60 mL

## 2023-10-05 MED ORDER — FENTANYL CITRATE (PF) 100 MCG/2ML IJ SOLN
INTRAMUSCULAR | Status: AC
  Filled 2023-10-05: qty 2

## 2023-10-05 MED ORDER — HYDROMORPHONE HCL 1 MG/ML IJ SOLN: 0.5000 mg | INTRAMUSCULAR | Status: DC | PRN

## 2023-10-05 MED ORDER — BUPIVACAINE HCL (PF) 0.5 % IJ SOLN
INTRAMUSCULAR | Status: DC | PRN
  Administered 2023-10-05: 2.5 mL

## 2023-10-05 MED ORDER — KETOROLAC TROMETHAMINE 15 MG/ML IJ SOLN
INTRAMUSCULAR | Status: AC
  Filled 2023-10-05: qty 1

## 2023-10-05 MED ORDER — CHLORHEXIDINE GLUCONATE 0.12 % MT SOLN
OROMUCOSAL | Status: AC
  Filled 2023-10-05: qty 15

## 2023-10-05 MED ORDER — ACETAMINOPHEN 325 MG PO TABS: 325.0000 mg | ORAL_TABLET | Freq: Four times a day (QID) | ORAL | Status: DC | PRN

## 2023-10-05 MED ORDER — CEFAZOLIN SODIUM-DEXTROSE 2-4 GM/100ML-% IV SOLN
2.0000 g | Freq: Four times a day (QID) | INTRAVENOUS | Status: AC
  Administered 2023-10-05 (×2): 2 g via INTRAVENOUS
  Filled 2023-10-05: qty 100

## 2023-10-05 MED ORDER — OXYCODONE HCL 5 MG PO TABS
5.0000 mg | ORAL_TABLET | Freq: Once | ORAL | Status: AC | PRN
  Administered 2023-10-05: 5 mg via ORAL

## 2023-10-05 MED ORDER — KETOROLAC TROMETHAMINE 30 MG/ML IJ SOLN
INTRAMUSCULAR | Status: DC | PRN
  Administered 2023-10-05: 30 mg via INTRAVENOUS

## 2023-10-05 MED ORDER — BISACODYL 10 MG RE SUPP: 10.0000 mg | Freq: Every day | RECTAL | Status: DC | PRN

## 2023-10-05 MED ORDER — BUPIVACAINE LIPOSOME 1.3 % IJ SUSP
INTRAMUSCULAR | Status: AC
  Filled 2023-10-05: qty 20

## 2023-10-05 MED ORDER — FENTANYL CITRATE (PF) 100 MCG/2ML IJ SOLN
INTRAMUSCULAR | Status: DC | PRN
  Administered 2023-10-05: 50 ug via INTRAVENOUS
  Administered 2023-10-05 (×2): 25 ug via INTRAVENOUS

## 2023-10-05 MED ORDER — HYOSCYAMINE SULFATE 0.125 MG SL SUBL: 0.1250 mg | SUBLINGUAL_TABLET | Freq: Two times a day (BID) | SUBLINGUAL | Status: DC | PRN

## 2023-10-05 MED ORDER — OXYCODONE HCL 5 MG PO TABS
ORAL_TABLET | ORAL | Status: AC
  Filled 2023-10-05: qty 1

## 2023-10-05 MED ORDER — DIPHENHYDRAMINE HCL 12.5 MG/5ML PO ELIX: 12.5000 mg | ORAL_SOLUTION | ORAL | Status: DC | PRN

## 2023-10-05 MED ORDER — ATORVASTATIN CALCIUM 10 MG PO TABS
40.0000 mg | ORAL_TABLET | Freq: Every day | ORAL | Status: DC
  Administered 2023-10-05: 40 mg via ORAL
  Filled 2023-10-05: qty 4

## 2023-10-05 MED ORDER — FENTANYL CITRATE (PF) 100 MCG/2ML IJ SOLN
INTRAMUSCULAR | Status: AC
Start: 2023-10-05 — End: ?
  Filled 2023-10-05: qty 2

## 2023-10-05 MED ORDER — ACETAMINOPHEN 500 MG PO TABS
1000.0000 mg | ORAL_TABLET | Freq: Once | ORAL | Status: AC
  Administered 2023-10-05: 1000 mg via ORAL

## 2023-10-05 MED ORDER — MIDAZOLAM HCL 2 MG/2ML IJ SOLN
INTRAMUSCULAR | Status: AC
Start: 2023-10-05 — End: ?
  Filled 2023-10-05: qty 2

## 2023-10-05 MED ORDER — BUPIVACAINE-EPINEPHRINE (PF) 0.5% -1:200000 IJ SOLN
INTRAMUSCULAR | Status: DC | PRN
  Administered 2023-10-05: 30 mL

## 2023-10-05 MED ORDER — FENTANYL CITRATE (PF) 100 MCG/2ML IJ SOLN
25.0000 ug | INTRAMUSCULAR | Status: DC | PRN
  Administered 2023-10-05: 25 ug via INTRAVENOUS

## 2023-10-05 MED ORDER — SODIUM CHLORIDE 0.9 % BOLUS PEDS
250.0000 mL | Freq: Once | INTRAVENOUS | Status: AC
  Administered 2023-10-05: 250 mL via INTRAVENOUS

## 2023-10-05 MED ORDER — BUPIVACAINE-EPINEPHRINE (PF) 0.5% -1:200000 IJ SOLN
INTRAMUSCULAR | Status: AC
  Filled 2023-10-05: qty 30

## 2023-10-05 MED ORDER — 0.9 % SODIUM CHLORIDE (POUR BTL) OPTIME
TOPICAL | Status: DC | PRN
  Administered 2023-10-05: 1000 mL

## 2023-10-05 MED ORDER — ONDANSETRON HCL 4 MG PO TABS: 4.0000 mg | ORAL_TABLET | Freq: Four times a day (QID) | ORAL | Status: DC | PRN

## 2023-10-05 MED ORDER — ONDANSETRON HCL 4 MG/2ML IJ SOLN: 4.0000 mg | Freq: Four times a day (QID) | INTRAMUSCULAR | Status: DC | PRN

## 2023-10-05 MED ORDER — EPHEDRINE SULFATE-NACL 50-0.9 MG/10ML-% IV SOSY
PREFILLED_SYRINGE | INTRAVENOUS | Status: DC | PRN
Start: 2023-10-05 — End: 2023-10-05
  Administered 2023-10-05 (×3): 5 mg via INTRAVENOUS

## 2023-10-05 MED ORDER — GABAPENTIN 100 MG PO CAPS: 300.0000 mg | ORAL_CAPSULE | ORAL | Status: DC

## 2023-10-05 MED ORDER — VALSARTAN-HYDROCHLOROTHIAZIDE 320-25 MG PO TABS: 1.0000 | ORAL_TABLET | Freq: Every day | ORAL | Status: DC

## 2023-10-05 MED ORDER — SODIUM CHLORIDE 0.9 % IV SOLN: INTRAVENOUS | Status: AC

## 2023-10-05 MED ORDER — TRIAMCINOLONE ACETONIDE 40 MG/ML IJ SUSP
INTRAMUSCULAR | Status: DC | PRN
  Administered 2023-10-05: 80 mg via INTRAMUSCULAR

## 2023-10-05 MED ORDER — CEFAZOLIN SODIUM-DEXTROSE 2-4 GM/100ML-% IV SOLN
INTRAVENOUS | Status: AC
  Filled 2023-10-05: qty 100

## 2023-10-05 MED ORDER — PROPOFOL 500 MG/50ML IV EMUL
INTRAVENOUS | Status: DC | PRN
  Administered 2023-10-05: 75 ug/kg/min via INTRAVENOUS

## 2023-10-05 MED ORDER — ACETAMINOPHEN 500 MG PO TABS
ORAL_TABLET | ORAL | Status: AC
  Filled 2023-10-05: qty 2

## 2023-10-05 MED ORDER — PHENYLEPHRINE 80 MCG/ML (10ML) SYRINGE FOR IV PUSH (FOR BLOOD PRESSURE SUPPORT)
PREFILLED_SYRINGE | INTRAVENOUS | Status: DC | PRN
  Administered 2023-10-05 (×3): 80 ug via INTRAVENOUS

## 2023-10-05 MED ORDER — RIVAROXABAN 20 MG PO TABS
20.0000 mg | ORAL_TABLET | Freq: Every day | ORAL | Status: DC
  Filled 2023-10-05: qty 1

## 2023-10-05 MED ORDER — MIDAZOLAM HCL 5 MG/5ML IJ SOLN
INTRAMUSCULAR | Status: DC | PRN
  Administered 2023-10-05: .5 mg via INTRAVENOUS
  Administered 2023-10-05: 1 mg via INTRAVENOUS
  Administered 2023-10-05 (×3): .5 mg via INTRAVENOUS
  Administered 2023-10-05: 1 mg via INTRAVENOUS

## 2023-10-05 MED ORDER — GABAPENTIN 100 MG PO CAPS
300.0000 mg | ORAL_CAPSULE | Freq: Two times a day (BID) | ORAL | Status: DC
  Administered 2023-10-06: 300 mg via ORAL
  Filled 2023-10-05: qty 3

## 2023-10-05 MED ORDER — SODIUM CHLORIDE (PF) 0.9 % IJ SOLN
INTRAMUSCULAR | Status: AC
  Filled 2023-10-05: qty 40

## 2023-10-05 MED ORDER — ACETAMINOPHEN 500 MG PO TABS
1000.0000 mg | ORAL_TABLET | Freq: Four times a day (QID) | ORAL | Status: DC
Start: 2023-10-05 — End: 2023-10-06
  Administered 2023-10-05 – 2023-10-06 (×3): 1000 mg via ORAL
  Filled 2023-10-05 (×3): qty 2

## 2023-10-05 MED ORDER — TRIAMCINOLONE ACETONIDE 40 MG/ML IJ SUSP
INTRAMUSCULAR | Status: AC
  Filled 2023-10-05: qty 2

## 2023-10-05 MED ORDER — METOCLOPRAMIDE HCL 10 MG PO TABS: 5.0000 mg | ORAL_TABLET | Freq: Three times a day (TID) | ORAL | Status: DC | PRN

## 2023-10-05 MED ORDER — HYDROCHLOROTHIAZIDE 25 MG PO TABS: 25.0000 mg | ORAL_TABLET | Freq: Every day | ORAL | Status: DC

## 2023-10-05 MED ORDER — ALBUTEROL SULFATE (2.5 MG/3ML) 0.083% IN NEBU: 3.0000 mL | INHALATION_SOLUTION | Freq: Four times a day (QID) | RESPIRATORY_TRACT | Status: DC | PRN

## 2023-10-05 MED ORDER — MAGNESIUM HYDROXIDE 400 MG/5ML PO SUSP: 30.0000 mL | Freq: Every day | ORAL | Status: DC | PRN

## 2023-10-05 MED ORDER — OXYCODONE HCL 5 MG PO TABS
ORAL_TABLET | ORAL | Status: AC
Start: 2023-10-05 — End: ?
  Filled 2023-10-05: qty 1

## 2023-10-05 MED ORDER — OXYCODONE HCL 5 MG PO TABS
5.0000 mg | ORAL_TABLET | ORAL | Status: DC | PRN
  Administered 2023-10-05 – 2023-10-06 (×4): 5 mg via ORAL
  Filled 2023-10-05 (×2): qty 1

## 2023-10-05 MED ORDER — GABAPENTIN 400 MG PO CAPS
900.0000 mg | ORAL_CAPSULE | Freq: Every day | ORAL | Status: DC
  Administered 2023-10-05: 900 mg via ORAL
  Filled 2023-10-05: qty 1

## 2023-10-05 MED ORDER — ONDANSETRON HCL 4 MG/2ML IJ SOLN
INTRAMUSCULAR | Status: DC | PRN
  Administered 2023-10-05: 4 mg via INTRAVENOUS

## 2023-10-05 MED ORDER — MIDAZOLAM HCL 2 MG/2ML IJ SOLN
INTRAMUSCULAR | Status: AC
  Filled 2023-10-05: qty 2

## 2023-10-05 SURGICAL SUPPLY — 61 items
BLADE SAW 90X13X1.19 OSCILLAT (BLADE) ×1 IMPLANT
BLADE SAW SAG 25X90X1.19 (BLADE) ×1 IMPLANT
BLADE SURG SZ20 CARB STEEL (BLADE) ×1 IMPLANT
BNDG ELASTIC 6X5.8 VLCR NS LF (GAUZE/BANDAGES/DRESSINGS) ×1 IMPLANT
CEMENT BONE R 1X40 (Cement) ×2 IMPLANT
CEMENT VACUUM MIXING SYSTEM (MISCELLANEOUS) ×1 IMPLANT
CHLORAPREP W/TINT 26 (MISCELLANEOUS) ×1 IMPLANT
COMP FEM CMT PERSONA SZ7 RT (Joint) ×1 IMPLANT
COMP TIB PS KNEE E 0D RT (Joint) ×1 IMPLANT
COMPONENT FEM CMT PRSONA SZ7RT (Joint) IMPLANT
COMPONET TIB PS KNEE E 0D RT (Joint) IMPLANT
COOLER POLAR GLACIER W/PUMP (MISCELLANEOUS) ×1 IMPLANT
COVER MAYO STAND STRL (DRAPES) ×1 IMPLANT
CUFF TOURN SGL QUICK 42 (TOURNIQUET CUFF) IMPLANT
CUFF TRNQT CYL 24X4X16.5-23 (TOURNIQUET CUFF) IMPLANT
CUFF TRNQT CYL 34X4.125X (TOURNIQUET CUFF) IMPLANT
DRAPE IMP U-DRAPE 54X76 (DRAPES) ×1 IMPLANT
DRAPE INCISE 23X17 STRL (DRAPES) IMPLANT
DRAPE INCISE IOBAN 23X17 STRL (DRAPES) ×1 IMPLANT
DRAPE SHEET LG 3/4 BI-LAMINATE (DRAPES) ×1 IMPLANT
DRAPE U-SHAPE 47X51 STRL (DRAPES) ×1 IMPLANT
DRSG MEPILEX SACRM 8.7X9.8 (GAUZE/BANDAGES/DRESSINGS) IMPLANT
DRSG OPSITE POSTOP 4X10 (GAUZE/BANDAGES/DRESSINGS) ×1 IMPLANT
DRSG OPSITE POSTOP 4X8 (GAUZE/BANDAGES/DRESSINGS) ×1 IMPLANT
ELECT CAUTERY BLADE 6.4 (BLADE) ×1 IMPLANT
ELECT REM PT RETURN 9FT ADLT (ELECTROSURGICAL) ×1 IMPLANT
ELECTRODE REM PT RTRN 9FT ADLT (ELECTROSURGICAL) ×1 IMPLANT
GAUZE XEROFORM 1X8 LF (GAUZE/BANDAGES/DRESSINGS) ×1 IMPLANT
GLOVE BIO SURGEON STRL SZ7.5 (GLOVE) ×4 IMPLANT
GLOVE BIO SURGEON STRL SZ8 (GLOVE) ×4 IMPLANT
GLOVE BIOGEL PI IND STRL 8 (GLOVE) ×1 IMPLANT
GLOVE INDICATOR 8.0 STRL GRN (GLOVE) ×1 IMPLANT
GOWN STRL REUS W/ TWL LRG LVL3 (GOWN DISPOSABLE) ×1 IMPLANT
GOWN STRL REUS W/ TWL XL LVL3 (GOWN DISPOSABLE) ×1 IMPLANT
HOOD PEEL AWAY T7 (MISCELLANEOUS) ×3 IMPLANT
INSERT TIB ARTISURF SZ6-7 (Insert) IMPLANT
KIT TURNOVER KIT A (KITS) ×1 IMPLANT
MANIFOLD NEPTUNE II (INSTRUMENTS) ×1 IMPLANT
NDL SPNL 20GX3.5 QUINCKE YW (NEEDLE) ×1 IMPLANT
NEEDLE SPNL 20GX3.5 QUINCKE YW (NEEDLE) ×1 IMPLANT
NS IRRIG 500ML POUR BTL (IV SOLUTION) ×1 IMPLANT
PACK TOTAL KNEE (MISCELLANEOUS) ×1 IMPLANT
PAD WRAPON POLAR KNEE (MISCELLANEOUS) ×1 IMPLANT
PENCIL SMOKE EVACUATOR (MISCELLANEOUS) ×1 IMPLANT
PIN DRILL HDLS TROCAR 75 4PK (PIN) IMPLANT
PULSAVAC PLUS IRRIG FAN TIP (DISPOSABLE) ×1 IMPLANT
SCREW FEMALE HEX FIX 25X2.5 (ORTHOPEDIC DISPOSABLE SUPPLIES) IMPLANT
SOL .9 NS 3000ML IRR UROMATIC (IV SOLUTION) ×1 IMPLANT
STAPLER SKIN PROX 35W (STAPLE) ×1 IMPLANT
STEM POLY PAT PLY 32M KNEE (Knees) IMPLANT
STOCKINETTE IMPERV 14X48 (MISCELLANEOUS) ×1 IMPLANT
SUCTION TUBE FRAZIER 10FR DISP (SUCTIONS) ×1 IMPLANT
SUT VIC AB 0 CT1 36 (SUTURE) ×3 IMPLANT
SUT VIC AB 2-0 CT1 TAPERPNT 27 (SUTURE) ×3 IMPLANT
SYR 10ML LL (SYRINGE) ×1 IMPLANT
SYR 20ML LL LF (SYRINGE) ×1 IMPLANT
SYR 30ML LL (SYRINGE) IMPLANT
TIP FAN IRRIG PULSAVAC PLUS (DISPOSABLE) ×1 IMPLANT
TRAP FLUID SMOKE EVACUATOR (MISCELLANEOUS) ×1 IMPLANT
WATER STERILE IRR 500ML POUR (IV SOLUTION) ×1 IMPLANT
WRAPON POLAR PAD KNEE (MISCELLANEOUS) ×1 IMPLANT

## 2023-10-05 NOTE — H&P (Signed)
 History of Present Illness:  Brandy Love is a 68 y.o. female who has severe Right knee pain. Patient reports a longstanding history of right knee pain that has persisted and progressively gotten worse. She states that most the pain localizes to the posterior aspect of her knee, but sometimes feels like it is all throughout her knee. She describes the pain as aching and throbbing. She denies any radiation, numbness or tingling symptoms down her leg. Her pain and discomfort has began to greatly affect her ability to perform her ADLs and ambulate long distances. She has failed conservative treatment including intra-articular corticosteroid injections, including Zilretta injections, radiofrequency nerve ablation, viscosupplementation injections, Tylenol, topical Voltaren cream, activity modification. She presents today to clinic in a wheelchair for ambulation assistance . She has requested operative intervention for relief of her DJD symptoms. Patient does report a history of cardiac issues including atrial fibrillation and currently takes Xarelto, which she appropriately paused prior to surgery. She does have a history of ischemic stroke and TIAs in the past. No DVTs noted. States that she has controlled asthma, but denies any other pulmonary issues. Patient is not a diabetic.  Social Hx: Patient lives at home with her husband, who will help look after her postsurgically. She denies any alcohol, illicit drug, nicotine use. No smoking or vaping.  Allergies: Shellfish Containing Products (Anaphylaxis, Nausea, Swelling, Swelling of throat)  Sulfa (Sulfonamide Antibiotics) Hives  Erythromycin Unknown and Hives  Ibuprofen Rash and Other (Vertigo)  Iodine Other (Nausea, Swelling, Swelling of throat)  Adhesive Tape-Silicones Unknown, Other (Rash, Rash with transderm)  Amlodipine Rash, Hives and Swelling  Povidone-Iodine Rash and Other (ALL TOPICAL IODINES)  Silicone Other (See Comments) and Rash  Spironolactone  Rash   Medicines: acetaminophen (TYLENOL) 650 MG ER tablet Take 1,300 mg by mouth 2 (two) times daily  albuterol 90 mcg/actuation inhaler Inhale into the lungs  atorvastatin (LIPITOR) 40 MG tablet Take 40 mg by mouth once daily  benzonatate (TESSALON) 200 MG capsule 3 (three) times daily as needed  clotrimazole-betamethasone (LOTRISONE) 1-0.05 % cream Apply topically  gabapentin (NEURONTIN) 300 MG capsule TAKE 1 CAPSULE BY MOUTH IN THE MORNING, 1 CAPSULE IN THE AFTERNOON, AND 3 CAPSULES AT NIGHT. 150 capsule 2  hyoscyamine (LEVSIN/SL) 0.125 mg SL tablet Take 0.125 mg by mouth every 6 (six) hours as needed  meclizine (ANTIVERT) 12.5 mg tablet 1 tablet as needed Orally every 12 hrs for vertigo or nausea for 30 days  MULTAQ 400 mg tablet Take 400 mg by mouth 2 (two) times daily with meals  omeprazole (PRILOSEC) 40 MG DR capsule Take 40 mg by mouth once daily  rivaroxaban (XARELTO) 20 mg tablet Take by mouth daily with breakfast  sertraline (ZOLOFT) 100 MG tablet Take 100 mg by mouth 2 (two) times daily  sucralfate (CARAFATE) 1 gram tablet Take 1 g by mouth 2 (two) times daily before meals  triamcinolone acetonide (ORALONE) 0.1 % paste Apply topically 2 (two) times daily  valsartan-hydroCHLOROthiazide (DIOVAN-HCT) 320-25 mg tablet Take 1 tablet by mouth once daily  dronedarone (MULTAQ) 400 mg tablet Take 400 mg by mouth 2 (two) times daily with meals  metoprolol succinate (TOPROL-XL) 25 MG XL tablet Take 12.5 mg by mouth once daily   Past Medical History:  Asthma, unspecified asthma severity, unspecified whether complicated, unspecified whether persistent (HHS-HCC)  Stroke (cerebrum) (CMS/HHS-HCC)   Past Surgical History:  APPENDECTOMY   Physical Exam:  Ht:170.2 cm (5\' 7" ) Wt:(!) 105.3 kg (232 lb 3.2 oz) BMI: Body mass  index is 36.37 kg/m.  General/Constitutional: No apparent distress: well-nourished and well developed. Eyes: Pupils equal, round with synchronous movement. Lymphatic: No  palpable adenopathy. Respiratory: Patient has good chest rise and fall with inspiration and expiration. All lung fields are clear to auscultation bilaterally. There is no Rales, rhonchi or wheezes appreciated. Cardiovascular: Upon auscultation there is a regular rate and rhythm without any murmurs, rubs, gallops or heaves appreciated. There does not appear to be any swelling down the lower extremities. Posterior tibial pulses appreciated bilaterally, 2+. Integumentary: No impressive skin lesions present, except as noted in detailed exam. Neuro/Psych: Normal mood and affect, oriented to person, place and time. Musculoskeletal: see exam below  Right knee exam: Upon inspection of the patient's right knee there does not appear to be any skin changes, open abrasions, swelling or redness. There is a slight varus alignment. Upon palpation, the patient reports having pain along the medial and lateral aspect of their knee. Baker's cyst appreciated, which is also tender to palpation. Patient has full extension actively with ROM, and able to flex back to 108 degrees flexion with mild pain. Varus and valgus stress testing shows a stable knee. The patella tracks well within the femoral groove from flexion into extension mild crepitus appreciated. Patient does have a positive patellar compression test. Anterior and posterior drawer testing negative. Patient is neurovascularly intact down their lower extremity to all dermatomes. Posterior tibial pulses appreciated 2+.  Right Knee Imaging: Previous images of the patient's right knee from 08/18/2023 were reviewed. These x-rays show underlying moderate to severe osteoarthritic changes present mostly involving the medial compartment. Subchondral changes and osteophyte formation noted. No fractures, lytic lesions or gross forms appreciated on films.  MRI KNEE RIGHT WITHOUT CONTRAST: 1. Severe meniscal degeneration and tearing of the posterior horn of the medial meniscus.  Abnormality extends to the posterior root with associated meniscal extrusion. 2. Tricompartmental cartilage loss with reactive edema in the medial compartment. 3. Moderate joint effusion with synovitis.   Impression: 1. Primary osteoarthritis of right knee. 2. Severe obesity (BMI 35.0-39.9) with comorbidity.  Plan:  1. Treatment options were discussed today with the patient. 2. The patient has been experiencing ongoing right knee pain in the setting of underlying right knee osteoarthritic changes. No significant relief with recent conservative treatment of her right knee pain. 3. After a discussion of the pros and cons of conservative and aggressive treatment options, the patient would like to proceed with a right total knee arthroplasty to be performed by Dr. Joice Lofts in the future. 4. Dr. Joice Lofts has evaluated the patient today's visit as well and agrees with the plan for surgery. 5. This document will serve as a surgical history and physical for the patient. She will follow-up per standard postop protocol. 6. She can contact the clinic if she has any questions, new symptoms develop or symptoms worsen.  The procedure was discussed with the patient, as were the potential risks (including bleeding, infection, nerve and/or blood vessel injury, persistent or recurrent pain, failure of the hardware, stiffness, instability, need for further surgery, blood clots, strokes, heart attacks and/or arhythmias, pneumonia, etc.) and benefits. The patient states her understanding and wishes to proceed.     H&P reviewed and patient re-examined. No changes.

## 2023-10-05 NOTE — Transfer of Care (Signed)
 Immediate Anesthesia Transfer of Care Note  Patient: Brandy Love  Procedure(s) Performed: ARTHROPLASTY, KNEE, TOTAL (Right: Knee)  Patient Location: PACU  Anesthesia Type:Spinal  Level of Consciousness: awake, alert , and oriented  Airway & Oxygen Therapy: Patient Spontanous Breathing and Patient connected to face mask oxygen  Post-op Assessment: Report given to RN, Post -op Vital signs reviewed and stable, and Patient moving all extremities  Post vital signs: Reviewed and stable  Last Vitals:  Vitals Value Taken Time  BP 124/63 10/05/23 1001  Temp 37.1 C 10/05/23 1001  Pulse 74 10/05/23 1008  Resp 20 10/05/23 1008  SpO2 100 % 10/05/23 1008  Vitals shown include unfiled device data.  Last Pain:  Vitals:   10/05/23 0621  PainSc: 2          Complications: No notable events documented.

## 2023-10-05 NOTE — Evaluation (Signed)
 Physical Therapy Evaluation Patient Details Name: Brandy Love MRN: [id number] DOB: 02/12/1956 Today's Date: 10/05/2023  History of Present Illness  Pt is a 68 yo female s/p R TKA. PMHx atrial fibrillation, glaucoma, asthma, GERD, hypertension, bladder prolapse, CVA.  Clinical Impression  Pt alert, agreeable to PT. Reported at baseline she is independent, lives with her husband (who performs most IADLs). She was able to perform supine to sit, CGA, use of bed rails. A few seated therex with verbal cues. Sit <> stand with RW required min, almost modA to successfully come up into standing despite multiple attempts, repositioning EOB, and proper hand placement. She was able to ambulate ~59ft with RW and CGA, exhibiting increased fatigue, decreased endurance, antalgic gait and then complained of dizziness. BP assessed in sitting (115/55) and in standing ~ 1 minute (101/42). Pt agreeable to attempt ambulation again, but only made it ~26ft prior to fatiguing and increased dizziness. Care team updated of pt status.  Overall the patient demonstrated deficits (see "PT Problem List") that impede the patient's functional abilities, safety, and mobility and would benefit from skilled PT intervention.          If plan is discharge home, recommend the following: A little help with walking and/or transfers;A little help with bathing/dressing/bathroom;Assistance with cooking/housework;Assist for transportation;Help with stairs or ramp for entrance   Can travel by private vehicle        Equipment Recommendations None recommended by PT  Recommendations for Other Services       Functional Status Assessment Patient has had a recent decline in their functional status and demonstrates the ability to make significant improvements in function in a reasonable and predictable amount of time.     Precautions / Restrictions Precautions Precautions: Fall;Knee Precaution Booklet Issued: Yes (comment) Recall of  Precautions/Restrictions: Intact Restrictions Weight Bearing Restrictions Per Provider Order: Yes RLE Weight Bearing Per Provider Order: Weight bearing as tolerated      Mobility  Bed Mobility Overal bed mobility: Needs Assistance Bed Mobility: Supine to Sit     Supine to sit: Contact guard, Used rails, HOB elevated          Transfers Overall transfer level: Needs assistance Equipment used: Rolling walker (2 wheels) Transfers: Sit to/from Stand Sit to Stand: Min assist           General transfer comment: minA from EOB nearly modA, minA from recliner for steadying    Ambulation/Gait Ambulation/Gait assistance: Contact guard assist Gait Distance (Feet):  (82ft, 80ft) Assistive device: Rolling walker (2 wheels)         General Gait Details: very effortful. very antalgic. pt reported dizziness with each bout. BP checked in standing after seated rest break, 101/40.  Stairs            Wheelchair Mobility     Tilt Bed    Modified Rankin (Stroke Patients Only)       Balance Overall balance assessment: Needs assistance Sitting-balance support: Feet supported Sitting balance-Leahy Scale: Fair     Standing balance support: Reliant on assistive device for balance Standing balance-Leahy Scale: Poor                               Pertinent Vitals/Pain Pain Assessment Pain Assessment: 0-10 Pain Score: 4  Pain Location: R knee Pain Descriptors / Indicators: Aching, Sore, Grimacing, Guarding Pain Intervention(s): Limited activity within patient's tolerance, Monitored during session, Premedicated before session, Repositioned, Ice applied  Home Living Family/patient expects to be discharged to:: Private residence Living Arrangements: Spouse/significant other Available Help at Discharge: Family Type of Home: House Home Access: Stairs to enter Entrance Stairs-Rails: Lawyer of Steps: 3   Home Layout: One  level Home Equipment: Shower seat - built Charity fundraiser (2 wheels);Rollator (4 wheels);Cane - quad      Prior Function Prior Level of Function : Independent/Modified Independent                     Extremity/Trunk Assessment   Upper Extremity Assessment Upper Extremity Assessment: Overall WFL for tasks assessed (reported chronic LUE numbness)    Lower Extremity Assessment Lower Extremity Assessment:  (able to lift BLE against gravity)       Communication        Cognition Arousal: Alert Behavior During Therapy: WFL for tasks assessed/performed   PT - Cognitive impairments: No apparent impairments                         Following commands: Intact       Cueing       General Comments      Exercises Total Joint Exercises Ankle Circles/Pumps: AROM, 10 reps, Both Heel Slides: AROM, Right, 10 reps, Strengthening Long Arc Quad: AROM, Right, 10 reps, Strengthening   Assessment/Plan    PT Assessment Patient needs continued PT services  PT Problem List Decreased strength;Decreased activity tolerance;Decreased balance;Decreased mobility;Decreased knowledge of precautions;Decreased knowledge of use of DME;Decreased range of motion;Pain       PT Treatment Interventions DME instruction;Balance training;Gait training;Neuromuscular re-education;Stair training;Functional mobility training;Patient/family education;Therapeutic activities;Therapeutic exercise    PT Goals (Current goals can be found in the Care Plan section)  Acute Rehab PT Goals Patient Stated Goal: to go home PT Goal Formulation: With patient Time For Goal Achievement: 10/19/23 Potential to Achieve Goals: Good    Frequency BID     Co-evaluation               AM-PAC PT "6 Clicks" Mobility  Outcome Measure Help needed turning from your back to your side while in a flat bed without using bedrails?: A Little Help needed moving from lying on your back to sitting on the side of a  flat bed without using bedrails?: A Little Help needed moving to and from a bed to a chair (including a wheelchair)?: A Little Help needed standing up from a chair using your arms (e.g., wheelchair or bedside chair)?: A Little Help needed to walk in hospital room?: A Little Help needed climbing 3-5 steps with a railing? : A Lot 6 Click Score: 17    End of Session Equipment Utilized During Treatment: Gait belt Activity Tolerance: Patient limited by fatigue;Other (comment) (limited by dizziness)   Nurse Communication: Mobility status PT Visit Diagnosis: Other abnormalities of gait and mobility (R26.89);Difficulty in walking, not elsewhere classified (R26.2);Muscle weakness (generalized) (M62.81);Pain Pain - Right/Left: Right Pain - part of body: Knee    Time: 1350-1429 PT Time Calculation (min) (ACUTE ONLY): 39 min   Charges:   PT Evaluation $PT Eval Low Complexity: 1 Low PT Treatments $Therapeutic Activity: 23-37 mins PT General Charges $$ ACUTE PT VISIT: 1 Visit       Olga Coaster PT, DPT 3:42 PM,10/05/23

## 2023-10-05 NOTE — Discharge Instructions (Addendum)
 Orthopedic discharge instructions: May shower with intact OpSite dressing. Apply ice frequently to knee or use Polar Care. Resume daily dose of Xarelto tomorrow morning. Take pain medication as prescribed when needed.  May supplement with ES Tylenol if necessary. May weight-bear as tolerated on right leg - use walker for balance and support. Follow-up in 10-14 days or as scheduled.   [facility].  [address] , [place], [place], [address]. [phone] They will call you to arrange when they can come to see you    POLAR CARE INFORMATION  MassAdvertisement.it  How to use [facility] Polar Care [facility] Therapy System?  YouTube   [email or web address].uk  OPERATING INSTRUCTIONS  Start the product With dry hands, connect the transformer to the electrical connection located on the top of the cooler. Next, plug the transformer into an appropriate electrical outlet. The unit will automatically start running at this point.  To stop the pump, disconnect electrical power.  Unplug to stop the product when not in use. Unplugging the Polar Care unit turns it off. Always unplug immediately after use. Never leave it plugged in while unattended. Remove pad.    FIRST ADD WATER TO FILL LINE, THEN ICE---Replace ice when existing ice is almost melted  1 Discuss Treatment with your Licensed Health Care Practitioner and Use Only as Prescribed 2 Apply Insulation Barrier & Cold Therapy Pad 3 Check for Moisture 4 Inspect Skin Regularly  Tips and Trouble Shooting Usage Tips 1. Use cubed or chunked ice for optimal performance. 2. It is recommended to drain the Pad between uses. To drain the pad, hold the Pad upright with the hose pointed toward the ground. Depress the black plunger and allow water to drain out. 3. You may disconnect the Pad from the unit without removing the pad from the affected area by depressing the silver tabs on the hose coupling and gently pulling the hoses apart.  The Pad and unit will seal itself and will not leak. Note: Some dripping during release is normal. 4. DO NOT RUN PUMP WITHOUT WATER! The pump in this unit is designed to run with water. Running the unit without water will cause permanent damage to the pump. 5. Unplug unit before removing lid.  TROUBLESHOOTING GUIDE Pump not running, Water not flowing to the pad, Pad is not getting cold 1. Make sure the transformer is plugged into the wall outlet. 2. Confirm that the ice and water are filled to the indicated levels. 3. Make sure there are no kinks in the pad. 4. Gently pull on the blue tube to make sure the tube/pad junction is straight. 5. Remove the pad from the treatment site and ll it while the pad is lying at; then reapply. 6. Confirm that the pad couplings are securely attached to the unit. Listen for the double clicks (Figure 1) to confirm the pad couplings are securely attached.  Leaks    Note: Some condensation on the lines, controller, and pads is unavoidable, especially in warmer climates. 1. If using a [employer] Polar Care Cold Therapy unit with a detachable Cold Therapy Pad, and a leak exists (other than condensation on the lines) disconnect the pad couplings. Make sure the silver tabs on the couplings are depressed before reconnecting the pad to the pump hose; then confirm both sides of the coupling are properly clicked in. 2. If the coupling continues to leak or a leak is detected in the pad itself, stop using it and call [employer] Customer Care at [phone].  Cleaning  After use, empty and dry the unit with a soft cloth. Warm water and mild detergent may be used occasionally to clean the pump and tubes.  WARNING: The Polar Care Cube can be cold enough to cause serious injury, including full skin necrosis. Follow these Operating Instructions, and carefully read the Product Insert (see pouch on side of unit) and the Cold Therapy Pad Fitting Instructions (provided with each Cold Therapy  Pad) prior to use.

## 2023-10-05 NOTE — Op Note (Signed)
 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  Post-Op Diagnosis:   Same  Procedure:   Right TKA using all-cemented Zimmer Persona system with a #7 PCR femur, a(n) E-sized  tibial tray with a 12 mm medial congruent E-poly insert, and a 32 x 8.5 mm all-poly 3-pegged domed patella.  Surgeon:   Maryagnes Amos, MD  Assistant:   Horris Latino, PA-C   Anesthesia:   Spinal  Findings:   As above  Complications:   None  EBL:   10 cc  Fluids:   500 cc crystalloid  UOP:   None  TT:   90 minutes at 300 mmHg  Drains:   None  Closure:   Staples  Implants:   As above  Brief Clinical Note:   The patient is a 68 year old female with a long history of progressively worsening right knee pain. The patient's symptoms have progressed despite medications, activity modification, injections, etc. The patient's history and examination were consistent with advanced degenerative joint disease of the right knee confirmed by plain radiographs. The patient presents at this time for a right total knee arthroplasty.  Procedure:   The patient was brought into the operating room. After adequate spinal anesthesia was obtained, the patient was repositioned in the supine position on the operating room table. The right lower extremity was prepped with ChloraPrep solution and draped sterilely. Preoperative antibiotics were administered. A timeout was performed to verify the appropriate surgical site before the limb was exsanguinated with an Esmarch and the tourniquet inflated to 300 mmHg.   A standard anterior approach to the knee was made through an approximately 6-7 inch incision. The incision was carried down through the subcutaneous tissues to expose superficial retinaculum. This was split the length of the incision and the medial flap elevated sufficiently to expose the medial retinaculum. The medial retinaculum was  incised, leaving a 3-4 mm cuff of tissue on the patella. This was extended distally along the medial border of the patellar tendon and proximally through the medial third of the quadriceps tendon. A subtotal fat pad excision was performed before the soft tissues were elevated off the anteromedial and anterolateral aspects of the proximal tibia to the level of the collateral ligaments. The anterior portions of the medial and lateral menisci were removed, as was the anterior cruciate ligament. With the knee flexed to 90, the external tibial guide was positioned and the appropriate proximal tibial cut made. This piece was taken to the back table where it was measured and found to be optimally replicated by a(n) E-sized component.  Attention was directed to the distal femur. The intramedullary canal was accessed through a 3/8" drill hole. The intramedullary guide was inserted and placed at 5 of valgus alignment. Using the +0 slot, the distal cut was made. The distal femur was measured and found to be optimally replicated by the #7 component. The #7 4-in-1 cutting block was positioned and first the posterior, then the posterior chamfer, the anterior, and finally the anterior chamfer cuts were made after verifying that the anterior cortex would not be notched.   At this point, the posterior portions medial and lateral menisci were removed. A trial reduction was performed using the appropriate femoral and tibial components with first the 10 mm, then the 11 mm, and finally the 12 mm insert. The 12 mm insert demonstrated excellent stability to varus and valgus stressing  both in flexion and extension while permitting full extension. Patellar tracking was assessed and found to be excellent. Therefore, the tibial trial position was marked on the proximal tibia. The patella thickness was measured and found to be 21 mm. Therefore, the appropriate cut was made. The patellar surface was measured and found to be optimally  replicated by the 32 mm component. The three peg holes were drilled in place before the trial button was inserted. Patella tracking was assessed and found to be excellent, passing the "no thumb test". The lug holes were drilled into the distal femur before the trial component was removed.  The tibial tray was repositioned before the keel was created using the appropriate tower, reamer, and punch.  The bony surfaces were prepared for cementing by irrigating them thoroughly with sterile saline solution via the jet lavage system. A bone plug was fashioned from some of the bone that had been removed previously and used to plug the distal femoral canal. In addition, a "cocktail" of 20 cc of Exparel, 30 cc of 0.5% Sensorcaine, 2 cc of Kenalog 40 (80 mg), and 30 mg of Toradol diluted out to 90 cc with normal saline was injected into the postero-medial and postero-lateral aspects of the knee, the medial and lateral gutter regions, and the peri-incisional tissues to help with postoperative analgesia. Meanwhile, the cement was being mixed on the back table.   When the cement was ready, the tibial tray was cemented in first. The excess cement was removed using Personal assistant. Next, the femoral component was impacted into place. Again, the excess cement was removed using Personal assistant. The 12 mm trial insert was positioned and the knee brought into extension while the cement hardened. Finally, the patella was cemented into place and secured using the patellar clamp. Again, the excess cement was removed using Personal assistant. Once the cement had hardened, the knee was placed through a range of motion with the findings as described above. Therefore, the trial insert was removed and, after verifying that no cement had been retained posteriorly, the permanent 12 mm medial congruent E-polyethylene insert was snapped into place with care taken to ensure appropriate locking of the insert. Again the knee was placed through a  range of motion with the findings as described above.  The wound was copiously irrigated with sterile saline solution using the jet lavage system before the quadriceps tendon and retinacular layer were reapproximated using #0 Vicryl interrupted sutures. The superficial retinacular layer also was closed using a running #0 Vicryl suture. The subcutaneous tissues were closed in several layers using 2-0 Vicryl interrupted sutures. The skin was closed using staples. A sterile honeycomb dressing was applied to the skin before the leg was wrapped with an Ace wrap to accommodate the Polar Care device. The patient was then awakened and returned to the recovery room in satisfactory condition after tolerating the procedure well.

## 2023-10-05 NOTE — Discharge Summary (Signed)
 Physician Discharge Summary  Patient ID: Brandy Love MRN: [id number] DOB/AGE: 16-May-1956 68 y.o.  Admit date: 10/05/2023 Discharge date: 10/06/2023  Admission Diagnoses:  Primary osteoarthritis of right knee [M17.11] Status post total knee replacement using cement, right [Z96.651]   Discharge Diagnoses: Patient Active Problem List   Diagnosis Date Noted   Status post total knee replacement using cement, right 10/05/2023   Rectal bleeding 08/16/2023   Adenomatous polyp of colon 08/16/2023   Stroke (HCC) 04/15/2022   Acute CVA (cerebrovascular accident) (HCC) 04/14/2022   Paroxysmal atrial fibrillation (HCC) 04/04/2022   Cystocele with prolapse 06/04/2019   Other fatigue 10/17/2017   Shortness of breath on exertion 10/17/2017   Essential hypertension 10/17/2017   Prediabetes 10/17/2017   CHRONIC RHINITIS 11/17/2008   COUGH 08/13/2008   Unspecified glaucoma 07/18/2008   Asthma 07/18/2008   GERD 07/18/2008    Past Medical History:  Diagnosis Date   Anemia    Anxiety    Aortic atherosclerosis (HCC)    Asthma    Bladder prolapse, female, acquired    Cerebral microvascular disease    Depression    Diastolic dysfunction    Edema of both lower extremities    Gastric ulcer    GERD (gastroesophageal reflux disease)    Glaucoma    Headache    Hepatic steatosis    Hiatal hernia    HTN (hypertension)    OAB (overactive bladder)    a.) has interstim implant   On dronedarone therapy    On rivaroxaban therapy    Ophthalmoplegic migraine    PAF (paroxysmal atrial fibrillation) (HCC)    a.) CHA2DS2VASc = 6 (age, sex, HTN, CVA/TIA x 2,vascular disease) as of 09/29/2023; b.) rate/rhythm maintained on oral dronaderone + metoprolol succinate; chronically anticoagulated with rivaroxaban   Pneumonia    PONV (postoperative nausea and vomiting)    Pre-diabetes    Primary osteoarthritis of right knee    PSVT (paroxysmal supraventricular tachycardia) (HCC)    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   TIA (transient ischemic attack)    a.) x 8 as of 09/29/2023 per patient report     Transfusion: None.   Consultants (if any):   Discharged Condition: Improved  Hospital Course: LYNDALL BELLOT is an 68 y.o. female who was admitted 10/05/2023 with a diagnosis of degenerative joint disease of the right knee and went to the operating room on 10/05/2023 and underwent the above named procedures.    Surgeries: Procedure(s): ARTHROPLASTY, KNEE, TOTAL on 10/05/2023 Patient tolerated the surgery well. Taken to PACU where she was stabilized and then transferred to the post op recovery area.  She was re-started on her Xarelto 20mg  dail. Heels elevated on bed with rolled towels. No evidence of DVT. Negative Homan. Physical therapy started on day #1 for gait training and transfer. OT started day #1 for ADL and assisted devices.  Patient's IV was removed on POD1.  Implants:  Right TKA using all-cemented Zimmer Persona system with a #7 PCR femur, a(n) E-sized  tibial tray with a 12 mm medial congruent E-poly insert, and a 32 x 8.5 mm all-poly 3-pegged domed patella.  She was given perioperative antibiotics:  Anti-infectives (From admission, onward)    Start     Dose/Rate Route Frequency Ordered Stop   10/05/23 1400  ceFAZolin (ANCEF) IVPB 2g/100 mL premix        2 g 200 mL/hr over 30  Minutes Intravenous Every 6 hours 10/05/23 1123 10/05/23 2007   10/05/23 0600  ceFAZolin (ANCEF) IVPB 2g/100 mL premix        2 g 200 mL/hr over 30 Minutes Intravenous On call to O.R. 10/04/23 2146 10/05/23 0746     .  She was given sequential compression devices, early ambulation, and Xarelto for DVT prophylaxis.  She benefited maximally from the hospital stay and there were no complications.    Recent vital signs:  Vitals:   10/05/23 2140 10/06/23 0252  BP: 126/62 131/69  Pulse: 83 73   Resp: 16 14  Temp: 98.7 F (37.1 C) 97.9 F (36.6 C)  SpO2: 95% 93%    Recent laboratory studies:  Lab Results  Component Value Date   HGB 12.1 09/27/2023   HGB 12.3 02/14/2023   HGB 12.6 04/16/2022   Lab Results  Component Value Date   WBC 5.9 09/27/2023   PLT 195 09/27/2023   Lab Results  Component Value Date   INR 2.3 (H) 04/16/2022   Lab Results  Component Value Date   NA 142 09/27/2023   K 4.0 09/27/2023   CL 103 09/27/2023   CO2 31 09/27/2023   BUN 16 09/27/2023   CREATININE 1.12 (H) 09/27/2023   GLUCOSE 110 (H) 09/27/2023    Discharge Medications:   Allergies as of 10/06/2023       Reactions   Shellfish Allergy Anaphylaxis, Nausea Only, Swelling, Other (See Comments)   Swelling of the throat   Sulfa Antibiotics Hives   Erythromycin Hives   Ibuprofen Other (See Comments)   Vertigo   Adhesive [tape] Rash, Other (See Comments)   Rash with transderm   Amlodipine Hives, Swelling, Rash   Betadine [povidone Iodine] Rash, Other (See Comments)   ALL TOPICAL IODINES   Povidone-iodine Rash, Other (See Comments)   ALL TOPICAL IODINES   Silicone Rash, Other (See Comments)   Rash with transderm   Spironolactone Rash        Medication List     STOP taking these medications    fluticasone 110 MCG/ACT inhaler Commonly known as: FLOVENT HFA       TAKE these medications    acetaminophen 650 MG CR tablet Commonly known as: TYLENOL Take 1,300 mg by mouth in the morning and at bedtime.   albuterol 108 (90 Base) MCG/ACT inhaler Commonly known as: VENTOLIN HFA Inhale 2 puffs into the lungs every 6 (six) hours as needed for wheezing or shortness of breath.   atorvastatin 40 MG tablet Commonly known as: LIPITOR Take 1 tablet (40 mg total) by mouth daily.   clotrimazole-betamethasone cream Commonly known as: LOTRISONE Apply 1 Application topically 2 (two) times daily as needed (for irritation).   diclofenac Sodium 1 % Gel Commonly known as:  VOLTAREN Apply 2 g topically daily as needed (pain).   gabapentin 300 MG capsule Commonly known as: NEURONTIN Take 300-900 mg by mouth See admin instructions. Take 300 mg by mouth in the morning and at noon, and 900 mg at night   hyoscyamine 0.125 MG SL tablet Commonly known as: LEVSIN SL Place 0.125 mg under the tongue 2 (two) times daily as needed for cramping.   metoprolol succinate 25 MG 24 hr tablet Commonly known as: TOPROL-XL TAKE 1/2 TABLET (12.5 MG TOTAL) BY MOUTH AT BEDTIME.   Multaq 400 MG tablet Generic drug: dronedarone Take 1 tablet (400 mg total) by mouth 2 (two) times daily with a meal.   omeprazole 40 MG capsule Commonly  known as: PRILOSEC Take 40 mg by mouth daily before breakfast.   oxyCODONE 5 MG immediate release tablet Commonly known as: Roxicodone Take 1-2 tablets (5-10 mg total) by mouth every 4 (four) hours as needed for moderate pain (pain score 4-6) or severe pain (pain score 7-10).   sertraline 100 MG tablet Commonly known as: ZOLOFT Take 200 mg by mouth daily.   sucralfate 1 g tablet Commonly known as: CARAFATE Take 1 g by mouth daily as needed (to coat the stomach).   triamcinolone 0.1 % paste Commonly known as: KENALOG 1 Application 2 (two) times daily as needed (for fever blisters- apply to the lips).   valsartan-hydrochlorothiazide 320-25 MG tablet Commonly known as: DIOVAN-HCT Take 1 tablet by mouth daily.   Xarelto 20 MG Tabs tablet Generic drug: rivaroxaban TAKE 1 TABLET (20 MG TOTAL) BY MOUTH DAILY WITH SUPPER.       Diagnostic Studies: DG Knee Right Port Result Date: 10/05/2023 CLINICAL DATA:  Status post knee replacement. EXAM: PORTABLE RIGHT KNEE - 1-2 VIEW COMPARISON:  None Available. FINDINGS: Right knee arthroplasty in expected alignment. No periprosthetic lucency or fracture. There has been patellar resurfacing. Recent postsurgical change includes air and edema in the soft tissues and joint space. Anterior skin staples in  place. IMPRESSION: Right knee arthroplasty without immediate postoperative complication. Electronically Signed   By: Narda Rutherford M.D.   On: 10/05/2023 11:20   Disposition: Plan for discharge home today pending progress with PT.    Follow-up Information     Anson [place], PA-C Follow up in 14 day(s).   Specialty: Physician Assistant Why: Mindi Slicker information: [address] [place] [address] [phone]                Signed: Meriel Pica PA-C 10/06/2023, 7:33 AM

## 2023-10-05 NOTE — Anesthesia Preprocedure Evaluation (Addendum)
 Anesthesia Evaluation  Patient identified by MRN, date of birth, ID band Patient awake    Reviewed: Allergy & Precautions, NPO status , Patient's Chart, lab work & pertinent test results  History of Anesthesia Complications Negative for: history of anesthetic complications  Airway Mallampati: II  TM Distance: <3 FB Neck ROM: full    Dental  (+) Dental Advidsory Given, Missing   Pulmonary neg shortness of breath, asthma    Pulmonary exam normal        Cardiovascular Exercise Tolerance: Good hypertension, (-) angina (-) Past MI + dysrhythmias Atrial Fibrillation      Neuro/Psych  PSYCHIATRIC DISORDERS Anxiety Depression    TIA Neuromuscular disease CVA (left side), Residual Symptoms    GI/Hepatic Neg liver ROS, PUD,GERD  Controlled,,  Endo/Other  negative endocrine ROS    Renal/GU      Musculoskeletal   Abdominal   Peds  Hematology negative hematology ROS (+)   Anesthesia Other Findings Past Medical History: No date: A-fib (HCC) No date: Asthma     Comment:  controlled well No date: Bladder prolapse, female, acquired No date: Edema extremities     Comment:  lower legs at times No date: Gastric ulcer No date: GERD (gastroesophageal reflux disease) No date: Glaucoma No date: High blood pressure No date: OAB (overactive bladder) No date: Pre-diabetes No date: Prediabetes  Past Surgical History: 1993: ABDOMINAL HYSTERECTOMY 06/04/2019: ANTERIOR AND POSTERIOR REPAIR; N/A     Comment:  Procedure: CYSTOSCOPY ANTERIOR (CYSTOCELE);  Surgeon:               Alfredo Martinez, MD;  Location: [facility];  Service:               Urology;  Laterality: N/A; No date: APPENDECTOMY 2016: CATARACT EXTRACTION No date: CYSTECTOMY     Comment:  1994 and 1982 No date: EYE SURGERY 04/10/2012: Leane Platt IMPLANT PLACEMENT     Comment:  Procedure: INTERSTIM IMPLANT FIRST STAGE;  Surgeon:               Martina Sinner, MD;   Location: [facility];  Service: Urology;  Laterality: N/A;  rad tech ok              per vicki at main  04/10/2012: INTERSTIM IMPLANT PLACEMENT     Comment:  Procedure: INTERSTIM IMPLANT SECOND STAGE;  Surgeon:               Martina Sinner, MD;  Location: [facility];  Service: Urology;  Laterality: N/A; 1994: LOWER LEG SOFT TISSUE TUMOR EXCISION     Comment:  cyst left ankle -involved muscle removed-limited               mobility now No date: NASAL SEPTUM SURGERY 06/04/2019: PUBOVAGINAL SLING; N/A     Comment:  Procedure: Leonides Grills;  Surgeon: Alfredo Martinez, MD;  Location: [facility];  Service: Urology;                Laterality: N/A;     Reproductive/Obstetrics negative OB ROS                             Anesthesia Physical Anesthesia  Plan  ASA: 3  Anesthesia Plan: General/Spinal   Post-op Pain Management:    Induction:   PONV Risk Score and Plan: 2 and Ondansetron, Dexamethasone, Propofol infusion, TIVA and Midazolam  Airway Management Planned: Natural Airway and Nasal Cannula  Additional Equipment:   Intra-op Plan:   Post-operative Plan:   Informed Consent:   Plan Discussed with:   Anesthesia Plan Comments: (Patient reports no bleeding problems and no anticoagulant use.  Plan for spinal with backup GA  Patient consented for risks of anesthesia including but not limited to:  - adverse reactions to medications - damage to eyes, teeth, lips or other oral mucosa - nerve damage due to positioning  - risk of bleeding, infection and or nerve damage from spinal that could lead to paralysis - risk of headache or failed spinal - damage to teeth, lips or other oral mucosa - sore throat or hoarseness - damage to heart, brain, nerves, lungs, other parts of body or loss of life  Patient voiced understanding and assent.)       Anesthesia Quick  Evaluation

## 2023-10-05 NOTE — Anesthesia Procedure Notes (Signed)
 Spinal  Patient location during procedure: OR Start time: 10/05/2023 7:44 AM Reason for block: surgical anesthesia Staffing Performed: anesthesiologist  Anesthesiologist: Stephanie Coup, MD Performed by: Edmund Hilda, CRNA Authorized by: Stephanie Coup, MD   Preanesthetic Checklist Completed: patient identified, IV checked, site marked, risks and benefits discussed, surgical consent, monitors and equipment checked, pre-op evaluation and timeout performed Spinal Block Patient position: sitting Prep: DuraPrep Patient monitoring: heart rate, cardiac monitor, continuous pulse ox and blood pressure Approach: midline Location: L4-5 Injection technique: single-shot Needle Needle type: Sprotte  Needle gauge: 24 G Needle length: 9 cm Assessment Sensory level: T4 Events: CSF return

## 2023-10-06 ENCOUNTER — Encounter: Payer: Self-pay | Admitting: Surgery

## 2023-10-06 DIAGNOSIS — M1711 Unilateral primary osteoarthritis, right knee: Secondary | ICD-10-CM | POA: Diagnosis not present

## 2023-10-06 NOTE — Evaluation (Addendum)
 Occupational Therapy Evaluation Patient Details Name: Brandy Love MRN: [id number] DOB: 02/10/1956 Today's Date: 10/06/2023   History of Present Illness   Pt is a 68 yo female s/p R TKA. PMHx atrial fibrillation, glaucoma, asthma, GERD, hypertension, bladder prolapse, CVA.     Clinical Impressions Pt seen for OT evaluation this date, POD#1 from above surgery. PTA pt was generally MOD I in ADL, PRN assist for IADL from husband (pt does not drive and they do take out from a restaurant). Pt instructed in polar care mgt, falls prevention strategies, home/routines modifications, DME/AE for LB bathing and dressing tasks, and compression stocking mgt. Dizziness reported following amb to bathroom, Vitals stable. Pt would benefit from skilled OT services including additional instruction in dressing techniques with or without assistive devices for dressing and bathing skills to support recall and carryover prior to discharge and ultimately to maximize safety, independence, and minimize falls risk and caregiver burden. OT will follow acutely to address functional deficits.    If plan is discharge home, recommend the following:   A little help with walking and/or transfers;A little help with bathing/dressing/bathroom     Functional Status Assessment   Patient has had a recent decline in their functional status and demonstrates the ability to make significant improvements in function in a reasonable and predictable amount of time.     Equipment Recommendations   None recommended by OT     Recommendations for Other Services         Precautions/Restrictions   Precautions Precautions: Fall;Knee Precaution Booklet Issued: Yes (comment) Recall of Precautions/Restrictions: Intact Restrictions Weight Bearing Restrictions Per Provider Order: Yes RLE Weight Bearing Per Provider Order: Weight bearing as tolerated     Mobility Bed Mobility Overal bed mobility: Needs Assistance Bed  Mobility: Supine to Sit     Supine to sit: Supervision, HOB elevated, Used rails          Transfers Overall transfer level: Needs assistance Equipment used: Rolling walker (2 wheels) Transfers: Sit to/from Stand Sit to Stand: Contact guard assist                  Balance Overall balance assessment: Needs assistance Sitting-balance support: Feet supported Sitting balance-Leahy Scale: Good     Standing balance support: Bilateral upper extremity supported, During functional activity, Reliant on assistive device for balance Standing balance-Leahy Scale: Good                             ADL either performed or assessed with clinical judgement   ADL Overall ADL's : Needs assistance/impaired Eating/Feeding: Set up;Sitting   Grooming: Sitting;Standing;Supervision/safety           Upper Body Dressing : Set up;Sitting   Lower Body Dressing: Contact guard assist;Sitting/lateral leans Lower Body Dressing Details (indicate cue type and reason): donn/doff socks/slip in shoes Toilet Transfer: Supervision/safety;Rolling walker (2 wheels);Ambulation   Toileting- Clothing Manipulation and Hygiene: Supervision/safety;Sitting/lateral lean Toileting - Clothing Manipulation Details (indicate cue type and reason): continent urine in toilet     Functional mobility during ADLs: Supervision/safety;Contact guard assist;Rolling walker (2 wheels);Cueing for sequencing (approx 15'in room; intermittent vcs for RW technique)       Vision Patient Visual Report: No change from baseline       Perception         Praxis         Pertinent Vitals/Pain Pain Assessment Pain Assessment: 0-10 Pain Score: 4  Pain Location: R  knee Pain Descriptors / Indicators: Aching, Sore, Grimacing, Guarding Pain Intervention(s): Monitored during session, Limited activity within patient's tolerance, Repositioned, Ice applied     Extremity/Trunk Assessment Upper Extremity  Assessment Upper Extremity Assessment: Overall WFL for tasks assessed   Lower Extremity Assessment Lower Extremity Assessment: Defer to PT evaluation       Communication Communication Communication: No apparent difficulties   Cognition Arousal: Alert Behavior During Therapy: WFL for tasks assessed/performed Cognition: No apparent impairments                               Following commands: Intact       Cueing  General Comments   Cueing Techniques: Verbal cues  pt reports dizziness after amb to bathroom, BP 137/89, HR in 90s bpm, spo2 94% on RA   Exercises Other Exercises Other Exercises: edu re: role of OT, role of rehab, discharge recommendations, DME for safe ADL completion   Shoulder Instructions      Home Living Family/patient expects to be discharged to:: Private residence Living Arrangements: Spouse/significant other Available Help at Discharge: Family Type of Home: House Home Access: Stairs to enter Secretary/administrator of Steps: 3 Entrance Stairs-Rails: Left;Right Home Layout: One level     Bathroom Shower/Tub: Producer, television/film/video: Handicapped height Bathroom Accessibility: Yes How Accessible: Accessible via walker Home Equipment: Shower seat - built Charity fundraiser (2 wheels);Rollator (4 wheels);Cane - quad;Grab bars - toilet;Grab bars - tub/shower;BSC/3in1          Prior Functioning/Environment Prior Level of Function : Independent/Modified Independent               ADLs Comments: assist for IADLs from husband as needed    OT Problem List: Decreased activity tolerance;Impaired balance (sitting and/or standing);Decreased knowledge of use of DME or AE   OT Treatment/Interventions: Self-care/ADL training;DME and/or AE instruction;Therapeutic activities;Balance training;Therapeutic exercise;Patient/family education      OT Goals(Current goals can be found in the care plan section)   Acute Rehab OT  Goals Patient Stated Goal: improve function OT Goal Formulation: With patient Time For Goal Achievement: 10/20/23 Potential to Achieve Goals: Good ADL Goals Pt Will Perform Grooming: with modified independence;sitting Pt Will Perform Lower Body Dressing: with modified independence;sitting/lateral leans;sit to/from stand Pt Will Transfer to Toilet: with modified independence;ambulating Pt Will Perform Toileting - Clothing Manipulation and hygiene: with modified independence;sitting/lateral leans;sit to/from stand   OT Frequency:  Min 2X/week    Co-evaluation              AM-PAC OT "6 Clicks" Daily Activity     Outcome Measure Help from another person eating meals?: None Help from another person taking care of personal grooming?: None Help from another person toileting, which includes using toliet, bedpan, or urinal?: None Help from another person bathing (including washing, rinsing, drying)?: A Little Help from another person to put on and taking off regular upper body clothing?: None Help from another person to put on and taking off regular lower body clothing?: A Little 6 Click Score: 22   End of Session Equipment Utilized During Treatment: Rolling walker (2 wheels) Nurse Communication: Mobility status  Activity Tolerance:   Patient left: in chair;with call bell/phone within reach  OT Visit Diagnosis: Other abnormalities of gait and mobility (R26.89)                Time: 7829-5621 OT Time Calculation (min): 25 min Charges:  OT  General Charges $OT Visit: 1 Visit OT Evaluation $OT Eval Low Complexity: 1 Low  Oleta Mouse, OTD OTR/L  10/06/23, 10:45 AM

## 2023-10-06 NOTE — Progress Notes (Signed)
 Physical Therapy Treatment Patient Details Name: Brandy Love MRN: [id number] DOB: October 26, 1955 Today's Date: 10/06/2023   History of Present Illness Pt is a 68 yo female s/p R TKA. PMHx atrial fibrillation, glaucoma, asthma, GERD, hypertension, bladder prolapse, CVA.    PT Comments  Pt was sitting in recliner upon arrival. She is A and O x 4 and agreeable to session. Does endorse having some baseline dizziness. Dizziness did not limit session progression. Pt demonstrated safe abilities to exit bed, stand to RW, and tolerate ambulation ~ 100 ft. She safely performed stairs to simulate home entry. Reviewed importance of HEP, polar care use, positioning, car transfers, and all post acute expectations. Pt is cleared form an acute PT standpoint for safe DC home with HHPT to follow.   If plan is discharge home, recommend the following: A little help with walking and/or transfers;A little help with bathing/dressing/bathroom;Assistance with cooking/housework;Assist for transportation;Help with stairs or ramp for entrance     Equipment Recommendations  None recommended by PT       Precautions / Restrictions Precautions Precautions: Fall;Knee Precaution Booklet Issued: Yes (comment) Recall of Precautions/Restrictions: Intact Restrictions Weight Bearing Restrictions Per Provider Order: Yes RLE Weight Bearing Per Provider Order: Weight bearing as tolerated     Mobility  Bed Mobility  General bed mobility comments: Pt was in recliner pre/post session    Transfers Overall transfer level: Needs assistance Equipment used: Rolling walker (2 wheels) Transfers: Sit to/from Stand Sit to Stand: Supervision  General transfer comment: no physical assistance required to stand or sit to/from recliner 3 x throughout session    Ambulation/Gait Ambulation/Gait assistance: Contact guard assist, Supervision Gait Distance (Feet): 100 Feet Assistive device: Rolling walker (2 wheels) Gait  Pattern/deviations: Step-to pattern, Antalgic Gait velocity: decreased  General Gait Details: pt tolerated ambulation 2 x 100 ft with RW. no LOB or safety concerns with ambulation   Stairs Stairs: Yes Stairs assistance: Supervision Stair Management: Two rails, Step to pattern, Forwards Number of Stairs: 4 General stair comments: pt was able to ascend/descend stairs without safety concerns.   Balance Overall balance assessment: Needs assistance Sitting-balance support: Feet supported Sitting balance-Leahy Scale: Good     Standing balance support: Bilateral upper extremity supported, During functional activity, Reliant on assistive device for balance Standing balance-Leahy Scale: Good     Communication Communication Communication: No apparent difficulties  Cognition Arousal: Alert Behavior During Therapy: WFL for tasks assessed/performed   PT - Cognitive impairments: No apparent impairments    PT - Cognition Comments: Pt was is A and O x 4. Does have some anxiety about DCing home but did well throughout session Following commands: Intact      Cueing Cueing Techniques: Verbal cues         Pertinent Vitals/Pain Pain Assessment Pain Assessment: 0-10 Pain Score: 4  Pain Location: R knee Pain Descriptors / Indicators: Aching, Sore, Grimacing, Guarding Pain Intervention(s): Limited activity within patient's tolerance, Monitored during session, Premedicated before session, Repositioned, Ice applied    Home Living Family/patient expects to be discharged to:: Private residence Living Arrangements: Spouse/significant other Available Help at Discharge: Family Type of Home: House Home Access: Stairs to enter Entrance Stairs-Rails: Lawyer of Steps: 3   Home Layout: One level Home Equipment: Shower seat - built Charity fundraiser (2 wheels);Rollator (4 wheels);Cane - quad;Grab bars - toilet;Grab bars - tub/shower      Prior Function             PT Goals (current goals  can now be found in the care plan section) Acute Rehab PT Goals Patient Stated Goal: to go home Progress towards PT goals: Progressing toward goals    Frequency    BID       AM-PAC PT "6 Clicks" Mobility   Outcome Measure  Help needed turning from your back to your side while in a flat bed without using bedrails?: A Little Help needed moving from lying on your back to sitting on the side of a flat bed without using bedrails?: A Little Help needed moving to and from a bed to a chair (including a wheelchair)?: A Little Help needed standing up from a chair using your arms (e.g., wheelchair or bedside chair)?: A Little Help needed to walk in hospital room?: A Little Help needed climbing 3-5 steps with a railing? : A Little 6 Click Score: 18    End of Session         PT Visit Diagnosis: Other abnormalities of gait and mobility (R26.89);Difficulty in walking, not elsewhere classified (R26.2);Muscle weakness (generalized) (M62.81);Pain Pain - Right/Left: Right Pain - part of body: Knee     Time: 4098-1191 PT Time Calculation (min) (ACUTE ONLY): 20 min  Charges:    $Gait Training: 8-22 mins PT General Charges $$ ACUTE PT VISIT: 1 Visit                     Jetta Lout PTA 10/06/23, 9:57 AM

## 2023-10-06 NOTE — Progress Notes (Signed)
 Patient meets all requirements to be discharged home safely. Patient sister and brother present for discharge instructions. Pain prescription written by Dr. Joice Lofts provided to patient to get filled, ted hose on patient, and extra pair sent home. Polar care filled with ice, to be filled up with water at home. All questions met to patient and family satisfaction.

## 2023-10-06 NOTE — Progress Notes (Signed)
 Subjective: 1 Day Post-Op Procedure(s) (LRB): ARTHROPLASTY, KNEE, TOTAL (Right) Patient reports pain as mild.   Patient is well, and has had no acute complaints or problems Plan is to go Home after hospital stay. Negative for chest pain and shortness of breath Fever: no Gastrointestinal:Negative for nausea and vomiting Reports she is not passing gas yet.  Objective: Vital signs in last 24 hours: Temp:  [97.6 F (36.4 C)-98.8 F (37.1 C)] 97.9 F (36.6 C) (03/28 0252) Pulse Rate:  [69-85] 73 (03/28 0252) Resp:  [13-21] 14 (03/28 0252) BP: (110-143)/(55-69) 131/69 (03/28 0252) SpO2:  [93 %-100 %] 93 % (03/28 0252)  Intake/Output from previous day:  Intake/Output Summary (Last 24 hours) at 10/06/2023 0732 Last data filed at 10/06/2023 0000 Gross per 24 hour  Intake 2558.75 ml  Output 10 ml  Net 2548.75 ml    Intake/Output this shift: No intake/output data recorded.  Labs: No results for input(s): "HGB" in the last 72 hours. No results for input(s): "WBC", "RBC", "HCT", "PLT" in the last 72 hours. No results for input(s): "NA", "K", "CL", "CO2", "BUN", "CREATININE", "GLUCOSE", "CALCIUM" in the last 72 hours. No results for input(s): "LABPT", "INR" in the last 72 hours.   EXAM General - Patient is Alert, Appropriate, and Oriented Extremity - ABD soft Neurovascular intact Dorsiflexion/Plantar flexion intact Incision: scant drainage No cellulitis present Compartment soft Dressing/Incision - Mild bloody drainage noted to the right knee honeycomb dressing.  ACE wrap intact. Motor Function - intact, moving foot and toes well on exam.  Abdomen soft with intact bowel sounds this AM.  Past Medical History:  Diagnosis Date   Anemia    Anxiety    Aortic atherosclerosis (HCC)    Asthma    Bladder prolapse, female, acquired    Cerebral microvascular disease    Depression    Diastolic dysfunction    Edema of both lower extremities    Gastric ulcer    GERD  (gastroesophageal reflux disease)    Glaucoma    Headache    Hepatic steatosis    Hiatal hernia    HTN (hypertension)    OAB (overactive bladder)    a.) has interstim implant   On dronedarone therapy    On rivaroxaban therapy    Ophthalmoplegic migraine    PAF (paroxysmal atrial fibrillation) (HCC)    a.) CHA2DS2VASc = 6 (age, sex, HTN, CVA/TIA x 2,vascular disease) as of 09/29/2023; b.) rate/rhythm maintained on oral dronaderone + metoprolol succinate; chronically anticoagulated with rivaroxaban   Pneumonia    PONV (postoperative nausea and vomiting)    Pre-diabetes    Primary osteoarthritis of right knee    PSVT (paroxysmal supraventricular tachycardia) (HCC)    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   TIA (transient ischemic attack)    a.) x 8 as of 09/29/2023 per patient report    Assessment/Plan: 1 Day Post-Op Procedure(s) (LRB): ARTHROPLASTY, KNEE, TOTAL (Right) Principal Problem:   Status post total knee replacement using cement, right  Estimated body mass index is 37.39 kg/m as calculated from the following:   Height as of this encounter: 5\' 7"  (1.702 m).   Weight as of this encounter: 108.3 kg. Advance diet Up with therapy D/C IV fluids when tolerating po intake.  Vitals reviewed this AM. Up with therapy today. Continue to work on BM. Plan for d/c home today pending progress with PT.  DVT Prophylaxis - Xarelto and TED  hose Weight-Bearing as tolerated to right leg  J. Horris Latino, PA-C [facility] 10/06/2023, 7:32 AM

## 2023-10-16 ENCOUNTER — Emergency Department
Admission: EM | Admit: 2023-10-16 | Discharge: 2023-10-16 | Disposition: A | Discharge status: Home / Self Care | Attending: Emergency Medicine | Admitting: Emergency Medicine

## 2023-10-16 ENCOUNTER — Emergency Department

## 2023-10-16 ENCOUNTER — Other Ambulatory Visit: Payer: Self-pay

## 2023-10-16 DIAGNOSIS — L03115 Cellulitis of right lower limb: Secondary | ICD-10-CM | POA: Insufficient documentation

## 2023-10-16 DIAGNOSIS — I10 Essential (primary) hypertension: Secondary | ICD-10-CM | POA: Diagnosis not present

## 2023-10-16 DIAGNOSIS — Z7901 Long term (current) use of anticoagulants: Secondary | ICD-10-CM | POA: Diagnosis not present

## 2023-10-16 DIAGNOSIS — R2241 Localized swelling, mass and lump, right lower limb: Secondary | ICD-10-CM | POA: Diagnosis present

## 2023-10-16 LAB — COMPREHENSIVE METABOLIC PANEL WITH GFR
ALT: 13 U/L (ref 0–44)
AST: 15 U/L (ref 15–41)
Albumin: 3.3 g/dL — ABNORMAL LOW (ref 3.5–5.0)
Alkaline Phosphatase: 45 U/L (ref 38–126)
Anion gap: 8 (ref 5–15)
BUN: 17 mg/dL (ref 8–23)
CO2: 29 mmol/L (ref 22–32)
Calcium: 8.9 mg/dL (ref 8.9–10.3)
Chloride: 100 mmol/L (ref 98–111)
Creatinine, Ser: 0.98 mg/dL (ref 0.44–1.00)
GFR, Estimated: >60 mL/min (ref >60)
Glucose, Bld: 104 mg/dL — ABNORMAL HIGH (ref 70–99)
Potassium: 3.7 mmol/L (ref 3.5–5.1)
Sodium: 137 mmol/L (ref 135–145)
Total Bilirubin: 0.7 mg/dL (ref 0.0–1.2)
Total Protein: 7 g/dL (ref 6.5–8.1)

## 2023-10-16 LAB — CBC WITH DIFFERENTIAL/PLATELET
Abs Immature Granulocytes: 0.04 10*3/uL (ref 0.00–0.07)
Basophils Absolute: 0.1 10*3/uL (ref 0.0–0.1)
Basophils Relative: 1 %
Eosinophils Absolute: 0.2 10*3/uL (ref 0.0–0.5)
Eosinophils Relative: 2 %
HCT: 34.2 % — ABNORMAL LOW (ref 36.0–46.0)
Hemoglobin: 11 g/dL — ABNORMAL LOW (ref 12.0–15.0)
Immature Granulocytes: 1 %
Lymphocytes Relative: 32 %
Lymphs Abs: 2.8 10*3/uL (ref 0.7–4.0)
MCH: 29.3 pg (ref 26.0–34.0)
MCHC: 32.2 g/dL (ref 30.0–36.0)
MCV: 91 fL (ref 80.0–100.0)
Monocytes Absolute: 0.6 10*3/uL (ref 0.1–1.0)
Monocytes Relative: 7 %
Neutro Abs: 5 10*3/uL (ref 1.7–7.7)
Neutrophils Relative %: 57 %
Platelets: 247 10*3/uL (ref 150–400)
RBC: 3.76 MIL/uL — ABNORMAL LOW (ref 3.87–5.11)
RDW: 13.5 % (ref 11.5–15.5)
WBC: 8.7 10*3/uL (ref 4.0–10.5)
nRBC: 0 % (ref 0.0–0.2)

## 2023-10-16 LAB — LACTIC ACID, PLASMA: Lactic Acid, Venous: 0.7 mmol/L (ref 0.5–1.9)

## 2023-10-16 MED ORDER — CEPHALEXIN 500 MG PO CAPS
500.0000 mg | ORAL_CAPSULE | Freq: Four times a day (QID) | ORAL | 0 refills | Status: AC
Start: 2023-10-16 — End: 2023-10-23

## 2023-10-16 NOTE — Anesthesia Postprocedure Evaluation (Signed)
 Anesthesia Post Note  Patient: Brandy Love  Procedure(s) Performed: ARTHROPLASTY, KNEE, TOTAL (Right: Knee)  Patient location during evaluation: PACU Anesthesia Type: General Level of consciousness: awake and alert Pain management: pain level controlled Vital Signs Assessment: post-procedure vital signs reviewed and stable Respiratory status: spontaneous breathing, nonlabored ventilation, respiratory function stable and patient connected to nasal cannula oxygen Cardiovascular status: blood pressure returned to baseline and stable Postop Assessment: no apparent nausea or vomiting Anesthetic complications: no   No notable events documented.   Last Vitals:  Vitals:   10/06/23 0252 10/06/23 0745  BP: 131/69 (!) 119/53  Pulse: 73 69  Resp: 14 14  Temp: 36.6 C 36.4 C  SpO2: 93% 95%    Last Pain:  Vitals:   10/06/23 1315  TempSrc:   PainSc: 3                  Stephanie Coup

## 2023-10-16 NOTE — ED Notes (Signed)
 See triage notes. Patient c/o right leg swelling, pain and discoloration to the lower leg. Patient had total knee replacement two weeks ago.

## 2023-10-16 NOTE — ED Triage Notes (Signed)
 Patient states total right knee replacement two weeks ago, at 0200 this morning patient noticed swelling, pain and discoloration to right lower leg.

## 2023-10-16 NOTE — ED Notes (Signed)
 Patient transported to Ultrasound

## 2023-10-16 NOTE — ED Notes (Signed)
 Pt is in ultrasound  Family is in room

## 2023-10-16 NOTE — ED Provider Notes (Signed)
 [facility] Provider Note    Event Date/Time   First MD Initiated Contact with Patient 10/16/23 [phone]     (approximate)   History   Leg Swelling   HPI  Brandy Love is a 68 y.o. female history of hypertension, prediabetes, CVA and recent knee replacement 2 weeks ago presents emergency department with redness and swelling of the right leg.  Patient states it starts at the ankle and goes all the way to the knee.  Is on Xarelto.  Does have bruising from the surgery.  Did have some fever and chills last night.  Surgery was performed by Dr.Poggi.      Physical Exam   Triage Vital Signs: ED Triage Vitals  Encounter Vitals Group     BP 10/16/23 0755 (!) 143/76     Systolic BP Percentile --      Diastolic BP Percentile --      Pulse Rate 10/16/23 0755 (!) 57     Resp 10/16/23 0755 18     Temp 10/16/23 0755 97.8 F (36.6 C)     Temp Source 10/16/23 0755 Oral     SpO2 10/16/23 0755 93 %     Weight 10/16/23 0754 233 lb (105.7 kg)     Height 10/16/23 0754 5\' 7"  (1.702 m)     Head Circumference --      Peak Flow --      Pain Score 10/16/23 0754 8     Pain Loc --      Pain Education --      Exclude from Growth Chart --     Most recent vital signs: Vitals:   10/16/23 0755  BP: (!) 143/76  Pulse: (!) 57  Resp: 18  Temp: 97.8 F (36.6 C)  SpO2: 93%     General: Awake, no distress.   CV:  Good peripheral perfusion. regular rate and  rhythm Resp:  Normal effort.  Abd:  No distention.   Other:  Right knee with surgical incision, bandages are in place, redness starts at the right ankle, and extends in a streak up to the right knee along the anterior portion.  See photos in chart.  Bruising noted at the posterior right ankle.  Patient has decreased range of motion, question if from recent surgery versus infection.   ED Results / Procedures / Treatments   Labs (all labs ordered are listed, but only abnormal results are displayed) Labs Reviewed   COMPREHENSIVE METABOLIC PANEL WITH GFR - Abnormal; Notable for the following components:      Result Value   Glucose, Bld 104 (*)    Albumin 3.3 (*)    All other components within normal limits  CBC WITH DIFFERENTIAL/PLATELET - Abnormal; Notable for the following components:   RBC 3.76 (*)    Hemoglobin 11.0 (*)    HCT 34.2 (*)    All other components within normal limits  CULTURE, BLOOD (ROUTINE X 2)  CULTURE, BLOOD (ROUTINE X 2)  LACTIC ACID, PLASMA     EKG     RADIOLOGY Ultrasound for DVT, x-ray of the right knee    PROCEDURES:   Procedures Chief Complaint  Patient presents with   Leg Swelling      MEDICATIONS ORDERED IN ED: Medications - No data to display   IMPRESSION / MDM / ASSESSMENT AND PLAN / ED COURSE  I reviewed the triage vital signs and the nursing notes.  Differential diagnosis includes, but is not limited to, cellulitis, septic joint, postop complication, DVT  Patient's presentation is most consistent with acute illness / injury with system symptoms.   Labs are reassuring, CBC comprehensive metabolic panel and lactic acid reassuring  Ultrasound for DVT, independently reviewed interpreted by me as being negative for any acute abnormality  X-ray of the right knee independently reviewed interpreted by me as being negative for any acute abnormality  Consult to orthopedics, question whether to admit or discharge with oral antibiotics.   Dr. Audelia Acton recommends DVT workup if negative send home with oral antibiotics as she should have a follow-up appointment this week.  Spoke with patient concerning plan.  She is in agreement with plan.  She has had cephalosporins before and did well.  Will treat her with Keflex 500 4 times daily.  Return emergency department worsening.  Keep her appointment for Friday with orthopedics.  She is in agreement treatment plan.  Discharged stable condition.   FINAL CLINICAL IMPRESSION(S)  / ED DIAGNOSES   Final diagnoses:  Cellulitis of right leg     Rx / DC Orders   ED Discharge Orders          Ordered    cephALEXin (KEFLEX) 500 MG capsule  4 times daily        10/16/23 1053             Note:  This document was prepared using Dragon voice recognition software and may include unintentional dictation errors.    Faythe Ghee, PA-C 10/16/23 1055    Jene Every, MD 10/16/23 [phone]

## 2023-10-19 ENCOUNTER — Encounter (HOSPITAL_BASED_OUTPATIENT_CLINIC_OR_DEPARTMENT_OTHER): Payer: Medicare Other | Admitting: Family

## 2023-10-21 LAB — CULTURE, BLOOD (ROUTINE X 2)
Culture: NO GROWTH
Special Requests: ADEQUATE

## 2023-10-30 ENCOUNTER — Other Ambulatory Visit (HOSPITAL_BASED_OUTPATIENT_CLINIC_OR_DEPARTMENT_OTHER): Payer: Self-pay | Admitting: Cardiovascular Disease

## 2023-10-30 ENCOUNTER — Ambulatory Visit: Payer: Medicare Other | Admitting: Gastroenterology

## 2023-11-20 ENCOUNTER — Ambulatory Visit (INDEPENDENT_AMBULATORY_CARE_PROVIDER_SITE_OTHER): Admitting: Gastroenterology

## 2023-11-20 VITALS — BP 116/72 | HR 82 | Temp 97.7°F | Wt 228.0 lb

## 2023-11-20 DIAGNOSIS — K648 Other hemorrhoids: Secondary | ICD-10-CM | POA: Diagnosis not present

## 2023-11-20 DIAGNOSIS — R1011 Right upper quadrant pain: Secondary | ICD-10-CM | POA: Diagnosis not present

## 2023-11-20 DIAGNOSIS — R197 Diarrhea, unspecified: Secondary | ICD-10-CM

## 2023-11-20 DIAGNOSIS — K625 Hemorrhage of anus and rectum: Secondary | ICD-10-CM

## 2023-11-20 NOTE — Progress Notes (Signed)
 Luke Salaam MD, MRCP(U.K) [address]  [address]  [place], [place] [address]  Main: [phone]  Fax: [phone]   Primary Care Physician: Roselind Congo, MD  Primary Gastroenterologist:  Dr. Luke Salaam   Chief Complaint  Patient presents with   History of rectal bleeding    HPI: Brandy Love is a 68 y.o. female   Summary of history :  She says that she was seen recently in the emergency room for an episode of rectal bleeding.  This was back in 02/14/2023 she says that it has not recurred significantly since but has noticed blood with the stool on multiple other occasions.  She says that her last colonoscopy was over 5 years back.  She complains of occasional right upper quadrant pain not related to meals not related to food intake nonspecific no clear aggravating or relieving factors.  On Xarelto .   02/14/2023 hemoglobin 12.3 g  Interval history 08/01/2023-11/20/2023  08/05/2023: RUQ USG: steatosis 08/16/2023: Colonoscopy :4 small polyps resectedx 4 tubular adenomas , internal hemorroids.    Denies any blood in the stool presently but she has been having loose stools up to 8 bowel movements per day for the past 8 weeks.  Denies any artificial sugars or sweeteners.  He has a lot of rumbling sounds in her belly.  Denies any consumption of any laxatives sweet tea diet soda chewing gum.  No NSAID use either.  Also complains of right upper quadrant pain associated with food intake. Current Outpatient Medications  Medication Sig Dispense Refill   acetaminophen  (TYLENOL ) 650 MG CR tablet Take 1,300 mg by mouth in the morning and at bedtime.     albuterol  (PROVENTIL  HFA;VENTOLIN  HFA) 108 (90 Base) MCG/ACT inhaler Inhale 2 puffs into the lungs every 6 (six) hours as needed for wheezing or shortness of breath.     atorvastatin  (LIPITOR) 40 MG tablet Take 1 tablet (40 mg total) by mouth daily. 90 tablet 3   clotrimazole-betamethasone  (LOTRISONE) cream Apply 1 Application topically  2 (two) times daily as needed (for irritation).     diclofenac Sodium (VOLTAREN) 1 % GEL Apply 2 g topically daily as needed (pain).     dronedarone  (MULTAQ ) 400 MG tablet Take 1 tablet (400 mg total) by mouth 2 (two) times daily with a meal. 36 tablet    gabapentin  (NEURONTIN ) 300 MG capsule Take 300-900 mg by mouth See admin instructions. Take 300 mg by mouth in the morning and at noon, and 900 mg at night     hyoscyamine  (LEVSIN SL) 0.125 MG SL tablet Place 0.125 mg under the tongue 2 (two) times daily as needed for cramping.     metoprolol  succinate (TOPROL -XL) 25 MG 24 hr tablet TAKE 1/2 TABLET (12.5 MG TOTAL) BY MOUTH AT BEDTIME. 45 tablet 2   omeprazole (PRILOSEC) 40 MG capsule Take 40 mg by mouth daily before breakfast.     oxyCODONE  (ROXICODONE ) 5 MG immediate release tablet Take 1-2 tablets (5-10 mg total) by mouth every 4 (four) hours as needed for moderate pain (pain score 4-6) or severe pain (pain score 7-10). 40 tablet 0   rivaroxaban  (XARELTO ) 20 MG TABS tablet TAKE 1 TABLET (20 MG TOTAL) BY MOUTH DAILY WITH SUPPER. 30 tablet 5   sertraline  (ZOLOFT ) 100 MG tablet Take 200 mg by mouth daily.     sucralfate  (CARAFATE ) 1 g tablet Take 1 g by mouth daily as needed (to coat the stomach).     triamcinolone  (KENALOG ) 0.1 %  paste 1 Application 2 (two) times daily as needed (for fever blisters- apply to the lips).     valsartan -hydrochlorothiazide  (DIOVAN -HCT) 320-25 MG tablet TAKE 1 TABLET BY MOUTH DAILY. STOP HYDROCHLOROTHIAZIDE  AND LOSARTAN  90 tablet 2   No current facility-administered medications for this visit.    Allergies as of 11/20/2023 - Review Complete 11/20/2023  Allergen Reaction Noted   Shellfish allergy Anaphylaxis, Nausea Only, Swelling, and Other (See Comments) 05/23/2019   Sulfa antibiotics Hives 04/14/2022   Erythromycin Hives 10/26/2021   Ibuprofen Other (See Comments)    Adhesive [tape] Rash and Other (See Comments) 04/06/2012   Amlodipine Hives, Swelling, and Rash  10/17/2017   Betadine [povidone iodine] Rash and Other (See Comments) 01/07/2015   Povidone-iodine Rash and Other (See Comments) 01/07/2015   Silicone Rash and Other (See Comments) 04/06/2012   Spironolactone  Rash 06/29/2023      ROS:  General: Negative for anorexia, weight loss, fever, chills, fatigue, weakness. ENT: Negative for hoarseness, difficulty swallowing , nasal congestion. CV: Negative for chest pain, angina, palpitations, dyspnea on exertion, peripheral edema.  Respiratory: Negative for dyspnea at rest, dyspnea on exertion, cough, sputum, wheezing.  GI: See history of present illness. GU:  Negative for dysuria, hematuria, urinary incontinence, urinary frequency, nocturnal urination.  Endo: Negative for unusual weight change.    Physical Examination:   BP 116/72   Pulse 82   Temp 97.7 F (36.5 C) (Oral)   Wt 228 lb (103.4 kg)   BMI 35.71 kg/m   General: Well-nourished, well-developed in no acute distress.  Eyes: No icterus. Conjunctivae pink. Mouth: Oropharyngeal mucosa moist and pink , no lesions erythema or exudate. Lungs: Clear to auscultation bilaterally. Non-labored. Heart: Regular rate and rhythm, no murmurs rubs or gallops.  Abdomen: Bowel sounds are normal, nontender, nondistended, no hepatosplenomegaly or masses, no abdominal bruits or hernia , no rebound or guarding.   Extremities: No lower extremity edema. No clubbing or deformities.  Psych: Alert and cooperative, normal mood and affect.   Imaging Studies: No results found.  Assessment and Plan:   Brandy Love is a 68 y.o. y/o female here to follow up for rectal bleeding.  Single episode back in 02/28/2023.  Since no further episodes treat hemorrhoids conservatively.  Occasional episodes subsequently.  Nonspecific right upper quadrant discomfort although it is worse after meals which could indicate biliary colic.  She is also having acute diarrhea.   Plan 1.  Obtain stool studies 2.  HIDA scan  for right upper quadrant pain 3.  Conservative management of internal hemorrhoids  She lives close to [place] suggested to follow-up with Brigitte Canard in 4 weeks at [facility] gastroenterology    Dr Luke Salaam  MD,MRCP [facility])

## 2023-11-20 NOTE — Patient Instructions (Signed)
 For your HIDA Scan, please do not eat or drink after midnight the night before.   If you take any type of Opiods (pain medication) please stop it the night before.   If you need to reschedule, please call [phone].

## 2023-11-23 ENCOUNTER — Other Ambulatory Visit: Payer: Self-pay | Admitting: Gastroenterology

## 2023-11-27 LAB — GI PROFILE, STOOL, PCR

## 2023-11-27 LAB — C DIFFICILE, CYTOTOXIN B

## 2023-11-27 LAB — CALPROTECTIN, FECAL: Calprotectin, Fecal: 310 ug/g — ABNORMAL HIGH (ref 0–120)

## 2023-11-27 LAB — C DIFFICILE TOXINS A+B W/RFLX: C difficile Toxins A+B, EIA: NEGATIVE

## 2023-11-29 ENCOUNTER — Other Ambulatory Visit: Payer: Self-pay

## 2023-11-29 MED ORDER — RIFAXIMIN 550 MG PO TABS: 550.0000 mg | ORAL_TABLET | Freq: Three times a day (TID) | ORAL | 0 refills | Status: AC

## 2023-12-01 ENCOUNTER — Ambulatory Visit (HOSPITAL_COMMUNITY)
Admission: RE | Admit: 2023-12-01 | Discharge: 2023-12-01 | Disposition: A | Discharge status: Home / Self Care | Source: Ambulatory Visit | Attending: Gastroenterology

## 2023-12-01 DIAGNOSIS — K625 Hemorrhage of anus and rectum: Secondary | ICD-10-CM | POA: Diagnosis present

## 2023-12-01 DIAGNOSIS — R1011 Right upper quadrant pain: Secondary | ICD-10-CM | POA: Insufficient documentation

## 2023-12-01 MED ORDER — TECHNETIUM TC 99M MEBROFENIN IV KIT
5.0000 | PACK | Freq: Once | INTRAVENOUS | Status: AC | PRN
  Administered 2023-12-01: 5 via INTRAVENOUS

## 2023-12-02 ENCOUNTER — Other Ambulatory Visit: Payer: Self-pay | Admitting: Cardiovascular Disease

## 2023-12-05 NOTE — Telephone Encounter (Signed)
 Pt last saw Neomi Banks, NP on 03/30/23, last labs 10/16/23 Creat 0.98, age 68, weight 103.4, CrCl 90.93, based on CrCl pt is on appropriate dosage of Xarelto  20mg  every day for afib.  Will refill rx.

## 2023-12-07 ENCOUNTER — Telehealth: Payer: Self-pay

## 2023-12-07 NOTE — Telephone Encounter (Signed)
 Called patient's insurance and ask if patient's medication (Xifaxan) was approved or denied. They stated that they were denied. Therefore, I requested for an appeal form to be faxed so I could fill out and fax back/expedited.

## 2023-12-21 ENCOUNTER — Encounter (HOSPITAL_BASED_OUTPATIENT_CLINIC_OR_DEPARTMENT_OTHER): Payer: Self-pay | Admitting: Family

## 2023-12-21 ENCOUNTER — Ambulatory Visit (HOSPITAL_BASED_OUTPATIENT_CLINIC_OR_DEPARTMENT_OTHER): Admitting: Family

## 2023-12-21 VITALS — BP 113/76 | HR 69 | Ht 67.0 in | Wt 239.1 lb

## 2023-12-21 DIAGNOSIS — D6859 Other primary thrombophilia: Secondary | ICD-10-CM

## 2023-12-21 DIAGNOSIS — Z8673 Personal history of transient ischemic attack (TIA), and cerebral infarction without residual deficits: Secondary | ICD-10-CM

## 2023-12-21 DIAGNOSIS — I480 Paroxysmal atrial fibrillation: Secondary | ICD-10-CM

## 2023-12-21 DIAGNOSIS — R600 Localized edema: Secondary | ICD-10-CM | POA: Diagnosis not present

## 2023-12-21 DIAGNOSIS — I10 Essential (primary) hypertension: Secondary | ICD-10-CM | POA: Diagnosis not present

## 2023-12-21 DIAGNOSIS — E782 Mixed hyperlipidemia: Secondary | ICD-10-CM

## 2023-12-21 MED ORDER — FUROSEMIDE 20 MG PO TABS: ORAL_TABLET | ORAL | 0 refills | Status: DC

## 2023-12-21 NOTE — Progress Notes (Signed)
 Advanced Hypertension Clinic Assessment:    Date:  12/21/2023   ID:  Brandy Love, DOB August 16, 1955, MRN [id number]  PCP:  Roselind Congo, MD  Cardiologist:  Maudine Sos, MD  Nephrologist:  Referring MD: Roselind Congo, MD   CC: Hypertension  History of Present Illness:    Brandy Love is a 68 y.o. female with a hx of PAF, CVA (04/2022), HTN, asthma, gastric ulcer, GERD here to follow up  in the Advanced Hypertension Clinic.   Presented to the ED 01/27/2022 with palpitations diagnosed with atrial fibrillation started on metoprolol  and OAC.  Subsequent monitor showed 2% atrial fibrillation burden and 1.8% SV ectopy.  Has since followed with atrial fibrillation clinic and been started on Multaq .  04/16/22 presented to ED with thalamic stroke started on aspirin  in conjunction to Xarelto  and statin.  Established with Advanced Hypertension Clinic 11/07/22 with BP uncontrolled on multiple agents. Initially diagnosed with hypertension in 2011. At her visit, Losartan  and hydrochlorothiazide  were stopped and instead started on Valsartan /hydrochlorothiazide  320/25mg  daily as well as Spironolactone  25mg  daily. Renal duplex 12/12/22 with no stenosis. Renin-aldosterone not consistent with hyperaldosteronism.   She was last seen 02/02/2023 with home systolic BP 109-113 with occasional lightheadedness with position changes.  Spironolactone  was discontinued.  She was recommended to continue valsartan -HCTZ 320-25 mg daily.  ED visit 02/14/2023 with GI bleeding due to hemorrhoids.  She saw general surgery and was advised for medication to help manage constipation/diarrhea which were felt to be exacerbating her hemorrhoidal bleeding.  At visit 03/2023 BP average at home 135/70. Due to prior lightheadedness with relative hypotension, present regimen continued and lifestyle changes encouraged. Phone visit 07/2023 and given preop clearance for knee surgery which was performed 10/05/23.   Discussed the use  of AI scribe software for clinical note transcription with the patient, who gave verbal consent to proceed.    Brandy Love is a 68 year old female with atrial fibrillation and hypertension who presents with palpitations and knee swelling post-surgery.  She experiences palpitations described as 'flutters' occurring variably throughout the week. Her last monitoring in August 2023 showed atrial fibrillation 2% of the time. She is on Multaq  for management and is concerned about distinguishing between atrial fibrillation, anxiety, and early beats. She does not regularly check her blood pressure or heart rate at home but notes stable blood pressure since her knee surgery, with systolic readings in the 120s and diastolic in the 70s to 80s at clinic visits. Attributes improvement in BP to reduced pain post knee surgery. No recent lightheadedness or dizziness. She experiences occasional lightheadedness upon standing, attributed to her method of rising quickly. She is on Xarelto  and denies any recent bleeding issues, though she has experienced minor hemorrhoidal bleeding. She notes RLE edema below site of knee surgery.      Previous antihypertensives: Amlodipine - hives, swelling   Past Medical History:  Diagnosis Date   Anemia    Anxiety    Aortic atherosclerosis (HCC)    Asthma    Bladder prolapse, female, acquired    Cerebral infarction, unspecified (HCC) 04/14/2022   Cerebral microvascular disease    Complex tear of medial meniscus of right knee 10/05/2023   Depression    Diastolic dysfunction    Edema of both lower extremities    Gastric ulcer    GERD (gastroesophageal reflux disease)    Glaucoma    Headache    Hepatic steatosis    Hiatal hernia    HTN (  hypertension)    OAB (overactive bladder)    a.) has interstim implant   On dronedarone  therapy    On rivaroxaban  therapy    Ophthalmoplegic migraine    PAF (paroxysmal atrial fibrillation) (HCC)    a.) CHA2DS2VASc = 6 (age, sex,  HTN, CVA/TIA x 2,vascular disease) as of 09/29/2023; b.) rate/rhythm maintained on oral dronaderone + metoprolol  succinate; chronically anticoagulated with rivaroxaban    Pneumonia    PONV (postoperative nausea and vomiting)    Pre-diabetes    Primary osteoarthritis of right knee    PSVT (paroxysmal supraventricular tachycardia) (HCC)    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   TIA (transient ischemic attack)    a.) x 8 as of 09/29/2023 per patient report    Past Surgical History:  Procedure Laterality Date   ABDOMINAL HYSTERECTOMY  1993   ANTERIOR AND POSTERIOR REPAIR N/A 06/04/2019   Procedure: CYSTOSCOPY ANTERIOR (CYSTOCELE);  Surgeon: Erman Hayward, MD;  Location: [facility];  Service: Urology;  Laterality: N/A;   APPENDECTOMY     CATARACT EXTRACTION  2016   COLONOSCOPY WITH PROPOFOL  N/A 08/16/2023   Procedure: COLONOSCOPY WITH PROPOFOL ;  Surgeon: Luke Salaam, MD;  Location: [facility] ENDOSCOPY;  Service: Gastroenterology;  Laterality: N/A;   cyst opened and drained on ovary     CYSTECTOMY     1994 and 1982   ESOPHAGOGASTRODUODENOSCOPY     EYE SURGERY     INTERSTIM IMPLANT PLACEMENT  04/10/2012   Procedure: Simona Dublin IMPLANT FIRST STAGE;  Surgeon: Devorah Fonder, MD;  Location: [facility];  Service: Urology;  Laterality: N/A;  rad tech ok per vicki at main    INTERSTIM IMPLANT PLACEMENT  04/10/2012   Procedure: INTERSTIM IMPLANT SECOND STAGE;  Surgeon: Devorah Fonder, MD;  Location: [facility];  Service: Urology;  Laterality: N/A;   LOWER LEG SOFT TISSUE TUMOR EXCISION  1994   cyst left ankle -involved muscle removed-limited mobility now   NASAL SEPTUM SURGERY     POLYPECTOMY  08/16/2023   Procedure: POLYPECTOMY;  Surgeon: Luke Salaam, MD;  Location: [facility] ENDOSCOPY;  Service: Gastroenterology;;   Jerrold Morgan SLING N/A 06/04/2019   Procedure:  Gino Lais;  Surgeon: Erman Hayward, MD;  Location: [facility];  Service: Urology;  Laterality: N/A;   TOTAL KNEE ARTHROPLASTY Right 10/05/2023   Procedure: ARTHROPLASTY, KNEE, TOTAL;  Surgeon: Elner Hahn, MD;  Location: [facility] ORS;  Service: Orthopedics;  Laterality: Right;    Current Medications: Current Meds  Medication Sig   acetaminophen  (TYLENOL ) 650 MG CR tablet Take 1,300 mg by mouth in the morning and at bedtime.   albuterol  (PROVENTIL  HFA;VENTOLIN  HFA) 108 (90 Base) MCG/ACT inhaler Inhale 2 puffs into the lungs every 6 (six) hours as needed for wheezing or shortness of breath.   atorvastatin  (LIPITOR) 40 MG tablet Take 1 tablet (40 mg total) by mouth daily.   clotrimazole-betamethasone  (LOTRISONE) cream Apply 1 Application topically 2 (two) times daily as needed (for irritation).   diclofenac Sodium (VOLTAREN) 1 % GEL Apply 2 g topically daily as needed (pain).   dronedarone  (MULTAQ ) 400 MG tablet Take 1 tablet (400 mg total) by mouth 2 (two) times daily with a meal.   gabapentin  (NEURONTIN ) 300 MG capsule Take 300-900 mg by mouth See admin instructions. Take 300 mg by mouth in the morning and at noon, and 900 mg at night   hyoscyamine  (LEVSIN  SL) 0.125 MG  SL tablet Place 0.125 mg under the tongue 2 (two) times daily as needed for cramping.   meclizine (ANTIVERT) 12.5 MG tablet Take 12.5 mg by mouth 2 (two) times daily.   metoprolol  succinate (TOPROL -XL) 25 MG 24 hr tablet TAKE 1/2 TABLET (12.5 MG TOTAL) BY MOUTH AT BEDTIME.   omeprazole (PRILOSEC) 40 MG capsule Take 40 mg by mouth daily before breakfast.   sertraline  (ZOLOFT ) 100 MG tablet Take 200 mg by mouth daily.   sucralfate  (CARAFATE ) 1 g tablet Take 1 g by mouth daily as needed (to coat the stomach).   triamcinolone  (KENALOG ) 0.1 % paste 1 Application 2 (two) times daily as needed (for fever blisters- apply to the lips).   valsartan -hydrochlorothiazide  (DIOVAN -HCT) 320-25 MG tablet TAKE 1 TABLET BY MOUTH DAILY. STOP  HYDROCHLOROTHIAZIDE  AND LOSARTAN    XARELTO  20 MG TABS tablet TAKE 1 TABLET (20 MG TOTAL) BY MOUTH DAILY WITH SUPPER.   XIFAXAN  550 MG TABS tablet Take 550 mg by mouth 3 (three) times daily.     Allergies:   Shellfish allergy, Sulfa antibiotics, Erythromycin, Ibuprofen, Adhesive [tape], Amlodipine, Betadine [povidone iodine], Povidone-iodine, Silicone, and Spironolactone    Social History   Socioeconomic History   Marital status: Married    Spouse name: Chief Operating Officer   Number of children: 3   Years of education: Not on file   Highest education level: Not on file  Occupational History   Occupation: Engineer, building services    Occupation: Retired from [employer]  Tobacco Use   Smoking status: Never    Passive exposure: Never   Smokeless tobacco: Never  Vaping Use   Vaping status: Never Used  Substance and Sexual Activity   Alcohol use: No   Drug use: No   Sexual activity: Yes  Other Topics Concern   Not on file  Social History Narrative   Not on file   Social Drivers of Health   Financial Resource Strain: Not on file  Food Insecurity: No Food Insecurity (10/05/2023)   Hunger Vital Sign    Worried About Running Out of Food in the Last Year: Never true    Ran Out of Food in the Last Year: Never true  Transportation Needs: No Transportation Needs (10/05/2023)   PRAPARE - Administrator, Civil Service (Medical): No    Lack of Transportation (Non-Medical): No  Physical Activity: Inactive (11/07/2022)   Exercise Vital Sign    Days of Exercise per Week: 0 days    Minutes of Exercise per Session: 0 min  Stress: Not on file  Social Connections: Moderately Isolated (10/05/2023)   Social Connection and Isolation Panel    Frequency of Communication with Friends and Family: More than three times a week    Frequency of Social Gatherings with Friends and Family: Twice a week    Attends Religious Services: Never    Database administrator or Organizations: No    Attends Museum/gallery exhibitions officer: Never    Marital Status: Married     Family History: The patient's family history includes Asthma in her mother; Atrial fibrillation in her brother; Diabetes in her mother; Heart disease in her father; Hernia in her mother; Obesity in her mother; Prostate cancer in her father.  ROS:   Please see the history of present illness.    All other systems reviewed and are negative.  EKGs/Labs/Other Studies Reviewed:         Recent Labs: 10/16/2023: ALT 13; BUN 17; Creatinine, Ser 0.98; Hemoglobin 11.0; Platelets  247; Potassium 3.7; Sodium 137   Recent Lipid Panel    Component Value Date/Time   CHOL 181 04/14/2022 2218   CHOL 189 07/29/2019 1455   TRIG 65 04/14/2022 2218   HDL 49 04/14/2022 2218   HDL 43 07/29/2019 1455   CHOLHDL 3.7 04/14/2022 2218   VLDL 13 04/14/2022 2218   LDLCALC 119 (H) 04/14/2022 2218   LDLCALC 125 (H) 07/29/2019 1455    Physical Exam:   VS:  BP 113/76 (BP Location: Right Arm, Patient Position: Sitting, Cuff Size: Large)   Pulse 69   Ht 5' 7 (1.702 m)   Wt 239 lb 1.6 oz (108.5 kg)   SpO2 95%   BMI 37.45 kg/m  , BMI Body mass index is 37.45 kg/m. GENERAL:  Well appearing HEENT: Pupils equal round and reactive, fundi not visualized, oral mucosa unremarkable NECK:  No jugular venous distention, waveform within normal limits, carotid upstroke brisk and symmetric, no bruits, no thyromegaly LYMPHATICS:  No cervical adenopathy LUNGS:  Clear to auscultation bilaterally HEART:  RRR.  PMI not displaced or sustained,S1 and S2 within normal limits, no S3, no S4, no clicks, no rubs, no murmurs ABD:  Flat, positive bowel sounds normal in frequency in pitch, no bruits, no rebound, no guarding, no midline pulsatile mass, no hepatomegaly, no splenomegaly EXT:  2 plus pulses throughout, nonpitting RLE edema, no cyanosis no clubbing SKIN:  No rashes no nodules NEURO:  Cranial nerves II through XII grossly intact, motor grossly intact  throughout PSYCH:  Cognitively intact, oriented to person place and time   ASSESSMENT/PLAN:        Atrial Fibrillation / Hypercoagulable state Intermittent palpitations with history of AFib. EKG in March showed sinus rhythm. Heart rate 69 bpm, RRR on auscultation. Discussed home EKG monitoring options and Multaq  effectiveness. - Consider Kardia device or Zio monitor if palpitations increase.  Hypertension Blood pressure at goal <130/80. Discussed lightheadedness with position changes and potential hydrochlorothiazide  adjustment. - Monitor blood pressure and lightheadedness. - Consider reducing hydrochlorothiazide  (adjust from Valsartan -HCTZ 320-25 to 320-12.5 dose) if symptoms worsen. She politely declines medication change today.   Post-surgical knee swelling Swelling post-surgery, exacerbated by sitting. Discussed compression wraps. - Prescribe Furosemide  20mg  daily x 2-3 days to improve swelling though suspect most swelling is post surgical. - Advise splitting valsartan /hydrochlorothiazide  in half while on Furosemide  to prevent hypotension. - Recommend compression wraps below scar. - Advise leg elevation.  Hemorrhoids Minor bleeding attributed to hemorrhoids. Follows with GI. Discussed potential intervention if bleeding persists. - Monitor for persistent or significant bleeding. - Consider hemorrhoid intervention if problematic. -Previously discussed Watchman, as only intermittent mild hemorrhoidal bleeding and normal Hb will defer at this time.     History of CVA / HLD, LDL goal <55  Continue Atorvastatin  40 mg daily.  LDL goal less than 55 given history of stroke, age, hypertension.   Screening for Secondary Hypertension:     11/07/2022    2:55 PM  Causes  Drugs/Herbals Screened     - Comments limits salt.  no caffeine/EtOH.  Renovascular HTN Screened  Sleep Apnea Screened  Thyroid  Disease Screened  Hyperaldosteronism Screened     - Comments check renin/aldosterone   Pheochromocytoma N/A  Cushing's Syndrome N/A  Hyperparathyroidism Screened  Coarctation of the Aorta Screened     - Comments BP symmetric  Compliance Screened    Relevant Labs/Studies:    Latest Ref Rng & Units 10/16/2023    9:50 AM 09/27/2023   11:55 AM 02/14/2023  3:15 PM  Basic Labs  Sodium 135 - 145 mmol/L 137  142  140   Potassium 3.5 - 5.1 mmol/L 3.7  4.0  3.6   Creatinine 0.44 - 1.00 mg/dL 1.61  0.96  0.45        Latest Ref Rng & Units 07/29/2019    2:55 PM 10/17/2017    9:38 AM  Thyroid    TSH 0.450 - 4.500 uIU/mL 2.350  2.920        Latest Ref Rng & Units 11/17/2022   11:04 AM  Renin/Aldosterone   Aldosterone 0.0 - 30.0 ng/dL 40.9   Aldos/Renin Ratio 0.0 - 30.0 0.5              12/12/2022   10:54 AM  Renovascular   Renal Artery US  Completed Yes     Disposition:    FU with MD/PharmD in 4 months    Medication Adjustments/Labs and Tests Ordered: Current medicines are reviewed at length with the patient today.  Concerns regarding medicines are outlined above.  No orders of the defined types were placed in this encounter.  No orders of the defined types were placed in this encounter.    Signed, Clearnce Curia, NP  12/21/2023 9:05 AM    [facility]

## 2023-12-21 NOTE — Patient Instructions (Addendum)
 Medication Instructions:   START Furosemide (Lasix) 20mg  daily for 2-3 days  On the days you take Furosemide (Lasix) please split your Valsartan -hydrochlorothiazide  in half      Follow-Up: Please follow up in 4  months in ADV HTN CLINIC with Dr. Theodis Fiscal, Neomi Banks, NP or Donivan Furry PharmD    Special Instructions:    If you are persistently getting lightheaded, let us  know and we can consider reducing your Valsartan -hydrochlorothiazide  to 320-12.5mg  daily.   To prevent or reduce lower extremity swelling: Eat a low salt diet. Salt makes the body hold onto extra fluid which causes swelling. Sit with legs elevated. For example, in the recliner or on an ottoman.  Wear knee-high compression stockings during the daytime. Ones labeled 15-20 mmHg provide good compression.   To prevent palpitations: Make sure you are adequately hydrated.  Avoid and/or limit caffeine containing beverages like soda or tea. Exercise regularly.  Manage stress well. Some over the counter medications can cause palpitations such as Benadryl , AdvilPM, TylenolPM. Regular Advil or Tylenol  do not cause palpitations.

## 2023-12-23 ENCOUNTER — Encounter (HOSPITAL_BASED_OUTPATIENT_CLINIC_OR_DEPARTMENT_OTHER): Payer: Self-pay | Admitting: Family

## 2024-02-01 ENCOUNTER — Encounter (HOSPITAL_BASED_OUTPATIENT_CLINIC_OR_DEPARTMENT_OTHER): Payer: Self-pay

## 2024-02-01 DIAGNOSIS — I480 Paroxysmal atrial fibrillation: Secondary | ICD-10-CM

## 2024-02-02 ENCOUNTER — Other Ambulatory Visit (HOSPITAL_BASED_OUTPATIENT_CLINIC_OR_DEPARTMENT_OTHER): Payer: Self-pay | Admitting: Family

## 2024-02-02 DIAGNOSIS — I480 Paroxysmal atrial fibrillation: Secondary | ICD-10-CM

## 2024-02-02 MED ORDER — MULTAQ 400 MG PO TABS: 400.0000 mg | ORAL_TABLET | Freq: Two times a day (BID) | ORAL | 2 refills | Status: DC

## 2024-02-02 NOTE — Telephone Encounter (Signed)
 Potential high risk medication interaction with Xarelto  and Multaq .  Reviewed by pharmacy team, According to UpToDate, if the CrCl is from 15-80 they can have increased bleed risk. Her CrCl with actual body weight is 95, but if you use adjusted body weight, it's 71. Most of the Xarelto  info from the manufacture likes to use actual body weight. But I think it's worth refilling for now, and get her an appointment with AF in the next 90 days to let them decide  30 day prescription with 2 refills provided.   Will route to AFib clinic for input.   Brandy Bartee S Nieve Rojero, NP

## 2024-02-05 MED ORDER — MULTAQ 400 MG PO TABS
400.0000 mg | ORAL_TABLET | Freq: Two times a day (BID) | ORAL | 5 refills | Status: AC
Start: 2024-02-05 — End: ?

## 2024-02-05 NOTE — Telephone Encounter (Signed)
 Per AFib clinic team, okay to continue Xarelto  and Multaq  if no bleeding issues. EKG q6 mos for Multaq  use. Can re-refer to Afib clinic if needed. Given presence of AAD, would recommend follow with AFib clinic to allow us  to focus on her hypertension in Advanced Hypertension Clinic.   Will make Afib team aware as well as Brandy Love.  Emeli Goguen S Zailah Zagami, NP

## 2024-04-02 ENCOUNTER — Encounter (HOSPITAL_BASED_OUTPATIENT_CLINIC_OR_DEPARTMENT_OTHER): Payer: Self-pay

## 2024-04-04 ENCOUNTER — Ambulatory Visit (HOSPITAL_BASED_OUTPATIENT_CLINIC_OR_DEPARTMENT_OTHER): Admitting: Family

## 2024-04-04 ENCOUNTER — Encounter (HOSPITAL_BASED_OUTPATIENT_CLINIC_OR_DEPARTMENT_OTHER): Payer: Self-pay | Admitting: Family

## 2024-04-04 ENCOUNTER — Ambulatory Visit: Attending: Family

## 2024-04-04 VITALS — BP 106/66 | HR 73 | Ht 67.0 in | Wt 207.6 lb

## 2024-04-04 DIAGNOSIS — D6859 Other primary thrombophilia: Secondary | ICD-10-CM

## 2024-04-04 DIAGNOSIS — Z8673 Personal history of transient ischemic attack (TIA), and cerebral infarction without residual deficits: Secondary | ICD-10-CM

## 2024-04-04 DIAGNOSIS — I480 Paroxysmal atrial fibrillation: Secondary | ICD-10-CM

## 2024-04-04 DIAGNOSIS — I10 Essential (primary) hypertension: Secondary | ICD-10-CM

## 2024-04-04 MED ORDER — VALSARTAN-HYDROCHLOROTHIAZIDE 320-12.5 MG PO TABS: 1.0000 | ORAL_TABLET | Freq: Every day | ORAL | 5 refills | Status: AC

## 2024-04-04 NOTE — Progress Notes (Signed)
 Advanced [facility]pertension Clinic Assessment:    Date:  04/04/2024   ID:  Brandy Love, DOB July 30, 1955, MRN [id number]  PCP:  Arloa Elsie SAUNDERS, MD  Cardiologist:  Annabella Scarce, MD  Nephrologist:  Referring MD: Arloa Elsie SAUNDERS, MD   CC: Hypertension  History of Present Illness:    Brandy Love is a 69 y.o. female with a hx of PAF, CVA (04/2022), HTN, asthma, gastric ulcer, GERD here to follow up  in the [facility].   Presented to the ED 01/27/2022 with palpitations diagnosed with atrial fibrillation started on metoprolol  and OAC.  Subsequent monitor showed 2% atrial fibrillation burden and 1.8% SV ectopy.  Has since followed with atrial fibrillation clinic and been started on Multaq .  04/16/22 presented to ED with thalamic stroke started on aspirin  in conjunction to Xarelto  and statin.  Established with [facility] 11/07/22 with BP uncontrolled on multiple agents. Initially diagnosed with hypertension in 2011. At her visit, Losartan  and hydrochlorothiazide  were stopped and instead started on Valsartan /hydrochlorothiazide  320/25mg  daily as well as Spironolactone  25mg  daily. Renal duplex 12/12/22 with no stenosis. Renin-aldosterone not consistent with hyperaldosteronism.   She was last seen 02/02/2023 with home systolic BP 109-113 with occasional lightheadedness with position changes.  Spironolactone  was discontinued.  She was recommended to continue valsartan -HCTZ 320-25 mg daily.  ED visit 02/14/2023 with GI bleeding due to hemorrhoids.  She saw general surgery and was advised for medication to help manage constipation/diarrhea which were felt to be exacerbating her hemorrhoidal bleeding.  At visit 03/2023 BP average at home 135/70. Due to prior lightheadedness with relative hypotension, present regimen continued and lifestyle changes encouraged. Phone visit 07/2023 and given preop clearance for knee surgery which was performed 10/05/23.   She was last seen  12/21/2023.  She was having intermittent palpitations and encouraged to consider Kardia mobile or ZIO if they increased her frequency.  Her blood pressure was at goal with occasional lightheadedness.  She politely declined to make medication changes but was noted if she persisted to be lightheaded her dose of hydrochlorothiazide  could be reduced.  Due to swelling after knee surgery she was given furosemide  20 mg daily for 2 to 3 days.  Presents today for for follow up independently. Her right knee is healing well after surgery and swelling is resolved. She feels her mobility is much improved. She is walking for exercise. She has lost 30 lbs since her last clinic visit. Reports no shortness of breath nor dyspnea on exertion. Reports no chest pain, pressure, or tightness. No edema, orthopnea, PND. Reports more frequent palpitations.  She does note persistent lightheadedness but no syncope.   Previous antihypertensives: Amlodipine - hives, swelling   Past Medical History:  Diagnosis Date   Anemia    Anxiety    Aortic atherosclerosis    Asthma    Bladder prolapse, female, acquired    Cerebral infarction, unspecified (HCC) 04/14/2022   Cerebral microvascular disease    Complex tear of medial meniscus of right knee 10/05/2023   Depression    Diastolic dysfunction    Edema of both lower extremities    Gastric ulcer    GERD (gastroesophageal reflux disease)    Glaucoma    Headache    Hepatic steatosis    Hiatal hernia    HTN (hypertension)    OAB (overactive bladder)    a.) has interstim implant   On dronedarone  therapy    On rivaroxaban  therapy    Ophthalmoplegic migraine  PAF (paroxysmal atrial fibrillation) (HCC)    a.) CHA2DS2VASc = 6 (age, sex, HTN, CVA/TIA x 2,vascular disease) as of 09/29/2023; b.) rate/rhythm maintained on oral dronaderone + metoprolol  succinate; chronically anticoagulated with rivaroxaban    Pneumonia    PONV (postoperative nausea and vomiting)    Pre-diabetes     Primary osteoarthritis of right knee    PSVT (paroxysmal supraventricular tachycardia)    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   TIA (transient ischemic attack)    a.) x 8 as of 09/29/2023 per patient report    Past Surgical History:  Procedure Laterality Date   ABDOMINAL HYSTERECTOMY  1993   ANTERIOR AND POSTERIOR REPAIR N/A 06/04/2019   Procedure: CYSTOSCOPY ANTERIOR (CYSTOCELE);  Surgeon: Gaston Hamilton, MD;  Location: [facility];  Service: Urology;  Laterality: N/A;   APPENDECTOMY     CATARACT EXTRACTION  2016   COLONOSCOPY WITH PROPOFOL  N/A 08/16/2023   Procedure: COLONOSCOPY WITH PROPOFOL ;  Surgeon: Therisa Bi, MD;  Location: [facility] ENDOSCOPY;  Service: Gastroenterology;  Laterality: N/A;   cyst opened and drained on ovary     CYSTECTOMY     1994 and 1982   ESOPHAGOGASTRODUODENOSCOPY     EYE SURGERY     INTERSTIM IMPLANT PLACEMENT  04/10/2012   Procedure: RENNA IMPLANT FIRST STAGE;  Surgeon: Hamilton DELENA Gaston, MD;  [facility];  Service: Urology;  Laterality: N/A;  rad tech ok per vicki at main    INTERSTIM IMPLANT PLACEMENT  04/10/2012   Procedure: INTERSTIM IMPLANT SECOND STAGE;  Surgeon: Hamilton DELENA Gaston, MD;  Location: [facility];  Service: Urology;  Laterality: N/A;   LOWER LEG SOFT TISSUE TUMOR EXCISION  1994   cyst left ankle -involved muscle removed-limited mobility now   NASAL SEPTUM SURGERY     POLYPECTOMY  08/16/2023   Procedure: POLYPECTOMY;  Surgeon: Therisa Bi, MD;  Location: [facility];  Service: Gastroenterology;;   ANDREA SLING N/A 06/04/2019   Procedure: CARLOYN GLADE;  Surgeon: Gaston Hamilton, MD;  Location: [facility];  Service: Urology;  Laterality: N/A;   TOTAL KNEE ARTHROPLASTY Right 10/05/2023   Procedure: ARTHROPLASTY, KNEE, TOTAL;  Surgeon: Edie Norleen PARAS, MD;  Location: [facility];  Service:  Orthopedics;  Laterality: Right;    Current Medications: Current Meds  Medication Sig   acetaminophen  (TYLENOL ) 650 MG CR tablet Take 1,300 mg by mouth in the morning and at bedtime.   albuterol  (PROVENTIL  HFA;VENTOLIN  HFA) 108 (90 Base) MCG/ACT inhaler Inhale 2 puffs into the lungs every 6 (six) hours as needed for wheezing or shortness of breath.   atorvastatin  (LIPITOR) 40 MG tablet Take 1 tablet (40 mg total) by mouth daily.   clotrimazole-betamethasone  (LOTRISONE) cream Apply 1 Application topically 2 (two) times daily as needed (for irritation).   diclofenac Sodium (VOLTAREN) 1 % GEL Apply 2 g topically daily as needed (pain).   dronedarone  (MULTAQ ) 400 MG tablet Take 1 tablet (400 mg total) by mouth 2 (two) times daily with a meal.   gabapentin  (NEURONTIN ) 300 MG capsule Take 300-900 mg by mouth See admin instructions. Take 300 mg by mouth in the morning and at noon, and 900 mg at night   hyoscyamine  (LEVSIN  SL) 0.125 MG SL tablet Place 0.125 mg under the tongue 2 (two) times daily as needed for cramping.   meclizine (ANTIVERT) 12.5 MG tablet Take 12.5 mg by mouth 2 (two) times daily.  metoprolol  succinate (TOPROL -XL) 25 MG 24 hr tablet TAKE 1/2 TABLET (12.5 MG TOTAL) BY MOUTH AT BEDTIME.   omeprazole (PRILOSEC) 40 MG capsule Take 40 mg by mouth daily before breakfast.   sertraline  (ZOLOFT ) 100 MG tablet Take 200 mg by mouth daily.   sucralfate  (CARAFATE ) 1 g tablet Take 1 g by mouth daily as needed (to coat the stomach).   triamcinolone  (KENALOG ) 0.1 % paste 1 Application 2 (two) times daily as needed (for fever blisters- apply to the lips).   valsartan -hydrochlorothiazide  (DIOVAN -HCT) 320-12.5 MG tablet Take 1 tablet by mouth daily.   XARELTO  20 MG TABS tablet TAKE 1 TABLET (20 MG TOTAL) BY MOUTH DAILY WITH SUPPER.   [DISCONTINUED] furosemide  (LASIX ) 20 MG tablet Use one tablet daily for 2-3 days to help with leg swelling. (Patient taking differently: Take 20 mg by mouth daily as  needed (swelling). Use one tablet daily for 2-3 days to help with leg swelling.)   [DISCONTINUED] oxyCODONE  (ROXICODONE ) 5 MG immediate release tablet Take 1-2 tablets (5-10 mg total) by mouth every 4 (four) hours as needed for moderate pain (pain score 4-6) or severe pain (pain score 7-10).   [DISCONTINUED] valsartan -hydrochlorothiazide  (DIOVAN -HCT) 320-25 MG tablet TAKE 1 TABLET BY MOUTH DAILY. STOP HYDROCHLOROTHIAZIDE  AND LOSARTAN      Allergies:   Shellfish allergy, Sulfa antibiotics, Erythromycin, Ibuprofen, Iodine, Adhesive [tape], Amlodipine, Betadine [povidone iodine], Povidone-iodine, Silicone, and Spironolactone    Social History   Socioeconomic History   Marital status: Married    Spouse name: Chief Operating Officer   Number of children: 3   Years of education: Not on file   Highest education level: Not on file  Occupational History   Occupation: Engineer, building services    Occupation: Retired from [employer]  Tobacco Use   Smoking status: Never    Passive exposure: Never   Smokeless tobacco: Never  Vaping Use   Vaping status: Never Used  Substance and Sexual Activity   Alcohol use: No   Drug use: No   Sexual activity: Yes  Other Topics Concern   Not on file  Social History Narrative   Not on file   Social Drivers of Health   Financial Resource Strain: Not on file  Food Insecurity: No Food Insecurity (10/05/2023)   Hunger Vital Sign    Worried About Running Out of Food in the Last Year: Never true    Ran Out of Food in the Last Year: Never true  Transportation Needs: No Transportation Needs (10/05/2023)   PRAPARE - Administrator, Civil Service (Medical): No    Lack of Transportation (Non-Medical): No  Physical Activity: Inactive (11/07/2022)   Exercise Vital Sign    Days of Exercise per Week: 0 days    Minutes of Exercise per Session: 0 min  Stress: Not on file  Social Connections: Moderately Isolated (10/05/2023)   Social Connection and Isolation Panel     Frequency of Communication with Friends and Family: More than three times a week    Frequency of Social Gatherings with Friends and Family: Twice a week    Attends Religious Services: Never    Database administrator or Organizations: No    Attends Engineer, structural: Never    Marital Status: Married     Family History: The patient's family history includes Asthma in her mother; Atrial fibrillation in her brother; Diabetes in her mother; Heart disease in her father; Hernia in her mother; Obesity in her mother; Prostate cancer in her father.  ROS:   Please see the history of present illness.    All other systems reviewed and are negative.  EKGs/Labs/Other Studies Reviewed:         Recent Labs: 10/16/2023: ALT 13; BUN 17; Creatinine, Ser 0.98; Hemoglobin 11.0; Platelets 247; Potassium 3.7; Sodium 137   Recent Lipid Panel    Component Value Date/Time   CHOL 181 04/14/2022 2218   CHOL 189 07/29/2019 1455   TRIG 65 04/14/2022 2218   HDL 49 04/14/2022 2218   HDL 43 07/29/2019 1455   CHOLHDL 3.7 04/14/2022 2218   VLDL 13 04/14/2022 2218   LDLCALC 119 (H) 04/14/2022 2218   LDLCALC 125 (H) 07/29/2019 1455    Physical Exam:   VS:  BP 106/66   Pulse 73   Ht 5' 7 (1.702 m)   Wt 207 lb 9.6 oz (94.2 kg)   SpO2 96%   BMI 32.51 kg/m  , BMI Body mass index is 32.51 kg/m. GENERAL:  Well appearing HEENT: Pupils equal round and reactive, fundi not visualized, oral mucosa unremarkable NECK:  No jugular venous distention, waveform within normal limits, carotid upstroke brisk and symmetric, no bruits, no thyromegaly LYMPHATICS:  No cervical adenopathy LUNGS:  Clear to auscultation bilaterally HEART:  RRR.  PMI not displaced or sustained,S1 and S2 within normal limits, no S3, no S4, no clicks, no rubs, no murmurs ABD:  Flat, positive bowel sounds normal in frequency in pitch, no bruits, no rebound, no guarding, no midline pulsatile mass, no hepatomegaly, no splenomegaly EXT:  2  plus pulses throughout, nonpitting RLE edema, no cyanosis no clubbing SKIN:  No rashes no nodules NEURO:  Cranial nerves II through XII grossly intact, motor grossly intact throughout PSYCH:  Cognitively intact, oriented to person place and time   ASSESSMENT/PLAN:        Atrial Fibrillation / Hypercoagulable state Increased palpitations with history of AFib. - Plan for Zio monitor and follow up with AFib clinic in ~6 weeks. - Continue Multaq  400 mg twice daily, Xarelto  20 mg daily.  Denies bleeding complications. -CHA2DS2-VASc Score =   [ ] .  Therefore, the patient's annual risk of stroke is   %.      Hypertension Now with symptomatic hypotension.  Will reduce her valsartan -HCTZ from 320-25mg  to 320-12.5mg  daily.  -Discussed to monitor BP at home at least 2 hours after medications and sitting for 5-10 minutes.  - If persistently hypotensive at follow-up visit with PCP next month could consider discontinuation of HCTZ completely.  -Discussed to monitor BP at home at least 2 hours after medications and sitting for 5-10 minutes.   History of CVA / HLD, LDL goal <55  Continue Atorvastatin  40 mg daily.  LDL goal less than 55 given history of stroke, age, hypertension.Upcoming visit with primary care in October, anticipate lab recheck at that time.    Screening for Secondary Hypertension:     11/07/2022    2:55 PM  Causes  Drugs/Herbals Screened     - Comments limits salt.  no caffeine/EtOH.  Renovascular HTN Screened  Sleep Apnea Screened  Thyroid  Disease Screened  Hyperaldosteronism Screened     - Comments check renin/aldosterone  Pheochromocytoma N/A  Cushing's Syndrome N/A  Hyperparathyroidism Screened  Coarctation of the Aorta Screened     - Comments BP symmetric  Compliance Screened    Relevant Labs/Studies:    Latest Ref Rng & Units 10/16/2023    9:50 AM 09/27/2023   11:55 AM 02/14/2023    3:15 PM  Basic Labs  Sodium 135 - 145 mmol/L 137  142  140   Potassium 3.5 - 5.1  mmol/L 3.7  4.0  3.6   Creatinine 0.44 - 1.00 mg/dL 9.01  8.87  8.93        Latest Ref Rng & Units 07/29/2019    2:55 PM 10/17/2017    9:38 AM  Thyroid    TSH 0.450 - 4.500 uIU/mL 2.350  2.920        Latest Ref Rng & Units 11/17/2022   11:04 AM  Renin/Aldosterone   Aldosterone 0.0 - 30.0 ng/dL 87.9   Aldos/Renin Ratio 0.0 - 30.0 0.5              12/12/2022   10:54 AM  Renovascular   Renal Artery US  Completed Yes     Disposition:    follow up with A-fib clinic in about 6 weeks FU with MD/PharmD in 6 months    Medication Adjustments/Labs and Tests Ordered: Current medicines are reviewed at length with the patient today.  Concerns regarding medicines are outlined above.  Orders Placed This Encounter  Procedures   LONG TERM MONITOR (3-14 DAYS)   Meds ordered this encounter  Medications   valsartan -hydrochlorothiazide  (DIOVAN -HCT) 320-12.5 MG tablet    Sig: Take 1 tablet by mouth daily.    Dispense:  30 tablet    Refill:  5    STOP Valsartan -hydrochlorothiazide  320-25mg  daily    Supervising Provider:   LONNI SLAIN [[id number]]     Signed, Reche GORMAN Finder, NP  04/04/2024 2:32 PM    [facility]

## 2024-04-04 NOTE — Progress Notes (Unsigned)
Enrolled patient for a 14 day Zio XT monitor to be mailed to patients home  [place] to read

## 2024-04-04 NOTE — Patient Instructions (Addendum)
 Instructions:  CHANGE Valsartan -hydrochlorothiazide  to 320-12.5mg  daily  IF your blood pressure is consistently low and/or your lightheadedness continues, let Dr. Arloa know and could consider stopping the hydrochlorothiazide      Testing/Procedures: Your physician has recommended that you wear a Zio monitor.   This monitor is a medical device that records the heart's electrical activity. Doctors most often use these monitors to diagnose arrhythmias. Arrhythmias are problems with the speed or rhythm of the heartbeat. The monitor is a small device applied to your chest. You can wear one while you do your normal daily activities. While wearing this monitor if you have any symptoms to push the button and record what you felt. Once you have worn this monitor for the period of time provider prescribed (Usually 14 days), you will return the monitor device in the postage paid box. Once it is returned they will download the data collected and provide us  with a report which the provider will then review and we will call you with those results. Important tips:  Avoid showering during the first 24 hours of wearing the monitor. Avoid excessive sweating to help maximize wear time. Do not submerge the device, no hot tubs, and no swimming pools. Keep any lotions or oils away from the patch. After 24 hours you may shower with the patch on. Take brief showers with your back facing the shower head.  Do not remove patch once it has been placed because that will interrupt data and decrease adhesive wear time. Push the button when you have any symptoms and write down what you were feeling. Once you have completed wearing your monitor, remove and place into box which has postage paid and place in your outgoing mailbox.  If for some reason you have misplaced your box then call our office and we can provide another box and/or mail it off for you.  Follow-Up: Follow up in 6-8 weeks with AFib Clinic Please  follow up in 6 months in [facility] with Dr. Raford, Reche Finder, NP or Kristin Alvstad PharmD

## 2024-05-14 NOTE — Progress Notes (Signed)
 Today the history is gathered from: 100% - patient  0% - alone today  RECORDS SUMMARY: Patient is here to be evaluated for a stroke that she had in 04/16/2022.   REFERRING PHYSICIAN: Pcp PRIMARY CARE PHYSICIAN:  [facility]  IMPRESSION/PLAN  Brandy Love is a 68 y.o. female presenting for evaluation of  STROKE/ NUMBNESS/ TINGLING/ MEMORY LOSS/ HX OF TBI/  - Stable.  - Patient reports no new stroke or stroke-like symptoms. The last episode of stroke-like symptoms occurred in October 2023. Reports ongoing numbness and tingling in the left arm radiating to the left hand. Sleep is poor, sleep durations varies nightly. Endorses increased stress secondary to unemployment. Taking Gabapentin  300 mg six times a day- states it is ineffective.  - Increase Gabapentin  to 400 mg six times a day for numbness, tingling and pain in left hand. - Recommend wearing Elbow Brace given numbness, tingling, and pain in left arm. To put on- palm must be faced upwards.  - Encouraged patient to stay physically active and exercise on regular basis (90 mins per week or 15 mins per day). Exercise can be very beneficial in many neurological conditions.  - Will continue to monitor brain fog.   Medications previously tried:  Follow-up with Dr. Lane in 3 months  p=4  CHIEF COMPLAINT & HPI  Brandy Love is a 68 y.o. female presenting for evaluation of: Chief Complaint  Patient presents with  . STROKE/ NUMBNESS/ TINGLING/ MEMORY LOSS/ HX OF TBI    STROKE/ NUMBNESS/ TINGLING/ MEMORY LOSS/ HX OF TBI/  Patient reports no new stroke or stroke-like symptoms. The last episode of stroke-like symptoms occurred in October 2023. She denies facial droop, slurred speech, and one sided weakness. Reports ongoing numbness and tingling in the left arm radiating to the left hand, primarily affecting digits 4-5, along with intermittent left hand pain and cramping. Patient notes some difficulty with grip strength but  denies any difficulty performing activities of daily living (ADLs). No symptoms are reported in the right upper or lower extremities. Patient reports no new memory concerns, describing the issue instead as brain fog. Sleep is poor, sleep durations varies nightly. Endorses increased stress secondary to unemployment. Taking Gabapentin  300 mg six times a day- states it is ineffective.   DATA SUMMARY: 04/16/2022 MR BRAIN WO CONTRAST IMPRESSION:  1. Acute nonhemorrhagic infarct of the right thalamus is better  defined on today's study. This likely represents expected evolution  of the infarct rather than any significant progression  2. Remote lacunar infarct of the left thalamus is stable.  3. Scattered white matter disease is otherwise stable. This likely  reflects the sequela of chronic microvascular ischemia.   04/16/2022 CT HEAD WO CONTRAST IMPRESSION:  1. Evolving right thalamic lacunar infarct with no associated  hemorrhage or mass effect.  2. Otherwise stable non contrast CT appearance of the brain.   04/15/2022 CT ANGIO HEAD NECK W WO CM  IMPRESSION:  1. The small right thalamic infarcts seen on the prior brain MRI is  not well seen on the current study. No evidence of new acute  intracranial pathology.  2. Patent vasculature of the head and neck with no hemodynamically  significant stenosis or occlusion.   04/14/2022 MR BRAIN WO CONTRAST IMPRESSION:  1. 6 mm nonhemorrhagic acute/subacute infarct of the lateral right  thalamus.  2. Scattered subcortical T2 hyperintensities bilaterally are mildly  advanced for age. The finding is nonspecific but can be seen in the  setting of chronic microvascular  ischemia, a demyelinating process  such as multiple sclerosis, vasculitis, complicated migraine  headaches, or as the sequelae of a prior infectious or inflammatory  process.   04/13/2022 CT HEAD WO CONTRAST IMPRESSION:  CT of the head: No acute intracranial abnormality  noted.   04/13/2022 CT CERVICAL SPINE WO CONTRAST IMPRESSION: CT of the cervical spine: Multilevel degenerative change without  acute abnormality.   08/27/2021 CT HEAD WO CONTRAST IMPRESSION:  Normal head CT. No abnormality seen to explain the presenting  symptoms. There is some atherosclerotic calcification of the major  vessels at the base of the brain, usually seen at this age.    VISIT SUMMARIES:   MEDICATIONS Current Outpatient Medications  Medication Sig Dispense Refill  . acetaminophen  (TYLENOL ) 650 MG ER tablet Take 1,300 mg by mouth 2 (two) times daily    . albuterol  90 mcg/actuation inhaler Inhale into the lungs    . atorvastatin  (LIPITOR) 40 MG tablet Take 40 mg by mouth once daily    . benzonatate (TESSALON) 200 MG capsule 3 (three) times daily as needed    . clotrimazole-betamethasone  (LOTRISONE) 1-0.05 % cream Apply topically    . dronedarone  (MULTAQ ) 400 mg tablet Take 400 mg by mouth 2 (two) times daily with meals    . gabapentin  (NEURONTIN ) 300 MG capsule Take 1 capsule (300 mg total) by mouth 6 (six) times daily 180 capsule 1  . hyoscyamine  (LEVSIN /SL) 0.125 mg SL tablet Take 0.125 mg by mouth every 6 (six) hours as needed    . lipase-protease-amylase (CREON) 36,000-114,000-180,000 unit DR capsule 2 caps before each meal and 1 before each snack 720 capsule 3  . meclizine (ANTIVERT) 12.5 mg tablet 1 tablet as needed Orally every 12 hrs for vertigo or nausea for 30 days    . metoprolol  succinate (TOPROL -XL) 25 MG XL tablet Take 12.5 mg by mouth once daily    . MULTAQ  400 mg tablet Take 400 mg by mouth 2 (two) times daily with meals    . omeprazole (PRILOSEC) 40 MG DR capsule Take 40 mg by mouth once daily    . rivaroxaban  (XARELTO ) 20 mg tablet Take by mouth daily with breakfast    . sertraline  (ZOLOFT ) 100 MG tablet Take 100 mg by mouth 2 (two) times daily    . sucralfate  (CARAFATE ) 1 gram tablet Take 1 g by mouth once daily as needed    . triamcinolone  acetonide  (ORALONE ) 0.1 % paste Apply topically 2 (two) times daily    . valsartan -hydroCHLOROthiazide  (DIOVAN -HCT) 320-25 mg tablet Take 1 tablet by mouth once daily    . chlorthalidone  15 MG tablet Take 15 mg by mouth 3 (three) times daily as needed (Patient not taking: Reported on 12/13/2023)    . dexlansoprazole  (DEXILANT ) 30 mg DR capsule Dexilant  (Patient not taking: Reported on 12/13/2023)    . lipase-protease-amylase (CREON) 36,000-114,000-180,000 unit DR capsule Take 8 capsules by mouth 3 (three) times daily with meals Take 2 capsules before each meal and take 1 capsule before each snack (Patient not taking: Reported on 05/14/2024) 720 capsule 3  . lipase-protease-amylase, pork, (CREON) 36,000-114,000-180,000 unit DR capsule Take 2 capsules before each meal ( 3 times a day) and take one capsule before each snack ( 2 times a day) (Patient not taking: Reported on 05/14/2024) 720 capsule 3  . losartan  (COZAAR ) 25 MG tablet Losartan  Potassium (Patient not taking: Reported on 12/13/2023)    . naproxen sodium (ALEVE ORAL) Aleve (Patient not taking: Reported on 12/13/2023)    .  omega-3s/dha/epa/fish oil/D3 (VITAMIN-D + OMEGA-3 ORAL) VIT D (Patient not taking: Reported on 12/13/2023)    . oxyCODONE  (ROXICODONE ) 5 MG immediate release tablet Take 1 tablet (5 mg total) by mouth every 8 (eight) hours as needed (Patient not taking: Reported on 05/14/2024) 21 tablet 0  . sertraline  (ZOLOFT ) 25 MG tablet Sertraline  HCl (Patient not taking: Reported on 12/13/2023)    . tolterodine (DETROL LA) 4 MG LA capsule Tolterodine Tartrate (Patient not taking: Reported on 12/13/2023)     No current facility-administered medications for this visit.    ALLERGIES Allergies  Allergen Reactions  . Shellfish Containing Products Unknown, Anaphylaxis, Nausea, Other (See Comments) and Swelling    Swelling of the throat  . Sulfa (Sulfonamide Antibiotics) Hives  . Erythromycin Unknown and Hives  . Ibuprofen Rash and Other (See Comments)     REACTION: vertigo  Vertigo  . Iodine Unknown  . Other Nausea and Swelling    Swelling of throat  . Adhesive Tape-Silicones Unknown, Other (See Comments) and Rash    Rash with transderm  . Amlodipine Rash, Hives and Swelling  . Povidone-Iodine Rash and Other (See Comments)    ALL TOPICAL IODINES  . Silicone Other (See Comments) and Rash    Rash with transderm  . Spironolactone  Rash     EXAM   Vitals:   05/14/24 0921  Weight: 93.4 kg (206 lb)  Height: 170.2 cm (5' 7)  PainSc:   3  PainLoc: Hand    Body mass index is 32.26 kg/m.  MEMORY EVALUATION: 12/13/2023 - 29/30 03/22/2023 - 30/30  GENERAL: Very pleasant female, in no acute distress. Normocephalic and atraumatic.  Patient has a 2 cm abrasion on her forehead following fall on 05/12/2023.  MUSCULOSKELETAL: Bulk - Normal Tone - Normal Pronator Drift - Absent bilaterally. Ambulation - Gait and station is did not test.  Romberg - did not test.   R/L 5/5    Shoulder abduction (deltoid/supraspinatus, axillary/suprascapular n, C5) 5/5    Elbow flexion (biceps brachii, musculoskeletal n, C5-6) 5/5    Elbow extension (triceps, radial n, C7) 5/5    Finger adduction (interossei, ulnar n, T1)  5/5    Hip flexion (iliopsoas, L1/L2) 5/5    Knee flexion (hamstrings, sciatic n, L5/S1)  5/5    Knee extension (quadriceps, femoral n, L3/4) 5/5    Ankle dorsiflexion (tibialis anterior, deep fibular n, L4/5) 5/5    Ankle plantarflexion (gastroc, tibial n, S1)   NEUROLOGICAL: MENTAL STATUS: Patient is oriented to person, place and time.   Short-term memory is intact.  Long-term memory is intact.   Attention span and concentration are intact.   Naming and repetition are intact. Comprehension is intact.   Expressive speech is intact.   Patient's fund of knowledge is within normal limits for educational level.  CRANIAL NERVES: Visual acuity and visual fields are intact         Extraocular muscles are intact                         Facial sensation is intact bilaterally                Facial strength is intact bilaterally                   Hearing is intact bilaterally  Palate elevates midline, normal phonation     Shoulder shrug strength is intact                    Tongue protrudes midline                       SENSATION: Pain and temperature (spinothalamic tracts) decreased sensation in the right arm.  Position and vibration (dorsal columns) is normal.  REFLEXES: R/L 2+/2+    Biceps 2+/2+    Brachioradialis  2+/2+    Patellar 2+/2+    Achilles  COORDINATION/CEREBELLAR: Finger to nose testing is did not test.       PAST MEDICAL HISTORY Past Medical History:  Diagnosis Date  . Asthma, unspecified asthma severity, unspecified whether complicated, unspecified whether persistent (HHS-HCC)   . Stroke (cerebrum) (CMS/HHS-HCC)     PAST SURGICAL HISTORY Past Surgical History:  Procedure Laterality Date  . ARTHROPLASTY TOTAL KNEE Right 10/05/2023   Dr. Edie  . APPENDECTOMY      FAMILY HISTORY Family History  Problem Relation Name Age of Onset  . Asthma Mother    . Prostate cancer Father      SOCIAL HISTORY  Social History   Tobacco Use  . Smoking status: Never  . Smokeless tobacco: Never  Vaping Use  . Vaping status: Never Used  Substance Use Topics  . Alcohol use: Not Currently  . Drug use: Never     REVIEW OF SYSTEMS:  13 system ROS form was given to the patient to complete and I have reviewed it.  The form was sent for scan to the patient's EHR.  Pertinent positives and negatives are mentioned above in the HPI and all other systems are negative.   DATA  I have personally reviewed all of the data outlined below both prior to the appointment and during the appointment with the patient as appropriate.  No visits with results within 6 Month(s) from this visit.  Latest known visit with results is:  No results found for any previous visit.       No follow-ups on file.  Payor: [facility] MEDICARE ADVANTAGE PLAN / Plan: [facility] MEDICARE ADVANTAGE / Product Type: Medicare /  This note is partially written by Lauraine Hales, in the presence of and acting as the scribe of Dr. Arthea Farrow.     I have reviewed, edited and added to the note as needed to reflect my best personal medical judgment.    Dr. Arthea Farrow, MD [facility] [place], [place] Ph:  [phone] Fax:  [phone]

## 2024-05-15 DIAGNOSIS — I480 Paroxysmal atrial fibrillation: Secondary | ICD-10-CM

## 2024-05-15 DIAGNOSIS — D6859 Other primary thrombophilia: Secondary | ICD-10-CM | POA: Diagnosis not present

## 2024-05-15 DIAGNOSIS — Z8673 Personal history of transient ischemic attack (TIA), and cerebral infarction without residual deficits: Secondary | ICD-10-CM | POA: Diagnosis not present

## 2024-05-15 DIAGNOSIS — I10 Essential (primary) hypertension: Secondary | ICD-10-CM | POA: Diagnosis not present

## 2024-05-16 ENCOUNTER — Ambulatory Visit (HOSPITAL_COMMUNITY)
Admission: RE | Admit: 2024-05-16 | Discharge: 2024-05-16 | Disposition: A | Discharge status: Home / Self Care | Source: Ambulatory Visit | Attending: Physician Assistant | Admitting: Physician Assistant

## 2024-05-16 VITALS — BP 110/60 | HR 64 | Ht 67.0 in | Wt 206.0 lb

## 2024-05-16 DIAGNOSIS — I480 Paroxysmal atrial fibrillation: Secondary | ICD-10-CM

## 2024-05-16 DIAGNOSIS — Z79899 Other long term (current) drug therapy: Secondary | ICD-10-CM

## 2024-05-16 DIAGNOSIS — I4891 Unspecified atrial fibrillation: Secondary | ICD-10-CM | POA: Diagnosis not present

## 2024-05-16 DIAGNOSIS — D6869 Other thrombophilia: Secondary | ICD-10-CM

## 2024-05-16 DIAGNOSIS — Z5181 Encounter for therapeutic drug level monitoring: Secondary | ICD-10-CM

## 2024-05-16 NOTE — Patient Instructions (Signed)
 Stop metoprolol

## 2024-05-16 NOTE — Progress Notes (Addendum)
 Primary Care Physician: Arloa Elsie SAUNDERS, MD Primary Cardiologist: Annabella Scarce, MD Electrophysiologist: None  Referring Physician: ED   Brandy Love is a 68 y.o. female with a history of CVA, HTN, asthma, gastric ulcer, atrial fibrillation who presents for follow up in the [facility]. Presented to the ED 01/27/2022 with palpitations diagnosed with atrial fibrillation started on metoprolol  and OAC. Subsequent monitor showed 2% atrial fibrillation burden. She has been maintained on Multaq . 04/16/22 presented to ED with thalamic stroke started on aspirin  in conjunction to Xarelto  and statin.   She was seen by Reche Finder for HTN follow up and patient reported palpitations. A cardiac monitor was ordered which showed no afib, rare <1% ectopy.   Patient presents today for follow up for atrial fibrillation and Multaq  monitoring. She is in SR today and feels well. Her palpitations are rare and very brief. She will occasionally have a small amount of blood in toilet after a bowel movement, evaluated by GI, related to hemorrhoids.   Today, she denies symptoms of chest pain, shortness of breath, orthopnea, PND, lower extremity edema, dizziness, presyncope, syncope, snoring, daytime somnolence, or neurologic sequela. The patient is tolerating medications without difficulties and is otherwise without complaint today.    Atrial Fibrillation Risk Factors:  she does not have symptoms or diagnosis of sleep apnea. she does not have a history of rheumatic fever.   Atrial Fibrillation Management history:  Previous antiarrhythmic drugs: Multaq   Previous cardioversions: none Previous ablations: none Anticoagulation history: Xarelto   ROS- All systems are reviewed and negative except as per the HPI above.  Past Medical History:  Diagnosis Date   Anemia    Anxiety    Aortic atherosclerosis    Asthma    Bladder prolapse, female, acquired    Cerebral infarction,  unspecified (HCC) 04/14/2022   Cerebral microvascular disease    Complex tear of medial meniscus of right knee 10/05/2023   Depression    Diastolic dysfunction    Edema of both lower extremities    Gastric ulcer    GERD (gastroesophageal reflux disease)    Glaucoma    Headache    Hepatic steatosis    Hiatal hernia    HTN (hypertension)    OAB (overactive bladder)    a.) has interstim implant   On dronedarone  therapy    On rivaroxaban  therapy    Ophthalmoplegic migraine    PAF (paroxysmal atrial fibrillation) (HCC)    a.) CHA2DS2VASc = 6 (age, sex, HTN, CVA/TIA x 2,vascular disease) as of 09/29/2023; b.) rate/rhythm maintained on oral dronaderone + metoprolol  succinate; chronically anticoagulated with rivaroxaban    Pneumonia    PONV (postoperative nausea and vomiting)    Pre-diabetes    Primary osteoarthritis of right knee    PSVT (paroxysmal supraventricular tachycardia)    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   TIA (transient ischemic attack)    a.) x 8 as of 09/29/2023 per patient report    Current Outpatient Medications  Medication Sig Dispense Refill   acetaminophen  (TYLENOL ) 650 MG CR tablet Take 1,300 mg by mouth in the morning and at bedtime. (Patient taking differently: Take 1,300 mg by mouth 3 (three) times daily.)     albuterol  (PROVENTIL  HFA;VENTOLIN  HFA) 108 (90 Base) MCG/ACT inhaler Inhale 2 puffs into the lungs every 6 (six) hours as needed for wheezing or shortness of breath.     atorvastatin  (  LIPITOR) 40 MG tablet Take 1 tablet (40 mg total) by mouth daily. 90 tablet 3   clotrimazole-betamethasone  (LOTRISONE) cream Apply 1 Application topically 2 (two) times daily as needed (for irritation). (Patient taking differently: Apply 1 Application topically as needed (for irritation).)     dronedarone  (MULTAQ ) 400 MG tablet Take 1 tablet (400 mg total) by mouth 2 (two) times  daily with a meal. 60 tablet 5   gabapentin  (NEURONTIN ) 400 MG capsule Take 400 mg by mouth 6 (six) times daily.     hyoscyamine  (LEVSIN  SL) 0.125 MG SL tablet Place 0.125 mg under the tongue 2 (two) times daily as needed for cramping. (Patient taking differently: Place 0.125 mg under the tongue as needed for cramping.)     lipase/protease/amylase (CREON) 36000 UNITS CPEP capsule Take 2 capsules before each meal ( 3 times a day) and take one capsule before each snack ( 2 times a day)     meclizine (ANTIVERT) 12.5 MG tablet Take 12.5 mg by mouth 2 (two) times daily. (Patient taking differently: Take 12.5 mg by mouth as needed.)     omeprazole (PRILOSEC) 40 MG capsule Take 40 mg by mouth daily before breakfast.     sertraline  (ZOLOFT ) 100 MG tablet Take 200 mg by mouth daily.     sucralfate  (CARAFATE ) 1 g tablet Take 1 g by mouth daily as needed (to coat the stomach). (Patient taking differently: Take 1 g by mouth as needed (to coat the stomach).)     triamcinolone  (KENALOG ) 0.1 % paste 1 Application 2 (two) times daily as needed (for fever blisters- apply to the lips).     valsartan -hydrochlorothiazide  (DIOVAN -HCT) 320-12.5 MG tablet Take 1 tablet by mouth daily. 30 tablet 5   XARELTO  20 MG TABS tablet TAKE 1 TABLET (20 MG TOTAL) BY MOUTH DAILY WITH SUPPER. 30 tablet 5   No current facility-administered medications for this encounter.    Physical Exam: BP 110/60   Pulse 64   Ht 5' 7 (1.702 m)   Wt 93.4 kg   BMI 32.26 kg/m   GEN: Well nourished, well developed in no acute distress CARDIAC: Regular rate and rhythm, no murmurs, rubs, gallops RESPIRATORY:  Clear to auscultation without rales, wheezing or rhonchi  ABDOMEN: Soft, non-tender, non-distended EXTREMITIES:  No edema; No deformity   Wt Readings from Last 3 Encounters:  05/16/24 93.4 kg  04/04/24 94.2 kg  12/21/23 108.5 kg     EKG today demonstrates  SR Vent. rate 64 BPM PR interval 200 ms QRS duration 88 ms QT/QTcB  412/425 ms  Echo 04/15/22 demonstrated   1. Left ventricular ejection fraction, by estimation, is 55 to 60%. The  left ventricle has normal function. The left ventricle has no regional  wall motion abnormalities. Left ventricular diastolic parameters are  consistent with Grade I diastolic dysfunction (impaired relaxation).   2. Right ventricular systolic function is normal. The right ventricular  size is normal. Tricuspid regurgitation signal is inadequate for assessing  PA pressure.   3. Left atrial size was mildly dilated.   4. The mitral valve is grossly normal. Trivial mitral valve  regurgitation.   5. The aortic valve was not well visualized. Aortic valve regurgitation  is not visualized. Aortic valve sclerosis/calcification is present,  without any evidence of aortic stenosis.   6. The inferior vena cava is normal in size with greater than 50%  respiratory variability, suggesting right atrial pressure of 3 mmHg.   Comparison(s): Compared to prior TTE in 03/02/22,  the MR appears less.  Otherwise, there is no significant change.    CHA2DS2-VASc Score = 5  The patient's score is based upon: CHF History: 0 HTN History: 1 Diabetes History: 0 Stroke History: 2 Vascular Disease History: 0 Age Score: 1 Gender Score: 1       ASSESSMENT AND PLAN: Paroxysmal Atrial Fibrillation (ICD10:  I48.0) The patient's CHA2DS2-VASc score is 5, indicating a 7.2% annual risk of stroke.   Patient appears to be maintaining SR. Recent monitor showed 0% afib burden.  Continue Multaq  400 mg BID Patient has trouble cutting Toprol  in half, will discontinue for now since she is maintaining SR. If she has significant recurrence of her afib, she may be a good candidate for ablation given her significant weight loss. (80 lbs)  Secondary Hypercoagulable State (ICD10:  D68.69) The patient is at significant risk for stroke/thromboembolism based upon her CHA2DS2-VASc Score of 5.  Continue Rivaroxaban   (Xarelto ). No recent bleeding issues.   High Risk Medication Monitoring (ICD 10: J342684) Patient requires ongoing monitoring for anti-arrhythmic medication which has the potential to cause life threatening arrhythmias. Intervals on ECG acceptable for dronedarone  monitoring.      HTN Stable on current regimen Followed in HTN clinic.    Follow up in the AF clinic in 6 months.    [facility] [address] [place], [place] [address] [phone]

## 2024-05-17 ENCOUNTER — Ambulatory Visit (HOSPITAL_BASED_OUTPATIENT_CLINIC_OR_DEPARTMENT_OTHER): Payer: Self-pay | Admitting: Family

## 2024-05-25 ENCOUNTER — Other Ambulatory Visit (HOSPITAL_BASED_OUTPATIENT_CLINIC_OR_DEPARTMENT_OTHER): Payer: Self-pay | Admitting: Family

## 2024-05-25 DIAGNOSIS — E782 Mixed hyperlipidemia: Secondary | ICD-10-CM

## 2024-05-25 DIAGNOSIS — Z8673 Personal history of transient ischemic attack (TIA), and cerebral infarction without residual deficits: Secondary | ICD-10-CM

## 2024-05-30 ENCOUNTER — Other Ambulatory Visit: Payer: Self-pay | Admitting: Urology

## 2024-05-30 ENCOUNTER — Telehealth: Payer: Self-pay | Admitting: Cardiovascular Disease

## 2024-05-30 NOTE — Telephone Encounter (Signed)
   Pre-operative Risk Assessment    Patient Name: Brandy Love  DOB: [birth year] MRN: [id number]   Date of last office visit: 04/04/24 Date of next office visit: Unknown   Request for Surgical Clearance    Procedure:  removal of Interstim  Date of Surgery:  Clearance 07/09/24                                Surgeon:  Dr Glendia Elizabeth  Surgeon's Group or Practice Name:  [facility] Phone number:  [phone] Fax number:  [phone]   Type of Clearance Requested:   - Medical  - Pharmacy:  Hold    Zarelto    Type of Anesthesia:  MAC   Additional requests/questions:    SignedHamilton Bergeron   05/30/2024, 3:59 PM

## 2024-06-04 ENCOUNTER — Telehealth (HOSPITAL_BASED_OUTPATIENT_CLINIC_OR_DEPARTMENT_OTHER): Payer: Self-pay | Admitting: *Deleted

## 2024-06-04 NOTE — Telephone Encounter (Signed)
 S/w the pt and she has been scheduled tele preop appt 06/19/24. Med rec and consent are done.

## 2024-06-04 NOTE — Telephone Encounter (Signed)
   Name: Brandy Love  DOB: [birth year]  MRN: [id number]  Primary Cardiologist: Annabella Scarce, MD   Preoperative team, please contact this patient and set up a phone call appointment for further preoperative risk assessment. Please obtain consent and complete medication review. Thank you for your help.  I confirm that guidance regarding antiplatelet and oral anticoagulation therapy has been completed and, if necessary, noted below.   Per office protocol, patient can hold Xarelto  for 2 days prior to procedure.   Patient will not need bridging with Lovenox (enoxaparin) around procedure.   I also confirmed the patient resides in the state of [place] . As per [facility] Medical Board telemedicine laws, the patient must reside in the state in which the provider is licensed.   Damien JAYSON Braver, NP 06/04/2024, 4:13 PM [facility]

## 2024-06-04 NOTE — Telephone Encounter (Signed)
 S/w the pt and she has been scheduled tele preop appt 06/19/24. Med rec and consent are done.      Patient Consent for Virtual Visit        Brandy Love has provided verbal consent on 06/04/2024 for a virtual visit (video or telephone).   CONSENT FOR VIRTUAL VISIT FOR:  Brandy Love  By participating in this virtual visit I agree to the following:  I hereby voluntarily request, consent and authorize [facility] and its employed or contracted physicians, physician assistants, nurse practitioners or other licensed health care professionals (the Practitioner), to provide me with telemedicine health care services (the "Services) as deemed necessary by the treating Practitioner. I acknowledge and consent to receive the Services by the Practitioner via telemedicine. I understand that the telemedicine visit will involve communicating with the Practitioner through live audiovisual communication technology and the disclosure of certain medical information by electronic transmission. I acknowledge that I have been given the opportunity to request an in-person assessment or other available alternative prior to the telemedicine visit and am voluntarily participating in the telemedicine visit.  I understand that I have the right to withhold or withdraw my consent to the use of telemedicine in the course of my care at any time, without affecting my right to future care or treatment, and that the Practitioner or I may terminate the telemedicine visit at any time. I understand that I have the right to inspect all information obtained and/or recorded in the course of the telemedicine visit and may receive copies of available information for a reasonable fee.  I understand that some of the potential risks of receiving the Services via telemedicine include:  Delay or interruption in medical evaluation due to technological equipment failure or disruption; Information transmitted may not be sufficient  (e.g. poor resolution of images) to allow for appropriate medical decision making by the Practitioner; and/or  In rare instances, security protocols could fail, causing a breach of personal health information.  Furthermore, I acknowledge that it is my responsibility to provide information about my medical history, conditions and care that is complete and accurate to the best of my ability. I acknowledge that Practitioner's advice, recommendations, and/or decision may be based on factors not within their control, such as incomplete or inaccurate data provided by me or distortions of diagnostic images or specimens that may result from electronic transmissions. I understand that the practice of medicine is not an exact science and that Practitioner makes no warranties or guarantees regarding treatment outcomes. I acknowledge that a copy of this consent can be made available to me via my patient portal [facility] MyChart), or I can request a printed copy by calling the office of [facility].    I understand that my insurance will be billed for this visit.   I have read or had this consent read to me. I understand the contents of this consent, which adequately explains the benefits and risks of the Services being provided via telemedicine.  I have been provided ample opportunity to ask questions regarding this consent and the Services and have had my questions answered to my satisfaction. I give my informed consent for the services to be provided through the use of telemedicine in my medical care

## 2024-06-04 NOTE — Telephone Encounter (Signed)
 Patient with diagnosis of atrial fibrillation on Xarelto  for anticoagulation.    Procedure:  removal of Interstim   Date of Surgery:  Clearance 07/09/24       CHA2DS2-VASc Score = 5   This indicates a 7.2% annual risk of stroke. The patient's score is based upon: CHF History: 0 HTN History: 1 Diabetes History: 0 Stroke History: 2 Vascular Disease History: 0 Age Score: 1 Gender Score: 1    CrCl 82 Platelet count 247  Patient has not had an Afib/aflutter ablation in the last 3 months, DCCV within the last 4 weeks or a watchman implanted in the last 45 days   Per office protocol, patient can hold Xarelto  for 2 days prior to procedure.   Patient will not need bridging with Lovenox (enoxaparin) around procedure.  **This guidance is not considered finalized until pre-operative APP has relayed final recommendations.**

## 2024-06-19 ENCOUNTER — Ambulatory Visit: Attending: Cardiology | Admitting: Emergency Medicine

## 2024-06-19 DIAGNOSIS — Z01810 Encounter for preprocedural cardiovascular examination: Secondary | ICD-10-CM

## 2024-06-19 NOTE — Progress Notes (Signed)
 Virtual Visit via Telephone Note   Because of Zollie Clemence Hucker co-morbid illnesses, she is at least at moderate risk for complications without adequate follow up.  This format is felt to be most appropriate for this patient at this time.  Due to technical limitations with video connection (technology), today's appointment will be conducted as an audio only telehealth visit, and BRENTLEE SCIARA verbally agreed to proceed in this manner.   All issues noted in this document were discussed and addressed.  No physical exam could be performed with this format.  Evaluation Performed:  Preoperative cardiovascular risk assessment _____________   Date:  06/19/2024   Patient ID:  Brandy Love, DOB 06-02-1956, MRN [id number] Patient Location:  Home Provider location:   Office  Primary Care Provider:  Arloa Elsie SAUNDERS, MD Primary Cardiologist:  Annabella Scarce, MD  Chief Complaint / Patient Profile   68 y.o. y/o female with a h/o paroxysmal atrial fibrillation, CVA, hypertension, asthma, gastric ulcer, GERD who is pending removal of InterStim on 07/09/2024 with [facility] and presents today for telephonic preoperative cardiovascular risk assessment.  History of Present Illness    SUNNI RICHARDSON is a 68 y.o. female who presents via audio/video conferencing for a telehealth visit today.  Pt was last seen in cardiology clinic on 04/04/2024 by Reche Finder, NP.  At that time AIZZA SANTIAGO was doing well.  The patient is now pending procedure as outlined above. Since her last visit, she denies chest pain, shortness of breath, lower extremity edema, fatigue, palpitations, melena, hematuria, hemoptysis, diaphoresis, weakness, presyncope, syncope, orthopnea, and PND.  Today patient is doing well without acute cardiovascular complaints.  Tells me she stays fairly active such as walking in [place] for exercise without exertional symptoms.  She has been struggling with some nausea, vomiting, diarrhea that  is being worked up by her primary.  Overall no chest pains, shortness of breath or exertional symptoms.  No symptoms to suggest active angina.  She is able to complete greater than 4 METS.  Past Medical History    Past Medical History:  Diagnosis Date   Anemia    Anxiety    Aortic atherosclerosis    Asthma    Bladder prolapse, female, acquired    Cerebral infarction, unspecified (HCC) 04/14/2022   Cerebral microvascular disease    Complex tear of medial meniscus of right knee 10/05/2023   Depression    Diastolic dysfunction    Edema of both lower extremities    Gastric ulcer    GERD (gastroesophageal reflux disease)    Glaucoma    Headache    Hepatic steatosis    Hiatal hernia    HTN (hypertension)    OAB (overactive bladder)    a.) has interstim implant   On dronedarone  therapy    On rivaroxaban  therapy    Ophthalmoplegic migraine    PAF (paroxysmal atrial fibrillation) (HCC)    a.) CHA2DS2VASc = 6 (age, sex, HTN, CVA/TIA x 2,vascular disease) as of 09/29/2023; b.) rate/rhythm maintained on oral dronaderone + metoprolol  succinate; chronically anticoagulated with rivaroxaban    Pneumonia    PONV (postoperative nausea and vomiting)    Pre-diabetes    Primary osteoarthritis of right knee    PSVT (paroxysmal supraventricular tachycardia)    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   TIA (transient ischemic attack)    a.) x 8 as of 09/29/2023 per patient report  Past Surgical History:  Procedure Laterality Date   ABDOMINAL HYSTERECTOMY  1993   ANTERIOR AND POSTERIOR REPAIR N/A 06/04/2019   Procedure: CYSTOSCOPY ANTERIOR (CYSTOCELE);  Surgeon: Gaston Hamilton, MD;  Location: [facility];  Service: Urology;  Laterality: N/A;   APPENDECTOMY     CATARACT EXTRACTION  2016   COLONOSCOPY WITH PROPOFOL  N/A 08/16/2023   Procedure: COLONOSCOPY WITH PROPOFOL ;  Surgeon: Therisa Bi, MD;   Location: [facility] ENDOSCOPY;  Service: Gastroenterology;  Laterality: N/A;   cyst opened and drained on ovary     CYSTECTOMY     1994 and 1982   ESOPHAGOGASTRODUODENOSCOPY     EYE SURGERY     INTERSTIM IMPLANT PLACEMENT  04/10/2012   Procedure: RENNA IMPLANT FIRST STAGE;  Surgeon: Hamilton DELENA Gaston, MD;  [facility];  Service: Urology;  Laterality: N/A;  rad tech ok per vicki at [facility]    INTERSTIM IMPLANT PLACEMENT  04/10/2012   Procedure: INTERSTIM IMPLANT SECOND STAGE;  Surgeon: Hamilton DELENA Gaston, MD;  Location: [facility];  Service: Urology;  Laterality: N/A;   LOWER LEG SOFT TISSUE TUMOR EXCISION  1994   cyst left ankle -involved muscle removed-limited mobility now   NASAL SEPTUM SURGERY     POLYPECTOMY  08/16/2023   Procedure: POLYPECTOMY;  Surgeon: Therisa Bi, MD;  Location: [facility] ENDOSCOPY;  Service: Gastroenterology;;   ANDREA SLING N/A 06/04/2019   Procedure: CARLOYN GLADE;  Surgeon: Gaston Hamilton, MD;  Location: [facility];  Service: Urology;  Laterality: N/A;   TOTAL KNEE ARTHROPLASTY Right 10/05/2023   Procedure: ARTHROPLASTY, KNEE, TOTAL;  Surgeon: Edie Norleen PARAS, MD;  Location: [facility] ORS;  Service: Orthopedics;  Laterality: Right;    Allergies  Allergies  Allergen Reactions   Shellfish Allergy Anaphylaxis, Nausea Only, Swelling and Other (See Comments)    Swelling of the throat   Sulfa Antibiotics Hives   Erythromycin Hives   Ibuprofen Other (See Comments)    Vertigo   Iodine     TOPICAL IODINE REACTION: rash  Other Reaction(s): Unknown   Adhesive [Tape] Rash and Other (See Comments)    Rash with transderm   Amlodipine Hives, Swelling and Rash   Betadine [Povidone Iodine] Rash and Other (See Comments)    ALL TOPICAL IODINES   Povidone-Iodine Other (See Comments) and Rash    ALL TOPICAL IODINES  Other Reaction(s): Unknown   Silicone Rash and Other (See Comments)    Rash with transderm   Spironolactone  Rash     Home Medications    Prior to Admission medications   Medication Sig Start Date End Date Taking? Authorizing Provider  acetaminophen  (TYLENOL ) 650 MG CR tablet Take 1,300 mg by mouth in the morning and at bedtime. Patient taking differently: Take 1,300 mg by mouth 3 (three) times daily.    [provider]  albuterol  (PROVENTIL  HFA;VENTOLIN  HFA) 108 (90 Base) MCG/ACT inhaler Inhale 2 puffs into the lungs every 6 (six) hours as needed for wheezing or shortness of breath.    [provider]  atorvastatin  (LIPITOR) 40 MG tablet Take 1 tablet (40 mg total) by mouth daily. 05/28/24   Raford Riggs, MD  clotrimazole-betamethasone  (LOTRISONE) cream Apply 1 Application topically 2 (two) times daily as needed (for irritation). Patient taking differently: Apply 1 Application topically as needed (for irritation). 12/15/21   [provider]  dronedarone  (MULTAQ ) 400 MG tablet Take 1 tablet (400 mg total) by mouth 2 (two) times daily with a meal. 02/05/24   Vannie Reche RAMAN,  NP  gabapentin  (NEURONTIN ) 400 MG capsule Take 400 mg by mouth 6 (six) times daily. 05/14/24 08/12/24  [provider]  hyoscyamine  (LEVSIN  SL) 0.125 MG SL tablet Place 0.125 mg under the tongue 2 (two) times daily as needed for cramping. Patient taking differently: Place 0.125 mg under the tongue as needed for cramping. 11/17/21   [provider]  lipase/protease/amylase (CREON) 36000 UNITS CPEP capsule Take 2 capsules before each meal ( 3 times a day) and take one capsule before each snack ( 2 times a day) 04/03/24   [provider]  meclizine (ANTIVERT) 12.5 MG tablet Take 12.5 mg by mouth 2 (two) times daily. Patient taking differently: Take 12.5 mg by mouth as needed. 12/08/23   [provider]  omeprazole (PRILOSEC) 40 MG capsule Take 40 mg by mouth daily before breakfast.    [provider]  sertraline  (ZOLOFT ) 100 MG tablet Take 200 mg by mouth daily. 04/29/22    [provider]  sucralfate  (CARAFATE ) 1 g tablet Take 1 g by mouth daily as needed (to coat the stomach). Patient taking differently: Take 1 g by mouth as needed (to coat the stomach). 11/16/21   [provider]  sulfamethoxazole-trimethoprim (BACTRIM DS) 800-160 MG tablet AS DIRECTED 05/23/24   [provider]  triamcinolone  (KENALOG ) 0.1 % paste 1 Application 2 (two) times daily as needed (for fever blisters- apply to the lips).    [provider]  valsartan -hydrochlorothiazide  (DIOVAN -HCT) 320-12.5 MG tablet Take 1 tablet by mouth daily. 04/04/24   Vannie Reche RAMAN, NP  XARELTO  20 MG TABS tablet TAKE 1 TABLET (20 MG TOTAL) BY MOUTH DAILY WITH SUPPER. 12/05/23   Raford Riggs, MD    Physical Exam    Vital Signs:  DARIELYS GIGLIA does not have vital signs available for review today.  Given telephonic nature of communication, physical exam is limited. AAOx3. NAD. Normal affect.  Speech and respirations are unlabored.  Accessory Clinical Findings    None  Assessment & Plan    1.  Preoperative Cardiovascular Risk Assessment: According to the Revised Cardiac Risk Index (RCRI), her Perioperative Risk of Major Cardiac Event is (%): 0.9. Her Functional Capacity in METs is: 5.07 according to the Duke Activity Status Index (DASI).  Therefore, based on ACC/AHA guidelines, patient would be at acceptable risk for the planned procedure without further cardiovascular testing. I will route this recommendation to the requesting party via Epic fax function.  The patient was advised that if she develops new symptoms prior to surgery to contact our office to arrange for a follow-up visit, and she verbalized understanding.  Per office protocol, patient can hold Xarelto  for 2 days prior to procedure.   Patient will not need bridging with Lovenox (enoxaparin) around procedure.  A copy of this note will be routed to requesting surgeon.  Time:   Today, I have spent 10  minutes with the patient with telehealth technology discussing medical history, symptoms, and management plan.     Lum LITTIE Louis, NP  06/19/2024, 10:11 AM

## 2024-06-25 NOTE — Progress Notes (Addendum)
 Date of COVID positive in last 90 days:  PCP - Elsie Lesches, MD Cardiologist - Annabella Scarce, MD Neurologist -  Arthea Farrow, MD  Cardiac clearance in Epic dated 06-19-24  Chest x-ray - N/A EKG - 05-16-24 Epic Stress Test - N/A ECHO - 04-15-22 Epic Long Term Monitor - 2025 Epic Cardiac Cath - N/A Pacemaker/ICD device last checked:N/A Spinal Cord Stimulator:N/A  Bowel Prep - N/A  Sleep Study - Yes,  CPAP -   Prediabetes Fasting Blood Sugar - N/A Checks Blood Sugar _____ times a day  Last dose of GLP1 agonist-  N/A GLP1 instructions:  Do not take after     Last dose of SGLT-2 inhibitors-  N/A SGLT-2 instructions:  Do not take after     Blood Thinner Instructions: Xarelto  (hold x2 days per clearance) N/A Last dose:   Time: Aspirin  Instructions:N/A Last Dose:  Activity level:  Can go up a flight of stairs and perform activities of daily living without stopping and without symptoms of chest pain or shortness of breath.  Able to exercise without symptoms  Unable to go up a flight of stairs without symptoms of     Anesthesia review: Afib, HTN, hx of CVA  Patient denies shortness of breath, fever, cough and chest pain at PAT appointment  Patient verbalized understanding of instructions that were given to them at the PAT appointment. Patient was also instructed that they will need to review over the PAT instructions again at home before surgery.

## 2024-06-25 NOTE — Patient Instructions (Addendum)
 SURGICAL WAITING ROOM VISITATION Patients having surgery or a procedure may have no more than 2 support people in the waiting area - these visitors may rotate.    Children under the age of 52 will not be allowed to visit due to the increase in respiratory illness  Children under the age of 22 must have an adult with them who is not the patient.  If the patient needs to stay at the hospital during part of their recovery, the visitor guidelines for inpatient rooms apply. Pre-op nurse will coordinate an appropriate time for 1 support person to accompany patient in pre-op.  This support person may not rotate.    Please refer to the [facility] website for the visitor guidelines for Inpatients (after your surgery is over and you are in a regular room).       Your procedure is scheduled on: 07-09-24   Report to [facility] Main Entrance    Report to admitting at 9:45 AM   Call this number if you have problems the morning of surgery [phone]   Do not eat food or drink liquids :After Midnight.           If you have questions, please contact your surgeons office.   FOLLOW ANY ADDITIONAL PRE OP INSTRUCTIONS YOU RECEIVED FROM YOUR SURGEON'S OFFICE!!!     Oral Hygiene is also important to reduce your risk of infection.                                    Remember - BRUSH YOUR TEETH THE MORNING OF SURGERY WITH YOUR REGULAR TOOTHPASTE   Do NOT smoke after Midnight   Take these medicines the morning of surgery with A SIP OF WATER :    Dronedarone  (Multaq )   Gabapentin    Omeprazole   Sertraline    Okay to use inhaler   If needed Tylenol , Meclizine   Hold Xarelto  2 days prior to surgery (do not take ater 07-06-24)  Stop all vitamins and herbal supplements 7 days before surgery                              You may not have any metal on your body including hair pins, jewelry, and body piercing             Do not wear make-up, lotions, powders, perfumes or deodorant  Do not  wear nail polish including gel and S&S, artificial/acrylic nails, or any other type of covering on natural nails including finger and toenails. If you have artificial nails, gel coating, etc. that needs to be removed by a nail salon please have this removed prior to surgery or surgery may need to be canceled/ delayed if the surgeon/ anesthesia feels like they are unable to be safely monitored.   Do not shave  48 hours prior to surgery.   Do not bring valuables to the hospital. [facility] IS NOT RESPONSIBLE   FOR VALUABLES.   Contacts, dentures or bridgework may not be worn into surgery.   DO NOT BRING YOUR HOME MEDICATIONS TO THE HOSPITAL. PHARMACY WILL DISPENSE MEDICATIONS LISTED ON YOUR MEDICATION LIST TO YOU DURING YOUR ADMISSION IN THE HOSPITAL!    Patients discharged on the day of surgery will not be allowed to drive home.  Someone NEEDS to stay with you for the first 24 hours after anesthesia.  Special Instructions: Bring a copy of your healthcare power of attorney and living will documents the day of surgery if you haven't scanned them before.              Please read over the following fact sheets you were given: IF YOU HAVE QUESTIONS ABOUT YOUR PRE-OP INSTRUCTIONS PLEASE CALL [phone] [place]  If you received a COVID test during your pre-op visit  it is requested that you wear a mask when out in public, stay away from anyone that may not be feeling well and notify your surgeon if you develop symptoms. If you test positive for Covid or have been in contact with anyone that has tested positive in the last 10 days please notify you surgeon.  [place] - Preparing for Surgery Before surgery, you can play an important role.  Because skin is not sterile, your skin needs to be as free of germs as possible.  You can reduce the number of germs on your skin by washing with CHG (chlorahexidine gluconate) soap before surgery.  CHG is an antiseptic cleaner which kills germs and bonds with the  skin to continue killing germs even after washing. Please DO NOT use if you have an allergy to CHG or antibacterial soaps.  If your skin becomes reddened/irritated stop using the CHG and inform your nurse when you arrive at Short Stay. Do not shave (including legs and underarms) for at least 48 hours prior to the first CHG shower.  You may shave your face/neck.  Please follow these instructions carefully:  1.  Shower with CHG Soap the night before surgery ONLY (DO NOT USE THE SOAP THE MORNING OF SURGERY).  2.  If you choose to wash your hair, wash your hair first as usual with your normal  shampoo.  3.  After you shampoo, rinse your hair and body thoroughly to remove the shampoo.                             4.  Use CHG as you would any other liquid soap.  You can apply chg directly to the skin and wash.  Gently with a scrungie or clean washcloth.  5.  Apply the CHG Soap to your body ONLY FROM THE NECK DOWN.   Do   not use on face/ open                           Wound or open sores. Avoid contact with eyes, ears mouth and   genitals (private parts).                       Wash face,  Genitals (private parts) with your normal soap.             6.  Wash thoroughly, paying special attention to the area where your    surgery  will be performed.  7.  Thoroughly rinse your body with warm water  from the neck down.  8.  DO NOT shower/wash with your normal soap after using and rinsing off the CHG Soap.                9.  Pat yourself dry with a clean towel.            10.  Wear clean pajamas.            11.  Place clean  sheets on your bed the night of your first shower and do not  sleep with pets. Day of Surgery : Do not apply any CHG, lotions/deodorants the morning of surgery.  Please wear clean clothes to the hospital/surgery center.  FAILURE TO FOLLOW THESE INSTRUCTIONS MAY RESULT IN THE CANCELLATION OF YOUR SURGERY  PATIENT SIGNATURE_________________________________  NURSE  SIGNATURE__________________________________  ________________________________________________________________________

## 2024-06-26 ENCOUNTER — Other Ambulatory Visit: Payer: Self-pay | Admitting: Cardiovascular Disease

## 2024-06-26 NOTE — Telephone Encounter (Signed)
 Prescription refill request for Xarelto  received.  Indication:afib Last office visit:9/25 Weight:93.4  kg Age:68 Scr:0.8  12/25 CrCl:99.24  ml/min  Prescription refilled

## 2024-06-27 ENCOUNTER — Other Ambulatory Visit: Payer: Self-pay | Admitting: Gastroenterology

## 2024-06-27 ENCOUNTER — Encounter (HOSPITAL_COMMUNITY): Payer: Self-pay

## 2024-06-27 ENCOUNTER — Other Ambulatory Visit: Payer: Self-pay

## 2024-06-27 ENCOUNTER — Encounter (HOSPITAL_COMMUNITY)
Admission: RE | Admit: 2024-06-27 | Discharge: 2024-06-27 | Disposition: A | Discharge status: Home / Self Care | Source: Ambulatory Visit | Attending: Urology | Admitting: Urology

## 2024-06-27 DIAGNOSIS — F32A Depression, unspecified: Secondary | ICD-10-CM | POA: Insufficient documentation

## 2024-06-27 DIAGNOSIS — Z8673 Personal history of transient ischemic attack (TIA), and cerebral infarction without residual deficits: Secondary | ICD-10-CM | POA: Insufficient documentation

## 2024-06-27 DIAGNOSIS — F419 Anxiety disorder, unspecified: Secondary | ICD-10-CM | POA: Insufficient documentation

## 2024-06-27 DIAGNOSIS — K449 Diaphragmatic hernia without obstruction or gangrene: Secondary | ICD-10-CM | POA: Diagnosis not present

## 2024-06-27 DIAGNOSIS — I110 Hypertensive heart disease with heart failure: Secondary | ICD-10-CM | POA: Diagnosis not present

## 2024-06-27 DIAGNOSIS — Z79899 Other long term (current) drug therapy: Secondary | ICD-10-CM | POA: Diagnosis not present

## 2024-06-27 DIAGNOSIS — J45909 Unspecified asthma, uncomplicated: Secondary | ICD-10-CM | POA: Insufficient documentation

## 2024-06-27 DIAGNOSIS — R109 Unspecified abdominal pain: Secondary | ICD-10-CM

## 2024-06-27 DIAGNOSIS — N3941 Urge incontinence: Secondary | ICD-10-CM | POA: Insufficient documentation

## 2024-06-27 DIAGNOSIS — I480 Paroxysmal atrial fibrillation: Secondary | ICD-10-CM | POA: Diagnosis not present

## 2024-06-27 DIAGNOSIS — K219 Gastro-esophageal reflux disease without esophagitis: Secondary | ICD-10-CM | POA: Diagnosis not present

## 2024-06-27 DIAGNOSIS — Z01818 Encounter for other preprocedural examination: Secondary | ICD-10-CM | POA: Insufficient documentation

## 2024-06-27 NOTE — Anesthesia Preprocedure Evaluation (Addendum)
"                                    Anesthesia Evaluation  Patient identified by MRN, date of birth, ID band Patient awake    Reviewed: Allergy & Precautions, NPO status , Patient's Chart, lab work & pertinent test results, reviewed documented beta blocker date and time   History of Anesthesia Complications (+) PONV and history of anesthetic complications  Airway Mallampati: II  TM Distance: >3 FB     Dental no notable dental hx.    Pulmonary neg shortness of breath, asthma , pneumonia, neg COPD, neg recent URI   breath sounds clear to auscultation       Cardiovascular hypertension, (-) angina (-) CAD and (-) Past MI + dysrhythmias Atrial Fibrillation (-) pacemaker(-) Cardiac Defibrillator  Rhythm:Regular Rate:Normal     Neuro/Psych  Headaches, neg Seizures PSYCHIATRIC DISORDERS Anxiety Depression    TIA Neuromuscular disease CVA, Residual Symptoms    GI/Hepatic hiatal hernia, PUD,GERD  ,,(+) neg Cirrhosis        Endo/Other    Renal/GU Renal disease     Musculoskeletal  (+) Arthritis ,    Abdominal   Peds  Hematology  (+) Blood dyscrasia, anemia   Anesthesia Other Findings   Reproductive/Obstetrics                              Anesthesia Physical Anesthesia Plan  ASA: 3  Anesthesia Plan: MAC   Post-op Pain Management:    Induction: Intravenous  PONV Risk Score and Plan: 3 and Propofol  infusion and Ondansetron   Airway Management Planned: Natural Airway and Nasal Cannula  Additional Equipment:   Intra-op Plan:   Post-operative Plan:   Informed Consent: I have reviewed the patients History and Physical, chart, labs and discussed the procedure including the risks, benefits and alternatives for the proposed anesthesia with the patient or authorized representative who has indicated his/her understanding and acceptance.     Dental advisory given  Plan Discussed with: CRNA  Anesthesia Plan Comments: (See PAT  note from 12/18 )         Anesthesia Quick Evaluation  "

## 2024-06-27 NOTE — Progress Notes (Signed)
 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  Most recent cardiac monitor in November 2025 did not show any episodes of A-fib.  Echo in 2023 showed normal LVEF with grade 1 diastolic dysfunction and no significant valvular disease.  Last seen in clinic on 05/16/2024 in HF clinic.  She is on Multaq  and Toprol .  She was advised to follow-up in 6 months.  Cardiac clearance addressed and 12/10 televisit:  Preoperative Cardiovascular Risk Assessment: According to the Revised Cardiac Risk Index (RCRI), her Perioperative Risk of Major Cardiac Event is (%): 0.9. Her Functional Capacity in METs is: 5.07 according to the Duke Activity Status Index (DASI). Therefore, based on ACC/AHA guidelines, patient would be at acceptable risk for the planned procedure without further cardiovascular testing. I will route this recommendation to the requesting party via Epic fax function. The patient was advised that if she develops new symptoms prior to surgery to contact our office to arrange for a follow-up visit, and she verbalized understanding. Per office protocol, patient can hold Xarelto  for 2 days prior to procedure.   Patient will not need bridging with Lovenox (enoxaparin) around procedure.   Patient follows with [facility]rology at [facility]. She was last seen on 05/14/24. She reports residual symptoms of  numbness and tingling in the left arm radiating to the left hand.    Last dose Xarelto : 12/27  VS: BP (!) 147/81   Pulse 71   Temp 36.9 C (Oral)   Resp 16   Ht 5' 7 (1.702 m)   Wt 87.2 kg   SpO2 97%   BMI 30.10 kg/m   PROVIDERS: Arloa Elsie SAUNDERS, MD   LABS: Labs reviewed: Acceptable for surgery. (all labs ordered are listed, but only abnormal results are displayed)  Labs Reviewed - No data to display   IMAGES:   EKG 05/16/2024:  Normal sinus rhythm Cannot rule out anterior infarct, age undetermined   Event monitor 05/14/24:   Quality: Fair.  Baseline artifact. Predominant rhythm: sinus bradycardia Average heart rate: 57 bpm Max heart rate: 128 bpm Min heart rate: 43 bpm Pauses >2.5 seconds: none   Rare (<1%) PACs and PVCs     Echo 04/15/2022:   IMPRESSIONS    1. Left ventricular ejection fraction, by estimation, is 55 to 60%. The left ventricle has normal function. The left ventricle has no regional wall motion abnormalities. Left ventricular diastolic parameters are consistent with Grade I diastolic dysfunction (impaired relaxation).  2. Right ventricular systolic function is normal. The right ventricular size is normal. Tricuspid regurgitation signal is inadequate for assessing PA pressure.  3. Left atrial size was mildly dilated.  4. The mitral valve is grossly normal. Trivial mitral valve regurgitation.  5. The aortic valve was not well visualized. Aortic valve regurgitation is not visualized. Aortic valve sclerosis/calcification is present, without any evidence of aortic stenosis.  6. The inferior vena cava is normal in size with greater than 50% respiratory variability, suggesting right  atrial pressure of 3 mmHg.  Comparison(s): Compared to prior TTE in 03/02/22, the MR appears less. Otherwise, there is no significant change. Past Medical History:  Diagnosis Date   Anemia    Anxiety    Aortic atherosclerosis    Asthma    Bladder prolapse, female, acquired    Cerebral infarction, unspecified (HCC)  04/14/2022   Cerebral microvascular disease    Complex tear of medial meniscus of right knee 10/05/2023   Depression    Diastolic dysfunction    Edema of both lower extremities    Gastric ulcer    GERD (gastroesophageal reflux disease)    Glaucoma    Headache    Hepatic steatosis    Hiatal hernia    HTN (hypertension)    OAB (overactive bladder)    a.) has interstim implant   On dronedarone  therapy    On rivaroxaban  therapy    Ophthalmoplegic migraine    PAF (paroxysmal atrial fibrillation) (HCC)    a.) CHA2DS2VASc = 6 (age, sex, HTN, CVA/TIA x 2,vascular disease) as of 09/29/2023; b.) rate/rhythm maintained on oral dronaderone + metoprolol  succinate; chronically anticoagulated with rivaroxaban    Pneumonia    PONV (postoperative nausea and vomiting)    Pre-diabetes    Primary osteoarthritis of right knee    PSVT (paroxysmal supraventricular tachycardia)    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   TIA (transient ischemic attack)    a.) x 8 as of 09/29/2023 per patient report    Past Surgical History:  Procedure Laterality Date   ABDOMINAL HYSTERECTOMY  1993   ANTERIOR AND POSTERIOR REPAIR N/A 06/04/2019   Procedure: CYSTOSCOPY ANTERIOR (CYSTOCELE);  Surgeon: Gaston Hamilton, MD;  Location: [facility];  Service: Urology;  Laterality: N/A;   APPENDECTOMY     CATARACT EXTRACTION  2016   COLONOSCOPY WITH PROPOFOL  N/A 08/16/2023   Procedure: COLONOSCOPY WITH PROPOFOL ;  Surgeon: Therisa Bi, MD;  Location: [facility] ENDOSCOPY;  Service: Gastroenterology;  Laterality: N/A;   cyst opened and drained on ovary     CYSTECTOMY     1994 and 1982   ESOPHAGOGASTRODUODENOSCOPY     EYE SURGERY     INTERSTIM IMPLANT PLACEMENT  04/10/2012   Procedure: RENNA IMPLANT FIRST STAGE;  Surgeon: Hamilton DELENA Gaston, MD;  Location: [facility];  Service: Urology;  Laterality: N/A;  rad tech ok  per vicki at [facility]    INTERSTIM IMPLANT PLACEMENT  04/10/2012   Procedure: INTERSTIM IMPLANT SECOND STAGE;  Surgeon: Hamilton DELENA Gaston, MD;  Location: [facility];  Service: Urology;  Laterality: N/A;   LOWER LEG SOFT TISSUE TUMOR EXCISION  1994   cyst left ankle -involved muscle removed-limited mobility now   NASAL SEPTUM SURGERY     POLYPECTOMY  08/16/2023   Procedure: POLYPECTOMY;  Surgeon: Therisa Bi, MD;  Location: [facility] ENDOSCOPY;  Service: Gastroenterology;;   ANDREA SLING N/A 06/04/2019   Procedure: CARLOYN GLADE;  Surgeon: Gaston Hamilton, MD;  Location[facility];  Service: Urology;  Laterality: N/A;   TOTAL KNEE ARTHROPLASTY Right 10/05/2023   Procedure: ARTHROPLASTY, KNEE, TOTAL;  Surgeon: Edie Norleen PARAS, MD;  Location: [facility] ORS;  Service: Orthopedics;  Laterality: Right;    MEDICATIONS:  acetaminophen  (TYLENOL ) 650 MG CR tablet   albuterol  (PROVENTIL  HFA;VENTOLIN  HFA) 108 (90 Base) MCG/ACT inhaler   atorvastatin  (LIPITOR) 40 MG tablet   benzonatate (TESSALON) 200 MG capsule   dronedarone  (MULTAQ ) 400  MG tablet   gabapentin  (NEURONTIN ) 400 MG capsule   hyoscyamine  (LEVSIN  SL) 0.125 MG SL tablet   lipase/protease/amylase (CREON) 36000 UNITS CPEP capsule   omeprazole (PRILOSEC) 40 MG capsule   ondansetron  (ZOFRAN -ODT) 4 MG disintegrating tablet   predniSONE (DELTASONE) 50 MG tablet   sertraline  (ZOLOFT ) 100 MG tablet   sodium fluoride (SF 5000 PLUS) 1.1 % CREA dental cream   triamcinolone  (KENALOG ) 0.1 % paste   valsartan -hydrochlorothiazide  (DIOVAN -HCT) 320-12.5 MG tablet   XARELTO  20 MG TABS tablet   No current facility-administered medications for this encounter.   Burnard CHRISTELLA Odis DEVONNA [facility] Short Stay/Anesthesiology [facility] Phone [phone] 06/27/2024 2:27 PM

## 2024-07-03 ENCOUNTER — Ambulatory Visit
Admission: RE | Admit: 2024-07-03 | Discharge: 2024-07-03 | Disposition: A | Discharge status: Home / Self Care | Source: Ambulatory Visit | Attending: Gastroenterology | Admitting: Gastroenterology

## 2024-07-03 DIAGNOSIS — R109 Unspecified abdominal pain: Secondary | ICD-10-CM | POA: Insufficient documentation

## 2024-07-03 MED ORDER — IOHEXOL 300 MG/ML  SOLN
100.0000 mL | Freq: Once | INTRAMUSCULAR | Status: AC | PRN
  Administered 2024-07-03: 100 mL via INTRAVENOUS

## 2024-07-07 ENCOUNTER — Ambulatory Visit: Payer: Self-pay | Admitting: Gastroenterology

## 2024-07-09 ENCOUNTER — Ambulatory Visit (HOSPITAL_COMMUNITY)
Admission: RE | Admit: 2024-07-09 | Discharge: 2024-07-09 | Disposition: A | Discharge status: Home / Self Care | Source: Ambulatory Visit | Attending: Urology | Admitting: Urology

## 2024-07-09 ENCOUNTER — Ambulatory Visit (HOSPITAL_COMMUNITY): Payer: Self-pay | Admitting: Medical

## 2024-07-09 ENCOUNTER — Other Ambulatory Visit: Payer: Self-pay

## 2024-07-09 ENCOUNTER — Ambulatory Visit (HOSPITAL_COMMUNITY)

## 2024-07-09 ENCOUNTER — Encounter (HOSPITAL_COMMUNITY)
Admission: RE | Disposition: A | Discharge status: Home / Self Care | Payer: Self-pay | Source: Ambulatory Visit | Attending: Urology

## 2024-07-09 ENCOUNTER — Encounter (HOSPITAL_COMMUNITY): Payer: Self-pay | Admitting: Urology

## 2024-07-09 DIAGNOSIS — Y758 Miscellaneous neurological devices associated with adverse incidents, not elsewhere classified: Secondary | ICD-10-CM | POA: Diagnosis not present

## 2024-07-09 DIAGNOSIS — F418 Other specified anxiety disorders: Secondary | ICD-10-CM | POA: Diagnosis not present

## 2024-07-09 DIAGNOSIS — I10 Essential (primary) hypertension: Secondary | ICD-10-CM | POA: Insufficient documentation

## 2024-07-09 DIAGNOSIS — T85890A Other specified complication of nervous system prosthetic devices, implants and grafts, initial encounter: Secondary | ICD-10-CM | POA: Diagnosis not present

## 2024-07-09 DIAGNOSIS — N3941 Urge incontinence: Secondary | ICD-10-CM | POA: Diagnosis present

## 2024-07-09 DIAGNOSIS — J45909 Unspecified asthma, uncomplicated: Secondary | ICD-10-CM | POA: Insufficient documentation

## 2024-07-09 DIAGNOSIS — T83190A Other mechanical complication of urinary electronic stimulator device, initial encounter: Secondary | ICD-10-CM | POA: Diagnosis not present

## 2024-07-09 DIAGNOSIS — I4891 Unspecified atrial fibrillation: Secondary | ICD-10-CM | POA: Diagnosis not present

## 2024-07-09 HISTORY — PX: INTERSTIM IMPLANT REMOVAL: SHX5131

## 2024-07-09 SURGERY — REMOVAL, NEUROSTIMULATOR, SACRAL
Anesthesia: Monitor Anesthesia Care | Site: Back

## 2024-07-09 MED ORDER — OXYCODONE HCL 5 MG PO TABS: 5.0000 mg | ORAL_TABLET | Freq: Once | ORAL | Status: DC | PRN

## 2024-07-09 MED ORDER — KETOROLAC TROMETHAMINE 30 MG/ML IJ SOLN
INTRAMUSCULAR | Status: AC
  Filled 2024-07-09: qty 1

## 2024-07-09 MED ORDER — FENTANYL CITRATE (PF) 50 MCG/ML IJ SOSY: 25.0000 ug | PREFILLED_SYRINGE | INTRAMUSCULAR | Status: DC | PRN

## 2024-07-09 MED ORDER — ONDANSETRON HCL 4 MG/2ML IJ SOLN
INTRAMUSCULAR | Status: DC | PRN
  Administered 2024-07-09: 4 mg via INTRAVENOUS

## 2024-07-09 MED ORDER — OXYCODONE HCL 5 MG/5ML PO SOLN: 5.0000 mg | Freq: Once | ORAL | Status: DC | PRN

## 2024-07-09 MED ORDER — KETOROLAC TROMETHAMINE 30 MG/ML IJ SOLN
INTRAMUSCULAR | Status: DC | PRN
  Administered 2024-07-09: 30 mg via INTRAVENOUS

## 2024-07-09 MED ORDER — CHLORHEXIDINE GLUCONATE 0.12 % MT SOLN
15.0000 mL | Freq: Once | OROMUCOSAL | Status: AC
  Administered 2024-07-09: 15 mL via OROMUCOSAL

## 2024-07-09 MED ORDER — ORAL CARE MOUTH RINSE: 15.0000 mL | Freq: Once | OROMUCOSAL | Status: AC

## 2024-07-09 MED ORDER — ACETAMINOPHEN 10 MG/ML IV SOLN: 1000.0000 mg | Freq: Once | INTRAVENOUS | Status: DC | PRN

## 2024-07-09 MED ORDER — STERILE WATER FOR IRRIGATION IR SOLN
Status: DC | PRN
  Administered 2024-07-09: 1000 mL

## 2024-07-09 MED ORDER — PROPOFOL 1000 MG/100ML IV EMUL
INTRAVENOUS | Status: AC
  Filled 2024-07-09: qty 100

## 2024-07-09 MED ORDER — PROPOFOL 500 MG/50ML IV EMUL
INTRAVENOUS | Status: DC | PRN
  Administered 2024-07-09: 100 ug/kg/min via INTRAVENOUS
  Administered 2024-07-09: 50 mg via INTRAVENOUS

## 2024-07-09 MED ORDER — LIDOCAINE-EPINEPHRINE (PF) 1 %-1:200000 IJ SOLN
INTRAMUSCULAR | Status: AC
  Filled 2024-07-09: qty 30

## 2024-07-09 MED ORDER — CEFAZOLIN SODIUM-DEXTROSE 2-4 GM/100ML-% IV SOLN
2.0000 g | INTRAVENOUS | Status: AC
  Administered 2024-07-09: 2 g via INTRAVENOUS
  Filled 2024-07-09: qty 100

## 2024-07-09 MED ORDER — LIDOCAINE-EPINEPHRINE (PF) 1 %-1:200000 IJ SOLN
INTRAMUSCULAR | Status: DC | PRN
  Administered 2024-07-09: 16 mL

## 2024-07-09 MED ORDER — DEXAMETHASONE SOD PHOSPHATE PF 10 MG/ML IJ SOLN
INTRAMUSCULAR | Status: DC | PRN
  Administered 2024-07-09: 8 mg via INTRAVENOUS

## 2024-07-09 MED ORDER — PROPOFOL 10 MG/ML IV BOLUS
INTRAVENOUS | Status: AC
  Filled 2024-07-09: qty 20

## 2024-07-09 MED ORDER — ONDANSETRON HCL 4 MG/2ML IJ SOLN: 4.0000 mg | Freq: Once | INTRAMUSCULAR | Status: DC | PRN

## 2024-07-09 MED ORDER — HYDROCODONE-ACETAMINOPHEN 5-325 MG PO TABS: 1.0000 | ORAL_TABLET | Freq: Four times a day (QID) | ORAL | 0 refills | Status: AC | PRN

## 2024-07-09 MED ORDER — LACTATED RINGERS IV SOLN: INTRAVENOUS | Status: DC

## 2024-07-09 MED ORDER — ONDANSETRON HCL 4 MG/2ML IJ SOLN
INTRAMUSCULAR | Status: AC
  Filled 2024-07-09: qty 2

## 2024-07-09 SURGICAL SUPPLY — 25 items
BAG COUNTER SPONGE SURGICOUNT (BAG) IMPLANT
CHLORAPREP W/TINT 26 (MISCELLANEOUS) ×1 IMPLANT
DERMABOND ADVANCED .7 DNX12 (GAUZE/BANDAGES/DRESSINGS) ×1 IMPLANT
DRAPE 3/4 80X56 (DRAPES) ×1 IMPLANT
DRAPE INCISE 23X17 STRL (DRAPES) ×1 IMPLANT
DRAPE INCISE IOBAN 22X17 STRL (DRAPES) IMPLANT
DRAPE LAPAROSCOPIC ABDOMINAL (DRAPES) ×1 IMPLANT
DRSG TEGADERM 2-3/8X2-3/4 SM (GAUZE/BANDAGES/DRESSINGS) IMPLANT
DRSG TEGADERM 4X10 (GAUZE/BANDAGES/DRESSINGS) ×1 IMPLANT
DRSG TEGADERM 4X4.75 (GAUZE/BANDAGES/DRESSINGS) IMPLANT
DRSG TELFA 3X8 NADH STRL (GAUZE/BANDAGES/DRESSINGS) ×1 IMPLANT
GAUZE 4X4 16PLY ~~LOC~~+RFID DBL (SPONGE) ×1 IMPLANT
GLOVE SURG LX STRL 7.5 STRW (GLOVE) ×1 IMPLANT
GOWN STRL REUS W/ TWL XL LVL3 (GOWN DISPOSABLE) ×1 IMPLANT
KIT BASIN OR (CUSTOM PROCEDURE TRAY) ×1 IMPLANT
NEEDLE HYPO 25X1 1.5 SAFETY (NEEDLE) IMPLANT
NS IRRIG 1000ML POUR BTL (IV SOLUTION) ×1 IMPLANT
PACK GENERAL/GYN (CUSTOM PROCEDURE TRAY) ×1 IMPLANT
SPIKE FLUID TRANSFER (MISCELLANEOUS) IMPLANT
SUT MNCRL AB 4-0 PS2 18 (SUTURE) ×1 IMPLANT
SUT VIC AB 2-0 UR6 27 (SUTURE) ×2 IMPLANT
SUT VIC AB 3-0 SH 27XBRD (SUTURE) ×2 IMPLANT
SUT VIC AB 4-0 PS2 27 (SUTURE) ×1 IMPLANT
SYR CONTROL 10ML LL (SYRINGE) IMPLANT
TOWEL OR DSP ST BLU DLX 10/PK (DISPOSABLE) ×2 IMPLANT

## 2024-07-09 NOTE — Anesthesia Postprocedure Evaluation (Signed)
"   Anesthesia Post Note  Patient: Brandy Love  Procedure(s) Performed: REMOVAL, NEUROSTIMULATOR, SACRAL (Back)     Patient location during evaluation: PACU Anesthesia Type: MAC Level of consciousness: awake and alert Pain management: pain level controlled Vital Signs Assessment: post-procedure vital signs reviewed and stable Respiratory status: spontaneous breathing, nonlabored ventilation, respiratory function stable and patient connected to nasal cannula oxygen Cardiovascular status: stable and blood pressure returned to baseline Postop Assessment: no apparent nausea or vomiting Anesthetic complications: no   No notable events documented.  Last Vitals:  Vitals:   07/09/24 1255 07/09/24 1347  BP: (!) 149/80 (!) 163/77  Pulse: 66 64  Resp:  16  Temp: 36.6 C 36.6 C  SpO2:  96%    Last Pain:  Vitals:   07/09/24 1315  TempSrc:   PainSc: 0-No pain                 [name] Cornea      "

## 2024-07-09 NOTE — Op Note (Signed)
 Preoperative diagnosis: Refractory urgency incontinence and malfunctioning InterStim Post Operative diagnosis: Refractory urgency incontinence and malfunctioning InterStim Surgery: Removal of InterStim Surgeon: Dr. Glendia Meta Kroenke  The patient has the above diagnosis and consented to the above procedure.  Lead was placed more than a decade ago.  IPG was changed 3 to 4 years ago.  It was easy to see the IPG and lead on the right side with fluoroscopy and by palpation  I marked the right upper buttock incision.  10 cc of lidocaine  epinephrine  mixture was utilized.  I dissected down through the thin subcutaneous tissue opening up the pseudocapsule.  The IPG was removed.  Hemostat was placed across the lead and lead was cut distal removing the IPG.  Pseudocapsule was opened up posteriorly quite widely with cutting current  Under fluoroscopy and noting the right midline buttock incision I was surprised how high the lead was and that the incision itself was away from the lead.  I carefully marked a 3 cm incision down little bit lower and a little bit more lateral on the right side.  I used 6 cc lidocaine  epinephrine  mixture.  I dissected down with scalpel and easily found the lead bring it from lateral to medial.  I did a lot of finger and a little bit of sharp dissection to traced the lead down near the bony table.  It was very deep with a long path fluoroscopically and also surgically  Hemostat was placed across the lead with my usual technique near the level of bone table and the lead was removed in total easily.  There was no bleeding  The right midline incision was closed with 3-0 Vicryl subcutaneous followed by 4-0 Vicryl interrupted skin suture.  Right buttock incision was closed with 3-0 Vicryl subcutaneous followed by 4-0 Monocryl.  Sterile dressing was applied.  She will be followed as per protocol

## 2024-07-09 NOTE — Interval H&P Note (Signed)
 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  The various methods of treatment have been discussed with the patient and family. After consideration of risks, benefits and other options for treatment, the patient has consented to  Procedures: REMOVAL, NEUROSTIMULATOR, SACRAL (N/A) as a surgical intervention.  The patient's history has been reviewed, patient examined, no change in status, stable for surgery.  I have reviewed the patient's chart and labs.  Questions were answered to the patient's satisfaction.     Lorenz Donley A Sonakshi Rolland

## 2024-07-09 NOTE — Anesthesia Procedure Notes (Signed)
 Procedure Name: MAC Date/Time: 07/09/2024 12:00 PM  Performed by: Nada Corean CROME, CRNAPre-anesthesia Checklist: Patient identified, Emergency Drugs available, Suction available, Patient being monitored and Timeout performed Patient Re-evaluated:Patient Re-evaluated prior to induction Oxygen Delivery Method: Simple face mask Preoxygenation: Pre-oxygenation with 100% oxygen Induction Type: IV induction Placement Confirmation: positive ETCO2 Dental Injury: Teeth and Oropharynx as per pre-operative assessment

## 2024-07-09 NOTE — Discharge Instructions (Signed)
 I have reviewed discharge instructions in detail with the patient. They will follow-up with me or their physician as scheduled. My nurse will also be calling the patients as per protocol.

## 2024-07-09 NOTE — Transfer of Care (Signed)
 Immediate Anesthesia Transfer of Care Note  Patient: Brandy Love  Procedure(s) Performed: REMOVAL, NEUROSTIMULATOR, SACRAL (Back)  Patient Location: PACU  Anesthesia Type:MAC  Level of Consciousness: awake, alert , oriented, and patient cooperative  Airway & Oxygen Therapy: Patient Spontanous Breathing  Post-op Assessment: Report given to RN and Post -op Vital signs reviewed and stable  Post vital signs: Reviewed and stable  Last Vitals:  Vitals Value Taken Time  BP 149/80 07/09/24 12:53  Temp    Pulse 68 07/09/24 12:55  Resp 17 07/09/24 12:55  SpO2 98 % 07/09/24 12:55  Vitals shown include unfiled device data.  Last Pain:  Vitals:   07/09/24 1024  TempSrc:   PainSc: 0-No pain      Patients Stated Pain Goal: 5 (07/09/24 1024)  Complications: No notable events documented.

## 2024-07-10 ENCOUNTER — Encounter (HOSPITAL_COMMUNITY): Payer: Self-pay | Admitting: Urology

## 2024-11-14 ENCOUNTER — Ambulatory Visit (HOSPITAL_COMMUNITY): Admitting: Physician Assistant
